# Patient Record
Sex: Male | Born: 1940 | Race: White | Hispanic: No | Smoking: Never smoker
Health system: Southern US, Community
[De-identification: ages and names within clinical notes are randomized; demographics above are authoritative.]

## PROBLEM LIST (undated history)

## (undated) DIAGNOSIS — C61 Malignant neoplasm of prostate: Secondary | ICD-10-CM

## (undated) DIAGNOSIS — M899 Disorder of bone, unspecified: Secondary | ICD-10-CM

## (undated) DIAGNOSIS — H269 Unspecified cataract: Secondary | ICD-10-CM

## (undated) DIAGNOSIS — R0781 Pleurodynia: Secondary | ICD-10-CM

## (undated) DIAGNOSIS — E119 Type 2 diabetes mellitus without complications: Secondary | ICD-10-CM

## (undated) DIAGNOSIS — C801 Malignant (primary) neoplasm, unspecified: Secondary | ICD-10-CM

## (undated) HISTORY — PX: PORTACATH PLACEMENT: SHX2246

## (undated) HISTORY — PX: OTHER SURGICAL HISTORY: SHX169

## (undated) HISTORY — PX: SPLENECTOMY: SUR1306

## (undated) HISTORY — PX: CHOLECYSTECTOMY: SHX55

---

## 2012-02-05 LAB — AMB POC URINALYSIS DIP STICK AUTO W/O MICRO
Bilirubin (UA POC): NEGATIVE
Blood (UA POC): NEGATIVE
Glucose (UA POC): NEGATIVE
Ketones (UA POC): NEGATIVE
Nitrites (UA POC): NEGATIVE
Protein (UA POC): NEGATIVE mg/dL
Specific gravity (UA POC): 1.02 (ref 1.001–1.035)
Urobilinogen (UA POC): 0.2 (ref 0.2–1)
pH (UA POC): 5.5 (ref 4.6–8.0)

## 2012-02-05 LAB — AMB POC PVR, MEAS,POST-VOID RES,US,NON-IMAGING: PVR: 26 cc

## 2012-02-05 NOTE — Addendum Note (Signed)
Addended by: [name] on: 02/05/2012 02:43 PM     Modules accepted: Orders

## 2012-02-05 NOTE — Progress Notes (Signed)
CC: Elevated PSA    HPI:     John Higgins is a 71 y.o. male who was referred by [facility] for evaluation of an elevated PSA at 5.0. Last PSA levels were normal per patient. There may be a family history of prostate cancer but not certain. The patient denies frequency, urgency, dysuria, hematuria or fever/chills. No weight loss or bone pain.       AUA Assessment Score:  AUA Score: 12 ;    AUA Bother Rating: Mixed-about equally satisfied      No past medical history on file.    No past surgical history on file.     Current Outpatient Prescriptions   Medication Sig Dispense Refill   ??? LEVOTHYROXINE SODIUM (LEVOTHYROXINE PO) Take  by mouth.       ??? losartan (COZAAR) 50 mg tablet Take  by mouth daily.       ??? bisoprolol-hydrochlorothiazide (ZIAC) 5-6.25 mg per tablet Take 1 Tab by mouth daily.       ??? sitaGLIPtin-metFORMIN (JANUMET) 50-500 mg per tablet Take 1 Tab by mouth two (2) times daily (with meals).       ??? aspirin 81 mg tablet Take 81 mg by mouth.           Allergies:  No Known Allergies      Past History and ROS:  I reviewed the past medical, surgical and social history as well as ROS in detail. This assessment form has been scanned and is available to view in media.    PE:  Ht 5\' 9"  (1.753 m)   Wt 175 lb (79.379 kg)   BMI 25.84 kg/m2  Constitutional: Well developed, well nourished in no acute distress.  CV:  No peripheral swelling noted.  Respiratory: No respiratory distress or difficulties breathing.  GI: No abdominal masses or tenderness noted. No inguinal hernia appreciated.  GU Male:     Prostate: Perineum normal. Sphincter with good tone, Rectum with no hemo  rrhoids, fissures or masses, Prostate smooth, symmetric, moderate in size and seminal vesicles normal to palpation.  Scrotum:  No scrotal rash or lesions noted.  Normal bilateral testes and epididymii.   Penis: Urethral meatus normal in location and size. No urethral discharge.  Skin: Normal color and texture and No rashes or erythema noted   Neuro/Psych:  Alert and Oriented x3. Affect appropriate.   Lymphatic:   No enlarged inguinal lymph nodes.           LAB:    Results for orders placed in visit on 02/05/12   AMB POC URINALYSIS DIP STICK AUTO W/O MICRO       Component Value Range    Color Yellow  (none)    Clarity Clear  (none)    Glucose Negative  (none)    Bilirubin Negative  (none)    Ketones Negative  (none)    Spec.Grav. 1.020  1.001 - 1.035    Blood Negative  (none)    pH 5.5  4.6 - 8.0    Protein, Urine Negative  Negative mg/dL    Urobilinogen 0.2 mg/dL  0.2 - 1    Nitrites Negative  (none)    Leukocyte esterase Trace  (none)   AMB POC PVR, MEAS,POST-VOID RES,US,NON-IMAGING       Component Value Range    PVR 26             IMP:      1. PSA elevation    2. Incomplete bladder  emptying    3. BPH (benign prostatic hyperplasia)              SUMMARY STATEMENT AND PLAN:     ?? Repeat PSA today  ?? BPH and is on Flomax but does not like side effects  ?? Not greatly bothered by symptoms now and will stop medication for BPH          Ariam Mol A. Letitia Neri, MD, FACS

## 2012-02-06 LAB — PSA, DIAGNOSTIC (PROSTATE SPECIFIC AG): Prostate Specific Ag: 5.1 ng/mL — AB (ref 0.00–4.00)

## 2012-02-07 MED ORDER — CIPROFLOXACIN 500 MG TAB
500 mg | ORAL_TABLET | ORAL | Status: DC
Start: 2012-02-07 — End: 2015-04-19

## 2012-02-07 NOTE — Progress Notes (Signed)
Quick Note:    Please inform PSA remains elevated at 5.1. Recommend biopsy as had discussed.  ______

## 2012-02-07 NOTE — Telephone Encounter (Signed)
Called patient and informed him of his PSA results, scheduled him for a TRUS Bx, meds ordered, instructions given and paperwork mailed to his address on file.  [name]

## 2012-02-28 LAB — AMB POC URINALYSIS DIP STICK AUTO W/O MICRO
Bilirubin (UA POC): NEGATIVE
Blood (UA POC): NEGATIVE
Ketones (UA POC): NEGATIVE
Leukocyte esterase (UA POC): NEGATIVE
Nitrites (UA POC): NEGATIVE
Protein (UA POC): NEGATIVE mg/dL
Specific gravity (UA POC): 1.03 (ref 1.001–1.035)
Urobilinogen (UA POC): 0.2 (ref 0.2–1)
pH (UA POC): 5 (ref 4.6–8.0)

## 2012-02-28 MED ORDER — GENTAMICIN 40 MG/ML IJ SOLN
40 mg/mL | Freq: Once | INTRAMUSCULAR | Status: AC
Start: 2012-02-28 — End: 2012-02-28

## 2012-03-01 NOTE — Progress Notes (Signed)
Transrectal Ultrasound and Biopsy of Prostate        Patient: John Higgins                                                             Date: 03/01/2012    Indications: Elevated PSA    Procedure:  The patient is set for a prostate biopsy. We have reviewed the indications and risks and benefits and options in great detail. He elects to proceed and accepts each of these. These include but are not limited to bleeding, infection, sepsis, death, prolonged hematuria and/or hematospermia, erectile dysfunction as well as others. All questions were answered and he agrees to proceed.  Stated he took po Cipro, enema, had IM Gentamicin and not on blood thinners or ASA.    He was placed in the left lateral decubitus position with knees drawn up. Anesthesia: Prostate Block - 2% Xylocaine 10cc was injected bilaterally and 2% Xylocaine jelly per rectum    An endorectal probe was placed in the rectum.  Transverse and sagittal images of the prostate and seminal vesicles were obtained.  Using the needle guide on the probe, a biopsy needle was used to obtain biopsies of the prostate.  If seen, abnormally appearing areas were targeted along with systematic biopsies from each side.  Tissue cores were placed in formalin jars and sent for pathologic review.    The probe was removed and the patient was observed.  He was advised to finish his prescribed antibiotics and avoid strenuous physical and sexual activity for several days.  He was told to call our office if he developed fever, excessive or persistent blood in the urine/stool, or difficulty voiding.  We arranged follow-up so that we could review the results of this biopsy.    Interpretation:  Prostate Exam: 1+ (30gms)  Prostatic Volume: 42.3 grams  Prostatic capsule: Normal  Transition Zone: normal  Peripheral Zone: normal  Seminal Vesicles: Normal    Biopsy-Number of cores:              Base Mid Apex Lat Base Lat Mid Lat Apex   Right Side 1 1 1 1 1 1    Left Side 1 1 1 1 1 1                                Patriciaann Rabanal A. Letitia Neri, MD, FACS

## 2012-03-05 NOTE — Progress Notes (Signed)
Quick Note:    Please inform no cancer on biopsy. Should come in in 6 months again.  ______

## 2012-03-06 NOTE — Telephone Encounter (Signed)
Called patient and infomed him of his Bx results also told him to follow up in 6 months.  [name]

## 2015-04-19 ENCOUNTER — Ambulatory Visit: Admit: 2015-04-19 | Discharge: 2015-04-19 | Payer: MEDICARE | Attending: Urology | Primary: Family Medicine

## 2015-04-19 DIAGNOSIS — R972 Elevated prostate specific antigen [PSA]: Secondary | ICD-10-CM

## 2015-04-19 LAB — AMB POC URINALYSIS DIP STICK AUTO W/O MICRO
Bilirubin (UA POC): NEGATIVE
Blood (UA POC): NEGATIVE
Glucose (UA POC): NEGATIVE
Ketones (UA POC): NEGATIVE
Leukocyte esterase (UA POC): NEGATIVE
Nitrites (UA POC): NEGATIVE
Protein (UA POC): NEGATIVE mg/dL
Specific gravity (UA POC): 1.025 (ref 1.001–1.035)
Urobilinogen (UA POC): 0.2 (ref 0.2–1)
pH (UA POC): 5.5 (ref 4.6–8.0)

## 2015-04-19 LAB — PSA (TOTAL, REFLEX TO FREE): Prostate Specific Ag: 6.09 ng/mL — ABNORMAL HIGH (ref 0.00–4.00)

## 2015-04-19 LAB — PSA (FREE)
PSA, % Free: 24 %
PSA, Free: 1.49 ng/mL

## 2015-04-19 NOTE — Progress Notes (Signed)
Impression:    ICD-10-CM ICD-9-CM    1. Elevated PSA R97.2 790.93 AMB POC URINALYSIS DIP STICK AUTO W/O MICRO      PR COLLECTION VENOUS BLOOD,VENIPUNCTURE      PSA (TOTAL, REFLEX TO FREE)      CANCELED: PROSTATE SPECIFIC ANTIGEN, TOTAL (PSA)       Elevated PSA s/p negative biopsy by Dr Maida Sale in 2013 when PSA 5.1. Prostate vol 42.3 gms. PSA now 7.1 per referral note, do not have actual records  Significant CAD history - CABG 2005, multiple coronary stents most recently 05/2014, managed by Dr Jerline Pain  TIA 2013  DM  HTN    Plan:  No recent PSAs to review, repeat PSA today.  If PSA stable and DRE benign, recommend observation and follow up in 1 year    We reviewed the implications of an elevated PSA and the uncertainty surrounding it. In general, a man's PSA increases with age and is produced by both normal and cancerous prostate tissue. An elevated PSA can be a manifestation of one or more of the following items: prostate enlargement, prostate infection, or prostate cancer. Management of an elevated PSA can include observation or prostate biopsy and we discussed this in detail.      Chief Complaint   Patient presents with   ??? Elevated PSA       HPI: Pt returns to the office for f/u of elevated PSA. Referral form noted PSA of 7.1.  He had a previous prostate biopsy in 2013 by Dr Maida Sale for PSA of 5.1.  Biopsy was negative, one area of inflammation, volume 42gms.   No known FH of prostate cancer.  He takes Flomax for weak stream with improvement.    He denies dysuria, hematuria, changes in bowel habits or abdominal/back/bone pain.     Past Medical History   Diagnosis Date   ??? HTN (hypertension)    ??? CAD (coronary artery disease) 2005, 2015     CABG 2005, multiple stents, most recently in 05/2014   ??? TIA (transient ischemic attack) 2013   ??? DM (diabetes mellitus screen)    ??? Liver disorder        History reviewed. No pertinent past surgical history.    No Known Allergies      Current outpatient prescriptions:    ???  gabapentin (NEURONTIN) 300 mg capsule, , Disp: , Rfl:   ???  glimepiride (AMARYL) 1 mg tablet, , Disp: , Rfl:   ???  simvastatin (ZOCOR) 40 mg tablet, , Disp: , Rfl:   ???  tamsulosin (FLOMAX) 0.4 mg capsule, , Disp: , Rfl:   ???  clopidogrel (PLAVIX) 75 mg tablet, , Disp: , Rfl:   ???  METFORMIN HCL (METFORMIN PO), Take  by mouth., Disp: , Rfl:   ???  LEVOTHYROXINE SODIUM (LEVOTHYROXINE PO), Take  by mouth., Disp: , Rfl:   ???  losartan (COZAAR) 50 mg tablet, Take  by mouth daily., Disp: , Rfl:   ???  bisoprolol-hydrochlorothiazide (ZIAC) 5-6.25 mg per tablet, Take 1 Tab by mouth daily., Disp: , Rfl:   ???  aspirin 81 mg tablet, Take 81 mg by mouth., Disp: , Rfl:     History reviewed. No pertinent family history.  History     Social History   ??? Marital Status: SINGLE     Spouse Name: N/A   ??? Number of Children: N/A   ??? Years of Education: N/A     Social History Main Topics   ??? Smoking  status: Never Smoker    ??? Smokeless tobacco: Never Used   ??? Alcohol Use: Yes   ??? Drug Use: Not on file   ??? Sexual Activity: Not on file     Other Topics Concern   ??? None     Social History Narrative         ROS:  Review of Systems  Constitutional: Fever: No  Skin: Rash: No  HEENT: Hearing difficulty: No  Eyes: Blurred vision: No  Cardiovascular: Chest pain: No  Respiratory: Shortness of breath: No  Gastrointestinal: Nausea/vomiting: No  Musculoskeletal: Back pain: No  Neurological: Weakness: No  Psychological: Memory loss: No  Comments/additional findings:     PE:  BP 120/70 mmHg   Ht 5\' 9"  (1.753 m)   Wt 165 lb (74.844 kg)   BMI 24.36 kg/m2  Gen: WNWD, NAD  Resp: Symmetrical effort, non-labored  CV: No peripheral edema  GU: DRE exam: Prostate 2+/4, smooth, symmetrical, anodular  Skin: no jaundice  Ext: warm and perfused  Neuro: A&Ox3    Results for orders placed or performed in visit on 04/19/15   AMB POC URINALYSIS DIP STICK AUTO W/O MICRO   Result Value Ref Range    Color (UA POC)      Clarity (UA POC)      Glucose (UA POC) Negative Negative     Bilirubin (UA POC) Negative Negative    Ketones (UA POC) Negative Negative    Specific gravity (UA POC) 1.025 1.001 - 1.035    Blood (UA POC) Negative Negative    pH (UA POC) 5.5 4.6 - 8.0    Protein (UA POC) Negative Negative mg/dL    Urobilinogen (UA POC) 0.2 mg/dL 0.2 - 1    Nitrites (UA POC) Negative Negative    Leukocyte esterase (UA POC) Negative Negative       Charlene Reyes PA-C    I was present during visit.  I performed the pertinent parts of the exam and they are as documented above.  We  discussed the assessment and plan,  and I concur with plan.    Herbie [place] Charlene Detter MD   Urologic Oncologist  [facility] Hermelinda [facility]us MD Professorship  Associate Professor of Urology  [facility]  Office [phone] Ext [phone]

## 2015-04-26 NOTE — Progress Notes (Signed)
Quick Note:        Letter sent        ______

## 2015-08-11 NOTE — Other (Signed)
[place]    How to prepare:    Nothing to eat or drink after midnight the night before surgery, unless otherwise specified.  This includes gum and mints.  You may brush your teeth in the morning, however do not swallow any water.  Please remove all jewelry and body piercings and leave all valuables at home.  Remove nail polish and contact lenses.  You MUST make arrangements for a responsible adult to take you home after your surgery, analgesia or sedation.  It is strongly suggested that a responsible adult stay with you during the first 24 hours.  Please follow your surgeon's instructions regarding the time you need to arrive at [facility].  If you are late [place] due to any unforeseen circumstance or your are ill the morning of surgery and need to cancel or be delayed, please call [facility] at [phone] or [phone]  If you need to cancel your surgery prior to the day of surgery, call your surgeon.      What to bring with you:    Paperwork from your doctor's office that you have been given.  Insurance cards, a picture ID and a method to pay your insurance co-pay.  Wear comfortable, loose fitting clothing that will be easy for you to put back on after surgery.  If you are having cataract surgery, please bring all the eye drops that you have been given by your surgeon, including our antibiotic ointment/drops.  Wear warm clothes from waist down, they will be left on.  Diabetics, please check your blood sugar on the day of surgery, just prior to coming to [facility] and report the results to per-operative nurse.  If you have insulin, do not take it but please bring it with you.  Hold Glucophage, Metformin, Glucovance for 24 hours. Prior to surgery.  DO NOT take any diabetic medications the morning of surgery.      [facility]          I have reviewed the above instructions:               Patient: John Higgins  Date:     August 11, 2015 Time:   3:21 PM       RN:  Barton Fanny, RN  Date:     August 11, 2015 Time:   3:21 PM

## 2015-08-16 ENCOUNTER — Inpatient Hospital Stay: Payer: MEDICARE

## 2015-08-16 MED ORDER — MOXIFLOXACIN 0.1% INTRACAMERAL INJECTION
0.1 % | OPHTHALMIC | Status: DC | PRN
Start: 2015-08-16 — End: 2015-08-16
  Administered 2015-08-16: 17:00:00 via OPHTHALMIC

## 2015-08-16 MED ORDER — PHENYLEPHRINE 10 % EYE DROPS
10 % | Freq: Once | OPHTHALMIC | Status: DC | PRN
Start: 2015-08-16 — End: 2015-08-16

## 2015-08-16 MED ORDER — LIDOCAINE 1% - PHENYLEPHRINE 1.5% 1 ML OPTHALMIC INJECTION
INTRAOCULAR | Status: DC | PRN
Start: 2015-08-16 — End: 2015-08-16
  Administered 2015-08-16: 17:00:00 via INTRAOCULAR

## 2015-08-16 MED ORDER — MOXIFLOXACIN 0.5 % EYE DROPS
0.5 % | OPHTHALMIC | Status: AC
Start: 2015-08-16 — End: 2015-08-16
  Administered 2015-08-16 (×2): via OPHTHALMIC

## 2015-08-16 MED ORDER — ACETAZOLAMIDE 250 MG TAB
250 mg | Freq: Once | ORAL | Status: AC
Start: 2015-08-16 — End: 2015-08-16
  Administered 2015-08-16: 17:00:00 via ORAL

## 2015-08-16 MED ORDER — SODIUM CHLORIDE 0.9 % IV
INTRAVENOUS | Status: DC
Start: 2015-08-16 — End: 2015-08-16
  Administered 2015-08-16: 16:00:00 via INTRAVENOUS

## 2015-08-16 MED ORDER — MIDAZOLAM 1 MG/ML IJ SOLN
1 mg/mL | INTRAMUSCULAR | Status: DC | PRN
Start: 2015-08-16 — End: 2015-08-16
  Administered 2015-08-16 (×3): via INTRAVENOUS

## 2015-08-16 MED ORDER — PROPARACAINE 0.5 % EYE DROPS
0.5 % | OPHTHALMIC | Status: DC | PRN
Start: 2015-08-16 — End: 2015-08-16
  Administered 2015-08-16: 16:00:00 via OPHTHALMIC

## 2015-08-16 MED ORDER — PHENYLEPHRINE 2.5 % EYE DROPS
2.5 % | OPHTHALMIC | Status: AC
Start: 2015-08-16 — End: 2015-08-16
  Administered 2015-08-16 (×2): via OPHTHALMIC

## 2015-08-16 MED ORDER — PROPARACAINE 0.5 % EYE DROPS
0.5 % | Freq: Once | OPHTHALMIC | Status: AC
Start: 2015-08-16 — End: 2015-08-16
  Administered 2015-08-16: 16:00:00 via OPHTHALMIC

## 2015-08-16 MED ORDER — BALANCED SALT IRRIG SOLN COMB2 INTRAOCULAR
INTRAOCULAR | Status: DC | PRN
Start: 2015-08-16 — End: 2015-08-16
  Administered 2015-08-16: 17:00:00 via INTRAOCULAR

## 2015-08-16 MED ORDER — CHONDROITIN-SOD HYALURON 3 %-4 % (0.5 ML) 1 %(0.55 ML) INTRAOCULAR KIT
3 %-4 %(0.5 mL) 1 % (0.55 mL) | INTRAOCULAR | Status: DC | PRN
Start: 2015-08-16 — End: 2015-08-16
  Administered 2015-08-16: 17:00:00 via INTRAOCULAR

## 2015-08-16 MED ORDER — TROPICAMIDE 1 % EYE DROPS
1 % | OPHTHALMIC | Status: AC
Start: 2015-08-16 — End: 2015-08-16
  Administered 2015-08-16 (×2): via OPHTHALMIC

## 2015-08-16 MED ORDER — ACETAMINOPHEN 500 MG TAB
500 mg | ORAL | Status: DC | PRN
Start: 2015-08-16 — End: 2015-08-16

## 2015-08-16 MED ORDER — MIDAZOLAM 1 MG/ML IJ SOLN
1 mg/mL | INTRAMUSCULAR | Status: DC | PRN
Start: 2015-08-16 — End: 2015-08-16

## 2015-08-16 MED ORDER — POVIDONE-IODINE 5 % EYE SOLN
5 % | OPHTHALMIC | Status: DC | PRN
Start: 2015-08-16 — End: 2015-08-16
  Administered 2015-08-16: 16:00:00 via OPHTHALMIC

## 2015-08-16 MED ORDER — PREDNISOLONE ACETATE 1 % EYE DROPS, SUSP
1 % | OPHTHALMIC | Status: AC
Start: 2015-08-16 — End: 2015-08-16
  Administered 2015-08-16 (×2): via OPHTHALMIC

## 2015-08-16 MED FILL — ACETAZOLAMIDE 250 MG TAB: 250 mg | ORAL | Qty: 1

## 2015-08-16 MED FILL — PHENYLEPHRINE 2.5 % EYE DROPS: 2.5 % | OPHTHALMIC | Qty: 15

## 2015-08-16 MED FILL — PREDNISOLONE ACETATE 1 % EYE DROPS, SUSP: 1 % | OPHTHALMIC | Qty: 5

## 2015-08-16 MED FILL — MIDAZOLAM 1 MG/ML IJ SOLN: 1 mg/mL | INTRAMUSCULAR | Qty: 1

## 2015-08-16 MED FILL — MAPAP EXTRA STRENGTH 500 MG TABLET: 500 mg | ORAL | Qty: 1

## 2015-08-16 MED FILL — TROPICAMIDE 1 % EYE DROPS: 1 % | OPHTHALMIC | Qty: 15

## 2015-08-16 MED FILL — PROPARACAINE 0.5 % EYE DROPS: 0.5 % | OPHTHALMIC | Qty: 15

## 2015-08-16 MED FILL — SODIUM CHLORIDE 0.9 % IV: INTRAVENOUS | Qty: 1000

## 2015-08-16 MED FILL — PHENYLEPHRINE 10 % EYE DROPS: 10 % | OPHTHALMIC | Qty: 5

## 2015-08-16 MED FILL — VIGAMOX 0.5 % EYE DROPS: 0.5 % | OPHTHALMIC | Qty: 3

## 2015-08-16 NOTE — Anesthesia Post-Procedure Evaluation (Signed)
Post-Anesthesia Evaluation and Assessment    Patient: John Higgins MRN: [id number]  SSN: [id number]    Date of Birth: 05-Oct-1941  Age: 74 y.o.  Sex: male       Cardiovascular Function/Vital Signs  Visit Vitals   ??? BP 119/64   ??? Pulse (!) 54   ??? Resp 18   ??? Ht 5\' 9"  (1.753 m)   ??? Wt 74.8 kg (165 lb)   ??? SpO2 97%   ??? BMI 24.37 kg/m2       Patient is status post MAC anesthesia for Procedure(s):  LEFT EYE CATARACT EXTRACTION WITH INTRA OCULAR LENS IMPLANT.    Nausea/Vomiting: None    Postoperative hydration reviewed and adequate.    Pain:  Pain Scale 1: Numeric (0 - 10) (08/16/15 1248)  Pain Intensity 1: 0 (08/16/15 1248)   Managed    Neurological Status:       At baseline    Mental Status and Level of Consciousness: Arousable    Pulmonary Status:   O2 Device: Room air (08/16/15 1248)   Adequate oxygenation and airway patent    Complications related to anesthesia: None    Post-anesthesia assessment completed. No concerns    Signed By: Renae Fickle, MD     August 16, 2015

## 2015-08-16 NOTE — Anesthesia Pre-Procedure Evaluation (Signed)
Anesthetic History   No history of anesthetic complications            Review of Systems / Medical History  Patient summary reviewed, nursing notes reviewed and pertinent labs reviewed    Pulmonary  Within defined limits                 Neuro/Psych   Within defined limits        Pertinent negatives: No TIA and CVA  Comments: neuropathy Cardiovascular    Hypertension: well controlled          CAD, cardiac stents and CABG    Exercise tolerance:: CABG 2005  Stent one year     GI/Hepatic/Renal     GERD: well controlled        Pertinent negatives: No liver disease   Endo/Other    Diabetes: well controlled  Hypothyroidism: well controlled       Other Findings              Physical Exam    Airway  Mallampati: II  TM Distance: 4 - 6 cm  Neck ROM: normal range of motion   Mouth opening: Normal     Cardiovascular  Regular rate and rhythm,  S1 and S2 normal,  no murmur, click, rub, or gallop             Dental    Dentition: Full lower dentures and Full upper dentures     Pulmonary  Breath sounds clear to auscultation               Abdominal  GI exam deferred       Other Findings            Anesthetic Plan    ASA: 3  Anesthesia type: MAC          Induction: Intravenous  Anesthetic plan and risks discussed with: Patient

## 2015-08-16 NOTE — Op Note (Signed)
Operative Note      Patient: John Higgins MRN: [id number]  SSN: [id number]    Date of Birth: 02-Feb-1941  Age: 74 y.o.  Sex: male      Date of Surgery: 08/16/2015    Preoperative Diagnosis: Cataract - visually significant left eye    Postoperative Diagnosis:  Cataract - visually significant left eye    Procedure: Phacoemulsification and insertion of intraocular lens left eye    Anesthesia:  Topical with monitored anesthesia care    Surgeon:  Carey Bullocks. Pollyann Glen, M.D.    Description:    After obtaining proper informed consent, the operative eye was dilated and the patient was brought to the surgery suite and placed in the supine position.  The operative eye was then prepped and draped in the standard surgical fashion.  A paracentesis was then placed at the approximate 1:00 position and the eye was filled with 1.0% preservative-free Xylocaine.  Viscoelastic was then injected through the paracentesis site and a 2.4 keratome was passed through clear cornea at the approximate 9:30 position.  A bent needle cystatome was then used to create a continuous curvilinear capsulorhexis.  Hydrodisection and hydrodemarcation was then performed followed by phacoemulsification of the nucleus via a four-quadrant divide and conquer technique.  Irrigation and aspiration was then used to remove the remaining cortex material and viscoelastic was injected into the capsular bag.  A SA60WF 24.0 lens was then injected into the capsular bag with the trailing haptic spun into place without difficulty.  Irrigation and aspiration was then used to remove the remaining viscoelastic. The clear corneal wound was hydrated at the stromal level and when checked for a leak revealed none.  0.1 mL of Moxifloxacin was injected into the anterior chamber.  The patient returned to the recovery area awake and in stable condition.  The patient will follow up tomorrow for further evaluation.      Findings:  Cataract     Limbal Relaxing Incision (LRI):  NO      Miostat / Miochol Administered:  NO    Specimens:  None    Assistants:  None    Complications:  None    Blood Loss:  Zero        Carey Bullocks. Pollyann Glen, M.D.  August 16, 2015  12:53 PM

## 2015-08-29 NOTE — Anesthesia Pre-Procedure Evaluation (Addendum)
Anesthetic History   No history of anesthetic complications            Review of Systems / Medical History  Patient summary reviewed, nursing notes reviewed and pertinent labs reviewed    Pulmonary  Within defined limits                 Neuro/Psych   Within defined limits        Pertinent negatives: No TIA and CVA  Comments: neuropathy Cardiovascular    Hypertension: well controlled          CAD, cardiac stents and CABG    Exercise tolerance: >4 METS: CABG 2005  Stent one year     GI/Hepatic/Renal     GERD: well controlled        Pertinent negatives: No liver disease   Endo/Other    Diabetes: well controlled  Hypothyroidism: well controlled       Other Findings            Physical Exam    Airway  Mallampati: II  TM Distance: 4 - 6 cm  Neck ROM: normal range of motion   Mouth opening: Normal     Cardiovascular  Regular rate and rhythm,  S1 and S2 normal,  no murmur, click, rub, or gallop             Dental    Dentition: Full lower dentures and Full upper dentures     Pulmonary  Breath sounds clear to auscultation               Abdominal  GI exam deferred       Other Findings            Anesthetic Plan    ASA: 3  Anesthesia type: MAC          Induction: Intravenous  Anesthetic plan and risks discussed with: Patient

## 2015-08-29 NOTE — Other (Signed)
[place]    How to prepare:    Nothing to eat or drink after midnight the night before surgery, unless otherwise specified.  This includes gum and mints.  You may brush your teeth in the morning, however do not swallow any water.  Do not take any medication the morning of your procedure without your physician's approval.  Please speak with the Pre-Admission testing nurse at [phone] regarding specific instructions about your medications.  Please remove all jewelry and body piercings and leave all valuables at home.  Remove nail polish and contact lenses.  You MUST make arrangements for a responsible adult to take you home after your surgery, analgesia or sedation.  It is strongly suggested that a responsible adult stay with you during the first 24 hours.  Please follow your surgeon's instructions regarding the time you need to arrive at [facility].  If you are late [place] due to any unforeseen circumstance or your are ill the morning of surgery and need to cancel or be delayed, please call [facility] at [phone] or [phone]  If you need to cancel your surgery prior to the day of surgery, call your surgeon.      What to bring with you:    Paperwork from your doctor's office that you have been given.  Insurance cards, a picture ID and a method to pay your insurance co-pay.  Wear comfortable, loose fitting clothing that will be easy for you to put back on after surgery.  If you are having cataract surgery, please bring all the eye drops that you have been given by your surgeon, including our antibiotic ointment/drops.  Wear warm clothes from waist down, they will be left on.  Diabetics, please check your blood sugar on the day of surgery, just prior to coming to [facility] and report the results to per-operative nurse.  If you have insulin, do not take it but please bring it with you.   Hold Glucophage, Metformin, Glucovance for 24 hours. Prior to surgery.  DO NOT take any diabetic medications the morning of surgery.        I have reviewed the above instructions:              Patient: John Higgins  Date:     August 29, 2015 Time:   9:26 AM       RN:  Venancio Poisson, LPN  Date:     August 29, 2015 Time:   9:26 AM

## 2015-08-30 ENCOUNTER — Inpatient Hospital Stay: Payer: MEDICARE

## 2015-08-30 MED ORDER — PHENYLEPHRINE 10 % EYE DROPS
10 % | Freq: Once | OPHTHALMIC | Status: DC | PRN
Start: 2015-08-30 — End: 2015-08-30

## 2015-08-30 MED ORDER — MOXIFLOXACIN 0.1% INTRACAMERAL INJECTION
0.1 % | OPHTHALMIC | Status: DC | PRN
Start: 2015-08-30 — End: 2015-08-30
  Administered 2015-08-30: 16:00:00

## 2015-08-30 MED ORDER — MIDAZOLAM 1 MG/ML IJ SOLN
1 mg/mL | INTRAMUSCULAR | Status: DC | PRN
Start: 2015-08-30 — End: 2015-08-30
  Administered 2015-08-30 (×3): via INTRAVENOUS

## 2015-08-30 MED ORDER — ACETAMINOPHEN 500 MG TAB
500 mg | ORAL | Status: DC | PRN
Start: 2015-08-30 — End: 2015-08-30

## 2015-08-30 MED ORDER — SODIUM CHLORIDE 0.9 % IV
INTRAVENOUS | Status: DC
Start: 2015-08-30 — End: 2015-08-30
  Administered 2015-08-30: 15:00:00 via INTRAVENOUS

## 2015-08-30 MED ORDER — TROPICAMIDE 1 % EYE DROPS
1 % | OPHTHALMIC | Status: AC
Start: 2015-08-30 — End: 2015-08-30
  Administered 2015-08-30 (×2): via OPHTHALMIC

## 2015-08-30 MED ORDER — PREDNISOLONE ACETATE 1 % EYE DROPS, SUSP
1 % | OPHTHALMIC | Status: AC
Start: 2015-08-30 — End: 2015-08-30
  Administered 2015-08-30 (×2): via OPHTHALMIC

## 2015-08-30 MED ORDER — POVIDONE-IODINE 5 % EYE SOLN
5 % | OPHTHALMIC | Status: DC | PRN
Start: 2015-08-30 — End: 2015-08-30
  Administered 2015-08-30: 16:00:00 via OPHTHALMIC

## 2015-08-30 MED ORDER — BALANCED SALT IRRIG SOLN COMB2 INTRAOCULAR
INTRAOCULAR | Status: DC | PRN
Start: 2015-08-30 — End: 2015-08-30
  Administered 2015-08-30: 16:00:00 via INTRAOCULAR

## 2015-08-30 MED ORDER — PROPARACAINE 0.5 % EYE DROPS
0.5 % | OPHTHALMIC | Status: DC | PRN
Start: 2015-08-30 — End: 2015-08-30
  Administered 2015-08-30: 16:00:00 via OPHTHALMIC

## 2015-08-30 MED ORDER — MIDAZOLAM 1 MG/ML IJ SOLN
1 mg/mL | INTRAMUSCULAR | Status: DC | PRN
Start: 2015-08-30 — End: 2015-08-30

## 2015-08-30 MED ORDER — PROPARACAINE 0.5 % EYE DROPS
0.5 % | Freq: Once | OPHTHALMIC | Status: AC
Start: 2015-08-30 — End: 2015-08-30
  Administered 2015-08-30: 15:00:00 via OPHTHALMIC

## 2015-08-30 MED ORDER — LIDOCAINE 1% - PHENYLEPHRINE 1.5% 1 ML OPTHALMIC INJECTION
INTRAOCULAR | Status: DC | PRN
Start: 2015-08-30 — End: 2015-08-30
  Administered 2015-08-30: 16:00:00 via INTRAOCULAR

## 2015-08-30 MED ORDER — ONDANSETRON (PF) 4 MG/2 ML INJECTION
4 mg/2 mL | Freq: Once | INTRAMUSCULAR | Status: DC | PRN
Start: 2015-08-30 — End: 2015-08-30

## 2015-08-30 MED ORDER — PHENYLEPHRINE 2.5 % EYE DROPS
2.5 % | OPHTHALMIC | Status: AC
Start: 2015-08-30 — End: 2015-08-30
  Administered 2015-08-30 (×2): via OPHTHALMIC

## 2015-08-30 MED ORDER — ACETAZOLAMIDE 250 MG TAB
250 mg | Freq: Once | ORAL | Status: AC
Start: 2015-08-30 — End: 2015-08-30
  Administered 2015-08-30: 17:00:00 via ORAL

## 2015-08-30 MED ORDER — CHONDROITIN-SOD HYALURON 3 %-4 % (0.5 ML) 1 %(0.55 ML) INTRAOCULAR KIT
3 %-4 %(0.5 mL) 1 % (0.55 mL) | INTRAOCULAR | Status: DC | PRN
Start: 2015-08-30 — End: 2015-08-30
  Administered 2015-08-30: 16:00:00 via INTRAOCULAR

## 2015-08-30 MED ORDER — MOXIFLOXACIN 0.5 % EYE DROPS
0.5 % | OPHTHALMIC | Status: AC
Start: 2015-08-30 — End: 2015-08-30
  Administered 2015-08-30 (×2): via OPHTHALMIC

## 2015-08-30 MED FILL — MAPAP EXTRA STRENGTH 500 MG TABLET: 500 mg | ORAL | Qty: 1

## 2015-08-30 MED FILL — PHENYLEPHRINE 10 % EYE DROPS: 10 % | OPHTHALMIC | Qty: 5

## 2015-08-30 MED FILL — ACETAZOLAMIDE 250 MG TAB: 250 mg | ORAL | Qty: 1

## 2015-08-30 MED FILL — VIGAMOX 0.5 % EYE DROPS: 0.5 % | OPHTHALMIC | Qty: 3

## 2015-08-30 MED FILL — MIDAZOLAM 1 MG/ML IJ SOLN: 1 mg/mL | INTRAMUSCULAR | Qty: 1

## 2015-08-30 MED FILL — ONDANSETRON (PF) 4 MG/2 ML INJECTION: 4 mg/2 mL | INTRAMUSCULAR | Qty: 2

## 2015-08-30 MED FILL — SODIUM CHLORIDE 0.9 % IV: INTRAVENOUS | Qty: 1000

## 2015-08-30 MED FILL — TROPICAMIDE 1 % EYE DROPS: 1 % | OPHTHALMIC | Qty: 15

## 2015-08-30 MED FILL — PROPARACAINE 0.5 % EYE DROPS: 0.5 % | OPHTHALMIC | Qty: 15

## 2015-08-30 MED FILL — PREDNISOLONE ACETATE 1 % EYE DROPS, SUSP: 1 % | OPHTHALMIC | Qty: 5

## 2015-08-30 MED FILL — PHENYLEPHRINE 2.5 % EYE DROPS: 2.5 % | OPHTHALMIC | Qty: 15

## 2015-08-30 NOTE — Op Note (Signed)
Operative Note      Patient: John Higgins MRN: [id number]  SSN: [id number]    Date of Birth: [birth year]  Age: 74 y.o.  Sex: male      Date of Surgery: 08/30/2015    Preoperative Diagnosis: Cataract - visually significant right eye    Postoperative Diagnosis:  Cataract - visually significant right eye    Procedure: Phacoemulsification and insertion of intraocular lens right eye    Anesthesia:  Topical with monitored anesthesia care    Surgeon:  Carey Bullocks. Pollyann Glen, M.D.    Description:    After obtaining proper informed consent, the operative eye was dilated and the patient was brought to the surgery suite and placed in the supine position.  The operative eye was then prepped and draped in the standard surgical fashion.  A paracentesis was then placed at the approximate 2:00 position and the eye was filled with 1.0% preservative-free Xylocaine.  Viscoelastic was then injected through the paracentesis site and a 2.4 keratome was passed through clear cornea at the approximate 11:00 position.  A bent needle cystatome was then used to create a continuous curvilinear capsulorhexis.  Hydrodisection and hydrodemarcation was then performed followed by phacoemulsification of the nucleus via a four-quadrant divide and conquer technique.  Irrigation and aspiration was then used to remove the remaining cortex material and viscoelastic was injected into the capsular bag.  A SA60WF 23.5 lens was then injected into the capsular bag with the trailing haptic spun into place without difficulty.  Irrigation and aspiration was then used to remove the remaining viscoelastic. The clear corneal wound was hydrated at the stromal level and when checked for a leak revealed none.  0.1 mL of Moxifloxacin was injected into the anterior chamber.  The patient returned to the recovery area awake and in stable condition.  The patient will follow up tomorrow for further evaluation.      Findings:  Cataract     Limbal Relaxing Incision (LRI):  NO     Miostat / Miochol Administered:  NO    Specimens:  None    Assistants:  None    Complications:  None    Blood Loss:  Zero        Carey Bullocks. Pollyann Glen, M.D.  August 30, 2015  12:37 PM

## 2015-08-30 NOTE — Anesthesia Post-Procedure Evaluation (Signed)
Post-Anesthesia Evaluation and Assessment    Patient: John Higgins MRN: [id number]  SSN: [id number]    Date of Birth: 09/02/1941  Age: 74 y.o.  Sex: male       Cardiovascular Function/Vital Signs  Visit Vitals   ??? BP 117/48   ??? Pulse (!) 57   ??? Temp 36.5 ??C (97.7 ??F)   ??? Resp 16   ??? SpO2 98%       Patient is status post MAC anesthesia for Procedure(s):  CATARACT EXTRACTION WITH INTRA OCULAR LENS IMPLANT.    Nausea/Vomiting: None    Postoperative hydration reviewed and adequate.    Pain:  Pain Scale 1: Numeric (0 - 10) (08/30/15 1107)  Pain Intensity 1: 0 (08/30/15 1107)   Managed    Neurological Status:       At baseline    Mental Status and Level of Consciousness: Arousable    Pulmonary Status:   O2 Device: Room air (08/30/15 1224)   Adequate oxygenation and airway patent    Complications related to anesthesia: None    Post-anesthesia assessment completed. No concerns    Signed By: Charline Bills, MD     August 30, 2015

## 2016-04-15 ENCOUNTER — Ambulatory Visit: Admit: 2016-04-15 | Discharge: 2016-04-15 | Payer: MEDICARE | Attending: Urology | Primary: Family Medicine

## 2016-04-15 DIAGNOSIS — R972 Elevated prostate specific antigen [PSA]: Secondary | ICD-10-CM

## 2016-04-15 LAB — AMB POC URINALYSIS DIP STICK AUTO W/O MICRO
Blood (UA POC): NEGATIVE
Leukocyte esterase (UA POC): NEGATIVE
Nitrites (UA POC): NEGATIVE
Specific gravity (UA POC): 1.02 (ref 1.001–1.035)
Urobilinogen (UA POC): 1 (ref 0.2–1)
pH (UA POC): 5.5 (ref 4.6–8.0)

## 2016-04-15 LAB — PROSTATE SPECIFIC ANTIGEN, TOTAL (PSA): Prostate Specific Ag: 43.03 ng/mL — ABNORMAL HIGH (ref 0.00–4.00)

## 2016-04-15 MED ORDER — LEVOFLOXACIN 750 MG TAB
750 mg | ORAL_TABLET | ORAL | 0 refills | Status: DC
Start: 2016-04-15 — End: 2016-05-28

## 2016-04-15 NOTE — Progress Notes (Signed)
Aundra Dubin presents today for lab draw per Dr. Given order.    Patient will be notified by the physician with lab results.    PSA obtained via venipuncture without any difficulty.      PSA ordered in [place].  Orders Placed This Encounter   ??? PR COLLECTION VENOUS BLOOD,VENIPUNCTURE       Franchot Gallo

## 2016-04-15 NOTE — Progress Notes (Signed)
John Higgins  1941-10-28  04/15/2016    Impression:    ICD-10-CM ICD-9-CM    1. Elevated PSA R97.20 790.93 PR COLLECTION VENOUS BLOOD,VENIPUNCTURE      AMB POC URINALYSIS DIP STICK AUTO W/O MICRO   2. Increased prostate specific antigen (PSA) velocity R97.20 790.93 AMB POC URINALYSIS DIP STICK AUTO W/O MICRO       Elevated PSA s/p negative biopsy by Dr Maida Sale in 2013 when PSA 5.1. Prostate vol 42.3 gms. PSA now 7.1 per referral note, do not have actual records. PSA 12/26/2015 was 10.470 ng/mL. PSA today 04/15/2016 is 43. DRE today Prostate 40 grams and nodule at the left apex. Pt would like PSA redrawn. Will redraw. Recommended TRUS Bx. Pt agrees and would like to proceed,    Significant CAD history - CABG 2005, multiple coronary stents most recently 05/2014, managed by Dr Jerline Pain    TIA 2013- currently on Plavix and ASA    DM    HTN    Plan:  PSA reviewed with pt. PSA redrawn today.  Discuss with Dr. Jerline Pain, stopping the Plavix and ASA prior to biopsy  TRUS Bx packet and instructions provided to pt  Script for Levaquin 750mg  x1 provided to pt.  FU for prostate biopsy          Discussion: We discussed the risks and benefits of transrectal ultrasound guided prostate biopsy including possible pain, infection/sepsis (estimated risk: 3-5%), prostatitis, bleeding, and inconclusive diagnostic information necessitating close follow up or repeat biopsy.  He expressed understanding and desire to proceed with prostate biopsy.  I provided a prescription for antibiotics which he will take 2 hours prior to scheduled biopsy, and additional IM antibiotics will be Shellie Goettl on arrival for his procedure.    Chief Complaint   Patient presents with   ??? Elevated PSA       HPI: Pt returns to the office for f/u of elevated PSA. Referral form noted PSA of 7.1.  He had a previous prostate biopsy in 2013 by Dr Maida Sale for PSA of 5.1.  Biopsy was negative, one area of inflammation, volume 42gms.  PSA on 12/26/2015 was 10.470 ng/mL. PSA today 04/15/2016 is 43. DRE today Prostate 40 grams and nodule at the left apex.  Will redraw PSA per pt's request. Recommended TRUS Bx. Pt agrees and would like to proceed.  No known FH of prostate cancer.  He is currently on  Flomax for weak stream with benefit.    No dysuria, hematuria, or changes in bowel habits. He reports no abdominal/back pain. He reports unintentional weight loss of about 10lbs as a result of poor appetite.    He reports he's been having pain in his right upper quadrant and stiffness in his neck and shoulders. Followed by Dr. Jerline Pain.    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    PSA /TESTOSTERONE - BSHSI PSA   Latest Ref Rng & Units 0.00 - 4.00 ng/mL   04/15/2016 43   12/26/2015 10.470   04/19/2015 6.09 (H)     Past Medical History:   Diagnosis Date   ??? CAD (coronary artery disease) 2005, 2015    CABG 2005, multiple stents, most recently in 05/2014   ??? Diabetes ([place])    ??? DM (diabetes mellitus screen)    ??? GERD (gastroesophageal reflux disease)    ??? HTN (hypertension)    ???  Liver disorder    ??? Thyroid disease    ??? TIA (transient ischemic attack) 2013       Past Surgical History:   Procedure Laterality Date   ??? CARDIAC SURG PROCEDURE UNLIST       cardiac bypass 2005 stent placement   ??? HX CATARACT REMOVAL Left 08/16/2015   ??? HX CHOLECYSTECTOMY  1974   ??? 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 HX SPLENECTOMY         Allergies   Allergen Reactions   ??? Naproxen Hives         Current Outpatient Prescriptions:   ???  metoprolol succinate (TOPROL-XL) 50 mg XL tablet, , Disp: , Rfl:   ???  nitroglycerin (NITROSTAT) 0.4 mg SL tablet, , Disp: , Rfl:   ???  levoFLOXacin (LEVAQUIN) 750 mg tablet, Take 1 hour prior to biopsy, Disp: 1 Tab, Rfl: 0  ???  isosorbide dinitrate (ISORDIL) 30 mg tablet, Take 30 mg by mouth four (4) times daily., Disp: , Rfl:    ???  gabapentin (NEURONTIN) 300 mg capsule, , Disp: , Rfl:   ???  glimepiride (AMARYL) 1 mg tablet, , Disp: , Rfl:   ???  simvastatin (ZOCOR) 40 mg tablet, , Disp: , Rfl:   ???  tamsulosin (FLOMAX) 0.4 mg capsule, , Disp: , Rfl:   ???  clopidogrel (PLAVIX) 75 mg tablet, , Disp: , Rfl:   ???  METFORMIN HCL (METFORMIN PO), Take  by mouth., Disp: , Rfl:   ???  LEVOTHYROXINE SODIUM (LEVOTHYROXINE PO), Take  by mouth., Disp: , Rfl:   ???  losartan (COZAAR) 50 mg tablet, Take  by mouth daily., Disp: , Rfl:   ???  aspirin 81 mg tablet, Take 81 mg by mouth., Disp: , Rfl:     History reviewed. No pertinent family history.  Social History     Social History   ??? Marital status: SINGLE     Spouse name: N/A   ??? Number of children: N/A   ??? Years of education: N/A     Social History Main Topics   ??? Smoking status: Former Smoker   ??? Smokeless tobacco: Never Used   ??? Alcohol use No   ??? Drug use: No   ??? Sexual activity: Not Asked     Other Topics Concern   ??? None     Social History Narrative         ROS:  Review of Systems  Constitutional: Fever: No  Skin: Rash: No  HEENT: Hearing difficulty: No  Eyes: Blurred vision: No  Cardiovascular: Chest pain: No  Respiratory: Shortness of breath: No  Gastrointestinal: Nausea/vomiting: No  Musculoskeletal: Back pain: No  Neurological: Weakness: No  Psychological: Memory loss: No  Comments/additional findings:     PE:  Visit Vitals   ??? BP 118/72   ??? Ht 5\' 9"  (1.753 m)   ??? Wt 165 lb (74.8 kg)   ??? BMI 24.37 kg/m2     Gen: WNWD, NAD  Resp: Symmetrical effort, non-labored  CV: No peripheral edema  GU: DRE exam: Prostate 40 grams and nodule at the left apex  Skin: no jaundice  Ext: warm and perfused  Neuro: A&Ox3    Results for orders placed or performed in visit on 04/15/16   PROSTATE SPECIFIC ANTIGEN, TOTAL (PSA)   Result Value Ref Range    Prostate Specific Ag 43.03 (H) 0.00 - 4.00 ng/mL   AMB POC  URINALYSIS DIP STICK AUTO W/O MICRO   Result Value Ref Range    Color (UA POC)      Clarity (UA POC)       Glucose (UA POC) 1+ Negative    Bilirubin (UA POC) 1+ Negative    Ketones (UA POC) Trace Negative    Specific gravity (UA POC) 1.020 1.001 - 1.035    Blood (UA POC) Negative Negative    pH (UA POC) 5.5 4.6 - 8.0    Protein (UA POC) 1+ Negative mg/dL    Urobilinogen (UA POC) 1 mg/dL 0.2 - 1    Nitrites (UA POC) Negative Negative    Leukocyte esterase (UA POC) Negative Negative           Herbie Hawthorne Keyler Hoge MD   Urologic Oncologist  [facility] MD Professorship  Professor of Urology  [place] [phone] Ext [phone]    [id number] Candyce Churn, MD    Medical documentation provided with the assistance of Georges Mouse, medical scribe to Okey Regal Francesa Eugenio, MD on 04/15/2016.

## 2016-04-15 NOTE — Progress Notes (Deleted)
John Higgins  [birth year]  04/15/2016    Impression:    ICD-10-CM ICD-9-CM    1. Elevated PSA R97.20 790.93 PR COLLECTION VENOUS BLOOD,VENIPUNCTURE      AMB POC URINALYSIS DIP STICK AUTO W/O MICRO   2. Increased prostate specific antigen (PSA) velocity R97.20 790.93 AMB POC URINALYSIS DIP STICK AUTO W/O MICRO       Elevated PSA s/p negative biopsy by Dr Maida Sale in 2013 when PSA 5.1. Prostate vol 42.3 gms. PSA now 7.1 per referral note, do not have actual records. PSA 12/26/2015 was 10.470 ng/mL. PSA today 04/15/2016 is 43. DRE today Prostate 40 grams and nodule at the left apex. Pt would like PSA redrawn. Will redraw. Recommended TRUS Bx. Pt agrees and would like to proceed,    Significant CAD history - CABG 2005, multiple coronary stents most recently 05/2014, managed by Dr Jerline Pain    TIA 2013- currently on Plavix and ASA    DM    HTN    Plan:  PSA reviewed with pt. PSA redrawn today.  Discuss with Dr. Jerline Pain, stopping the Plavix and ASA prior to biopsy  TRUS Bx packet and instructions provided to pt  Script for Levaquin 750mg  x1 provided to pt.  FU for prostate biopsy          Discussion: We discussed the risks and benefits of transrectal ultrasound guided prostate biopsy including possible pain, infection/sepsis (estimated risk: 3-5%), prostatitis, bleeding, and inconclusive diagnostic information necessitating close follow up or repeat biopsy.  He expressed understanding and desire to proceed with prostate biopsy.  I provided a prescription for antibiotics which he will take 2 hours prior to scheduled biopsy, and additional IM antibiotics will be given on arrival for his procedure.    Chief Complaint   Patient presents with   ??? Elevated PSA       HPI: Pt returns to the office for f/u of elevated PSA. Referral form noted PSA of 7.1.  He had a previous prostate biopsy in 2013 by Dr Maida Sale for PSA of 5.1.  Biopsy was negative, one area of inflammation, volume 42gms.  PSA on 12/26/2015 was 10.470 ng/mL. PSA today 04/15/2016 is 43. DRE today Prostate 40 grams and nodule at the left apex.  Will redraw PSA per pt's request. Recommended TRUS Bx. Pt agrees and would like to proceed.  No known FH of prostate cancer.  He is currently on  Flomax for weak stream with benefit.    No dysuria, hematuria, or changes in bowel habits. He reports no abdominal/back pain. He reports unintentional weight loss of about 10lbs as a result of poor appetite.    He reports he's been having pain in his right upper quadrant and stiffness in his neck and shoulders. Followed by Dr. Jerline Pain.    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    PSA /TESTOSTERONE - BSHSI PSA   Latest Ref Rng & Units 0.00 - 4.00 ng/mL   04/15/2016 43   12/26/2015 10.470   04/19/2015 6.09 (H)     Past Medical History:   Diagnosis Date   ??? CAD (coronary artery disease) 2005, 2015    CABG 2005, multiple stents, most recently in 05/2014   ??? Diabetes ([place])    ??? DM (diabetes mellitus screen)    ??? GERD (gastroesophageal reflux disease)    ??? HTN (hypertension)    ???  Liver disorder    ??? Thyroid disease    ??? TIA (transient ischemic attack) 2013       Past Surgical History:   Procedure Laterality Date   ??? CARDIAC SURG PROCEDURE UNLIST       cardiac bypass 2005 stent placement   ??? HX CATARACT REMOVAL Left 08/16/2015   ??? HX CHOLECYSTECTOMY  1974   ??? 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 HX SPLENECTOMY         Allergies   Allergen Reactions   ??? Naproxen Hives         Current Outpatient Prescriptions:   ???  metoprolol succinate (TOPROL-XL) 50 mg XL tablet, , Disp: , Rfl:   ???  nitroglycerin (NITROSTAT) 0.4 mg SL tablet, , Disp: , Rfl:   ???  levoFLOXacin (LEVAQUIN) 750 mg tablet, Take 1 hour prior to biopsy, Disp: 1 Tab, Rfl: 0  ???  isosorbide dinitrate (ISORDIL) 30 mg tablet, Take 30 mg by mouth four (4) times daily., Disp: , Rfl:    ???  gabapentin (NEURONTIN) 300 mg capsule, , Disp: , Rfl:   ???  glimepiride (AMARYL) 1 mg tablet, , Disp: , Rfl:   ???  simvastatin (ZOCOR) 40 mg tablet, , Disp: , Rfl:   ???  tamsulosin (FLOMAX) 0.4 mg capsule, , Disp: , Rfl:   ???  clopidogrel (PLAVIX) 75 mg tablet, , Disp: , Rfl:   ???  METFORMIN HCL (METFORMIN PO), Take  by mouth., Disp: , Rfl:   ???  LEVOTHYROXINE SODIUM (LEVOTHYROXINE PO), Take  by mouth., Disp: , Rfl:   ???  losartan (COZAAR) 50 mg tablet, Take  by mouth daily., Disp: , Rfl:   ???  aspirin 81 mg tablet, Take 81 mg by mouth., Disp: , Rfl:     History reviewed. No pertinent family history.  Social History     Social History   ??? Marital status: SINGLE     Spouse name: N/A   ??? Number of children: N/A   ??? Years of education: N/A     Social History Main Topics   ??? Smoking status: Former Smoker   ??? Smokeless tobacco: Never Used   ??? Alcohol use No   ??? Drug use: No   ??? Sexual activity: Not Asked     Other Topics Concern   ??? None     Social History Narrative         ROS:  Review of Systems  Constitutional: Fever: No  Skin: Rash: No  HEENT: Hearing difficulty: No  Eyes: Blurred vision: No  Cardiovascular: Chest pain: No  Respiratory: Shortness of breath: No  Gastrointestinal: Nausea/vomiting: No  Musculoskeletal: Back pain: No  Neurological: Weakness: No  Psychological: Memory loss: No  Comments/additional findings:     PE:  Visit Vitals   ??? BP 118/72   ??? Ht 5\' 9"  (1.753 m)   ??? Wt 165 lb (74.8 kg)   ??? BMI 24.37 kg/m2     Gen: WNWD, NAD  Resp: Symmetrical effort, non-labored  CV: No peripheral edema  GU: DRE exam: Prostate 40 grams and nodule at the left apex  Skin: no jaundice  Ext: warm and perfused  Neuro: A&Ox3    Results for orders placed or performed in visit on 04/15/16   PROSTATE SPECIFIC ANTIGEN, TOTAL (PSA)   Result Value Ref Range    Prostate Specific Ag 43.03 (H) 0.00 - 4.00 ng/mL   AMB POC  URINALYSIS DIP STICK AUTO W/O MICRO   Result Value Ref Range    Color (UA POC)      Clarity (UA POC)       Glucose (UA POC) 1+ Negative    Bilirubin (UA POC) 1+ Negative    Ketones (UA POC) Trace Negative    Specific gravity (UA POC) 1.020 1.001 - 1.035    Blood (UA POC) Negative Negative    pH (UA POC) 5.5 4.6 - 8.0    Protein (UA POC) 1+ Negative mg/dL    Urobilinogen (UA POC) 1 mg/dL 0.2 - 1    Nitrites (UA POC) Negative Negative    Leukocyte esterase (UA POC) Negative Negative           Herbie Allen Given MD   Urologic Oncologist  [facility] MD Professorship  Professor of Urology  [place] [phone] Ext [phone]    [id number] Candyce Churn, MD    Medical documentation provided with the assistance of Georges Mouse, medical scribe to Okey Regal Given, MD on 04/15/2016.

## 2016-04-18 ENCOUNTER — Encounter: Attending: Urology | Primary: Family Medicine

## 2016-04-19 NOTE — Progress Notes (Signed)
Letter sent

## 2016-04-19 NOTE — Telephone Encounter (Signed)
Received call from PCP office stating they did a CT that does not look good. She is faxing me a copy. Verified biopsy date and was asked if we can move it up sooner. I told her to please fax report and I would have Dr Given review it to see if we can move it up on another providers schedule.

## 2016-04-23 ENCOUNTER — Encounter

## 2016-04-23 NOTE — Progress Notes (Signed)
Faxed imaging to [facility]

## 2016-04-23 NOTE — Progress Notes (Signed)
Bone scan order given to [facility] to schedule at [facility]

## 2016-04-23 NOTE — Telephone Encounter (Signed)
Opened in error

## 2016-04-23 NOTE — Telephone Encounter (Signed)
Pt informed of CT and PSA results.  Bone scan ordered.  Bx scheduled for 7/11

## 2016-04-25 NOTE — Progress Notes (Signed)
Per Dr Jerline Pain:    OK to stop asa and plavix (recommend 5 days)     Jolaine Artist

## 2016-04-25 NOTE — Progress Notes (Signed)
Patient informed

## 2016-05-03 NOTE — Progress Notes (Signed)
No auth or deposit required for Degarelix.

## 2016-05-03 NOTE — Progress Notes (Signed)
Per orders of Dr. Gearldine Bienenstock.  I have referred John Higgins to the Benefits Dept to begin benefit verification for Degarelix.       05/03/2016   Dosage:  240/80  Frequency: monthly   Appt Date: 05/14/16  Dx Code:  C79.51  Secondary Dx Code:  (required for John Higgins, John Higgins)  Patient's ECOG Performance Status:  0  Cancer Staging:  Pending trus bx

## 2016-05-08 NOTE — Telephone Encounter (Signed)
Pt informed of bone scan showing mets.  Pt scheduled for prostate bx on 7/11.   Discussed need for ADT.   Could be candidate for Arches or Pronounce trial .  Also candidate for addition of Zytiga or chemo

## 2016-05-14 ENCOUNTER — Encounter

## 2016-05-14 ENCOUNTER — Ambulatory Visit: Admit: 2016-05-14 | Discharge: 2016-05-14 | Payer: MEDICARE | Attending: Urology | Primary: Family Medicine

## 2016-05-14 DIAGNOSIS — N402 Nodular prostate without lower urinary tract symptoms: Secondary | ICD-10-CM

## 2016-05-14 MED ORDER — DEGARELIX 120 MG SUB-Q SOLN
120 mg | Freq: Once | SUBCUTANEOUS | 0 refills | Status: AC
Start: 2016-05-14 — End: 2016-05-14

## 2016-05-14 MED ORDER — CEFTRIAXONE 1 GRAM SOLUTION FOR INJECTION
1 gram | Freq: Once | INTRAMUSCULAR | 0 refills | Status: AC
Start: 2016-05-14 — End: 2016-05-14

## 2016-05-14 NOTE — Progress Notes (Signed)
Orders Placed This Encounter   ??? CEFTRIAXONE SODIUM INJECTION PER 250 MG     Mix per protocol     Order Specific Question:   Dose     Answer:   250mg  x4=1000     Order Specific Question:   Site     Answer:   RIGHT GLUTEUS     Order Specific Question:   Expiration Date     Answer:   06/14/2018     Order Specific Question:   Lot#     Answer:   [id number]     Order Specific Question:   Manufacturer     Answer:   [employer]     Order Specific Question:   Charge Quantity?     Answer:   4     Order Specific Question:   Perfomed by/Witnessed by:     Answer:   Jackquline Bosch   ??? triamcinolone acetonide (KENALOG) 0.1 % ointment     Sig: apply to affected area three times a day for 5 days     Refill:  0   ??? cefTRIAXone (ROCEPHIN) 1 gram injection     Sig: 1 g by IntraMUSCular route once for 1 dose.     Dispense:  1 Vial     Refill:  0

## 2016-05-14 NOTE — Progress Notes (Signed)
Orders Placed This Encounter   ??? TESTOSTERONE   ??? VITAMIN D, 25 HYDROXY   ??? CALCIUM   ??? PR ECHO,TRANSRECTAL   ??? PR SONO GUIDE NEEDLE BIOPSY   ??? CEFTRIAXONE SODIUM INJECTION PER 250 MG     Mix per protocol     Order Specific Question:   Dose     Answer:   250mg  x4=1000     Order Specific Question:   Site     Answer:   RIGHT GLUTEUS     Order Specific Question:   Expiration Date     Answer:   06/14/2018     Order Specific Question:   Lot#     Answer:   [id number]     Order Specific Question:   Manufacturer     Answer:   [employer]     Order Specific Question:   Charge Quantity?     Answer:   4     Order Specific Question:   Perfomed by/Witnessed by:     Answer:   Jackquline Bosch   ??? BIOPSY OF PROSTATE,NEEDLE/PUNCH   ??? PR COLLECTION VENOUS BLOOD,VENIPUNCTURE   ??? CHEMOTHER HORMON ANTINEOPL SUB-Q/IM (QUANTITY - 2)     Order Specific Question:   Charge Quantity?     Answer:   2   ??? PR DEGARELIX INJECTION     Order Specific Question:   Charge Quantity?     Answer:   32     Order Specific Question:   Dose     Answer:   240MG      Order Specific Question:   Site     Answer:   OTHER     Comments:   120MG  INTO EACH LEFT AND RIGHT ABDOMEN      Order Specific Question:   Expiration Date     Answer:   04/14/2018     Order Specific Question:   Lot#     Answer:   [id number]     Order Specific Question:   Manufacturer     Answer:   [employer]     Order Specific Question:   Perfomed by/Witnessed by:     Answer:   Madaline Guthrie     Order Specific Question:   NDC#     Answer:   [id number]   ??? triamcinolone acetonide (KENALOG) 0.1 % ointment     Sig: apply to affected area three times a day for 5 days     Refill:  0   ??? cefTRIAXone (ROCEPHIN) 1 gram injection     Sig: 1 g by IntraMUSCular route once for 1 dose.     Dispense:  1 Vial     Refill:  0   ??? degarelix (FIRMAGON) 120 mg solr injection     Sig: 240 mg by SubCUTAneous route once for 1 dose.     Dispense:  240 mg     Refill:  0

## 2016-05-14 NOTE — Progress Notes (Signed)
TRANSRECTAL ULTRASOUND AND PROSTATE NEEDLE BIOPSY NOTE    Current Outpatient Prescriptions on File Prior to Visit   Medication Sig Dispense Refill   ??? metoprolol succinate (TOPROL-XL) 50 mg XL tablet      ??? nitroglycerin (NITROSTAT) 0.4 mg SL tablet      ??? levoFLOXacin (LEVAQUIN) 750 mg tablet Take 1 hour prior to biopsy 1 Tab 0   ??? isosorbide dinitrate (ISORDIL) 30 mg tablet Take 30 mg by mouth four (4) times daily.     ??? gabapentin (NEURONTIN) 300 mg capsule      ??? glimepiride (AMARYL) 1 mg tablet      ??? simvastatin (ZOCOR) 40 mg tablet      ??? tamsulosin (FLOMAX) 0.4 mg capsule      ??? METFORMIN HCL (METFORMIN PO) Take  by mouth.     ??? LEVOTHYROXINE SODIUM (LEVOTHYROXINE PO) Take  by mouth.     ??? losartan (COZAAR) 50 mg tablet Take  by mouth daily.     ??? clopidogrel (PLAVIX) 75 mg tablet      ??? aspirin 81 mg tablet Take 81 mg by mouth.       No current facility-administered medications on file prior to visit.        Allergies   Allergen Reactions   ??? Naproxen Hives       PSA:   43.03 as of 04/15/2016    Prebiopsy diagnosis:  Elevated PSA    Postbiopsy diagnosis:  Same    Procedure performed:  Transrectal [place] and Echo guided Prostate Needle Biopsy    Surgeon: Okey Regal Given, MD    Palpable Nodule: Yes nodule at the left apex    Procedure:  Patient has confirmed that the oral antibiotic Levaquin was taken prior to the procedure and had a limited bowel prep. IM antibiotic was given by staff.  The patient verifies that he is not taking blood thinners or aspirin.  Urinalysis noted to be negative for infection.      He was placed in the left lateral decubitus position with knees drawn up.  An endorectal probe was placed in the rectum. Anesthesia: Prostate Block - 2% Xylocaine was injected in an appropriate fashion.    Transverse and sagittal images of the prostate and seminal vesicles were obtained and measurements were recorded.  Using the needle guide on the probe, a biopsy  needle was used to obtain biopsies of the prostate in a standard fashion.  If seen, any abnormal lesions were targeted along with systematic biopsies from each side.  Tissue cores were placed in formalin jars and sent for pathologic review.    The probe was removed and the patient was observed.  Appropriate activity levels and timing of resuming sexual activity was reviewed.  Patient was warned of potential complications and with instructions on how to contact our office. This includes but not limited to elevated temperature, excessive bleeding per urethra or rectum as well as others.  All questions were answered to his satisfaction.       Findings:    DRE-  Approximately 40 gm size gland.  with prostate nodule at left apex  TRUS Volume:  48.10 cm3   PSA-Density: 0.89  Prostate capsule: nl  Hypoechoic Mass:  NO  Seminal vesicles:  Normal and symmetrical      Biopsy Pattern:         Base  Mid   Apex  TZ  Left  1  1  1   0  Right    1  1  1   0  Lateral left 0  0  0  0  Lateral right 0  0  0  0         Plan:   FU 2 wks to review pathology      Herbie [place] Given MD   Urologic Oncologist  [facility] MD Professorship  Associate Professor of Urology  [facility] [phone] Ext [phone]    [id number] Candyce Churn, MD    Medical documentation provided with the assistance of Georges Mouse, medical scribe to [employer] on 05/14/2016.

## 2016-05-14 NOTE — Progress Notes (Addendum)
John Higgins  1941/05/12  04/15/2016    Impression:  Encounter Diagnoses     ICD-10-CM ICD-9-CM   1. Prostate nodule N40.2 600.10   2. Prostate cancer ([place]) C61 185   3. 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   4. Elevated PSA R97.20 790.93   5. Elevated prostate specific antigen (PSA) R97.20 790.93     cT2bN0M1b Prostate Cancer w/ bone mets: TRUS bx today to further assess staging. Initiated on Degerelix 240mg  today. Will start Xgeva. SEs reviewed. PT understands and agrees.   -Most recent bone scan and CT scan on 04/2016 showed extensive osseous metastatic disease   - Will start ARAMIS, if pt is a candidate    Significant CAD history - CABG 2005, multiple coronary stents most recently 05/2014, managed by Dr Jerline Pain    TIA 2013- currently on Plavix and ASA    DM    HTN    Plan:  TRUS Bx today  Degerelix 240 mg injection today  Consider ARAMIS trial.   Literature provided to pt to review.  Start [place], with next Degerelix injection. SEs reviewed.  Pt Alesia Oshields information on Vit D and Ca  Vit D, Ca, and T drawn today.  FU in 2 weeks with path results        Chief Complaint   Patient presents with   ??? Elevated PSA       HPI: Pt returns to the office for f/u of elevated PSA. Referral form noted PSA of 7.1.  He had a previous prostate biopsy in 2013 by Dr Maida Sale for PSA of 5.1.  Biopsy was negative, one area of inflammation, volume 42gms. PSA on 12/26/2015 was 10.470 ng/mL. PSA  04/15/2016 is 43. DRE today Prostate 40 grams and nodule at the left apex.  Will redraw PSA per pt's request. Recommended TRUS Bx. Pt agrees and would like to proceed.  No known FH of prostate cancer.  He is currently on  Flomax for weak stream with benefit.    No dysuria, hematuria, or changes in bowel habits. He reports no abdominal/back pain. He reports unintentional weight loss of about 10lbs as a result of poor appetite.    He reports he's been having pain in his right upper quadrant and stiffness  in his neck and shoulders. Followed by Dr. Jerline Pain.    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    PSA /TESTOSTERONE - BSHSI PSA   Latest Ref Rng & Units 0.00 - 4.00 ng/mL   04/15/2016 43   12/26/2015 10.470   04/19/2015 6.09 (H)     Past Medical History:   Diagnosis Date   ??? CAD (coronary artery disease) 2005, 2015    CABG 2005, multiple stents, most recently in 05/2014   ??? Diabetes ([place])    ??? DM (diabetes mellitus screen)    ??? GERD (gastroesophageal reflux disease)    ??? HTN (hypertension)    ??? Liver disorder    ??? Thyroid disease    ??? TIA (transient ischemic attack) 2013       Past Surgical History:   Procedure Laterality Date   ??? CARDIAC SURG PROCEDURE UNLIST       cardiac bypass 2005 stent placement   ??? HX CATARACT REMOVAL Left 08/16/2015   ??? HX CHOLECYSTECTOMY  1974   ??? HX GI      tumor removed from pancreas in  2015   ??? HX SPLENECTOMY         Allergies   Allergen Reactions   ??? Naproxen Hives         Current Outpatient Prescriptions:   ???  metoprolol succinate (TOPROL-XL) 50 mg XL tablet, , Disp: , Rfl:   ???  nitroglycerin (NITROSTAT) 0.4 mg SL tablet, , Disp: , Rfl:   ???  levoFLOXacin (LEVAQUIN) 750 mg tablet, Take 1 hour prior to biopsy, Disp: 1 Tab, Rfl: 0  ???  isosorbide dinitrate (ISORDIL) 30 mg tablet, Take 30 mg by mouth four (4) times daily., Disp: , Rfl:   ???  gabapentin (NEURONTIN) 300 mg capsule, , Disp: , Rfl:   ???  glimepiride (AMARYL) 1 mg tablet, , Disp: , Rfl:   ???  simvastatin (ZOCOR) 40 mg tablet, , Disp: , Rfl:   ???  tamsulosin (FLOMAX) 0.4 mg capsule, , Disp: , Rfl:   ???  clopidogrel (PLAVIX) 75 mg tablet, , Disp: , Rfl:   ???  METFORMIN HCL (METFORMIN PO), Take  by mouth., Disp: , Rfl:   ???  LEVOTHYROXINE SODIUM (LEVOTHYROXINE PO), Take  by mouth., Disp: , Rfl:   ???  losartan (COZAAR) 50 mg tablet, Take  by mouth daily., Disp: , Rfl:   ???  aspirin 81 mg tablet, Take 81 mg by mouth., Disp: , Rfl:      History reviewed. No pertinent family history.  Social History     Social History   ??? Marital status: SINGLE     Spouse name: N/A   ??? Number of children: N/A   ??? Years of education: N/A     Social History Main Topics   ??? Smoking status: Former Smoker   ??? Smokeless tobacco: Never Used   ??? Alcohol use No   ??? Drug use: No   ??? Sexual activity: Not Asked     Other Topics Concern   ??? None     Social History Narrative         ROS:  Review of Systems  Constitutional: Fever: No  Skin: Rash: No  HEENT: Hearing difficulty: No  Eyes: Blurred vision: No  Cardiovascular: Chest pain: No  Respiratory: Shortness of breath: No  Gastrointestinal: Nausea/vomiting: No  Musculoskeletal: Back pain: No  Neurological: Weakness: No  Psychological: Memory loss: No  Comments/additional findings:     PE:  Visit Vitals   ??? BP 118/72   ??? Ht 5\' 9"  (1.753 m)   ??? Wt 165 lb (74.8 kg)   ??? BMI 24.37 kg/m2     Gen: WNWD, NAD  Resp: Symmetrical effort, non-labored  CV: No peripheral edema  GU: DRE exam: Prostate 40 grams and nodule at the left apex  Skin: no jaundice  Ext: warm and perfused  Neuro: A&Ox3    Results for orders placed or performed in visit on 04/15/16   PROSTATE SPECIFIC ANTIGEN, TOTAL (PSA)   Result Value Ref Range    Prostate Specific Ag 43.03 (H) 0.00 - 4.00 ng/mL   AMB POC URINALYSIS DIP STICK AUTO W/O MICRO   Result Value Ref Range    Color (UA POC)      Clarity (UA POC)      Glucose (UA POC) 1+ Negative    Bilirubin (UA POC) 1+ Negative    Ketones (UA POC) Trace Negative    Specific gravity (UA POC) 1.020 1.001 - 1.035    Blood (UA POC) Negative Negative    pH (UA POC) 5.5 4.6 -  8.0    Protein (UA POC) 1+ Negative mg/dL    Urobilinogen (UA POC) 1 mg/dL 0.2 - 1    Nitrites (UA POC) Negative Negative    Leukocyte esterase (UA POC) Negative Negative       CT A/P WO cont 04/19/2016;  Impression:      1. Findings concerning for new osseous metastasis.  Bone scan recommended for further assessment.      2.  Interval postsurgical changes of the distal pancreas and spleen.     3.  No obvious metastasis to the liver, though evaluation is limited on a noncontrast study.  No significant abdominal or pelvic adenopathy.     4.  Small fat containing a focal hernia and patulous left inguinal ring.     5.  Enlarged prostate.    -Bone Scan 05/01/2016  Impression:  Extensive osseous metastatic disease with innumerable lesions of the axial and proximal appendicular skeleton.      Herbie [place] Eason Housman MD   Urologic Oncologist  [facility] Hermelinda Medicus MD Professorship  Professor of Urology  [facility]  Office [phone] Ext [phone]    [id number] Candyce Churn, MD    Medical documentation provided with the assistance of Georges Mouse, medical scribe to Okey Regal Adib Wahba, MD on 04/15/2016.

## 2016-05-15 LAB — TESTOSTERONE: Testosterone: 405 ng/dL (ref 348–1197)

## 2016-05-15 LAB — VITAMIN D, 25 HYDROXY: VITAMIN D, 25-HYDROXY: 24.6 ng/ml — ABNORMAL LOW (ref 30.0–100.0)

## 2016-05-15 LAB — CALCIUM: Calcium: 9.4 mg/dL (ref 8.6–10.2)

## 2016-05-15 NOTE — Progress Notes (Signed)
BV in process 05/16/16

## 2016-05-15 NOTE — Progress Notes (Signed)
Per orders of Dr. Gearldine Bienenstock.  I have referred John Higgins to the Benefits Dept to begin benefit verification for John Higgins  (name of medication).       05/15/2016   Dosage:  120 mg  Frequency: Qmonth   Appt Date: 06-11-16  Dx Code:  John Higgins  Secondary Dx Code: C79.51 (required for John Higgins, John Higgins & John Higgins)  Patient's ECOG Performance Status:  2  Cancer Staging:  4

## 2016-05-15 NOTE — Progress Notes (Signed)
Left msg for patient that I set up his injection appt for 8/8. Also informed him of the biopsy results appt on 7/25

## 2016-05-15 NOTE — Progress Notes (Signed)
No auth or deposit required for Xgeva

## 2016-05-17 NOTE — Progress Notes (Signed)
Letter sent

## 2016-05-21 ENCOUNTER — Encounter: Attending: Urology | Primary: Family Medicine

## 2016-05-21 LAB — PROSTATE BIOPSY

## 2016-05-28 ENCOUNTER — Ambulatory Visit: Admit: 2016-05-28 | Discharge: 2016-05-28 | Payer: MEDICARE | Attending: Urology | Primary: Family Medicine

## 2016-05-28 DIAGNOSIS — C61 Malignant neoplasm of prostate: Secondary | ICD-10-CM

## 2016-05-28 NOTE — Progress Notes (Signed)
John Higgins  July 09, 1941  04/15/2016    Impression:  Encounter Diagnoses     ICD-10-CM ICD-9-CM   1. 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: Initiated on Degerelix '80mg'$  05/2016. Starting [name].   -Most recent bone scan and CT scan on 04/2016 showed extensive osseous metastatic disease   -Pt contemplating starting ARAMIS, Kandis Henry information    Significant CAD history - CABG 2005, multiple coronary stents most recently 05/2014, managed by Dr Jerline Pain    TIA 2013- currently on Plavix and ASA    DM    HTN    Plan:  Continue Degerelix  Starting Xgeva. SE reviewed  Pt considering ARAMIS trial, information Theodis Kinsel. Also discussed options of Zytiga or chemotx  instead.  F/u 3 months with PSA, T, Vit D, Ca    Chief Complaint   Patient presents with   ??? Elevated PSA       HPI: Pt returns to the office for prostate bx f/u. Referral form noted PSA of 7.1.  He had a previous prostate biopsy in 2013 by Dr Maida Sale for PSA of 5.1.  Biopsy was negative, one area of inflammation, volume 42gms. PSA on 12/26/2015 was 10.470 ng/mL. PSA  04/15/2016 is 43. DRE performed at [facility]osy, prostate 40 grams and nodule at the left apex. No known FH of prostate cancer. Pt reports continued right thigh pain, present for many weeks, improving. Pt reports good stream, on flomax with benefit. Denies dysuria, hematuria, or changes in bowel habits. He reports no abdominal/back pain.    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    PSA /TESTOSTERONE - BSHSI PSA   Latest Ref Rng & Units 0.00 - 4.00 ng/mL   04/15/2016 43.03 (H)   04/19/2015 6.09 (H)     Past Medical History:   Diagnosis Date   ??? CAD (coronary artery disease) 2005, 2015    CABG 2005, multiple stents, most recently in 05/2014   ??? Diabetes ([place])    ??? DM (diabetes mellitus screen)     ??? GERD (gastroesophageal reflux disease)    ??? HTN (hypertension)    ??? Liver disorder    ??? Thyroid disease    ??? TIA (transient ischemic attack) 2013       Past Surgical History:   Procedure Laterality Date   ??? CARDIAC SURG PROCEDURE UNLIST       cardiac bypass 2005 stent placement   ??? HX CATARACT REMOVAL Left 08/16/2015   ??? HX CHOLECYSTECTOMY  1974   ??? 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 HX SPLENECTOMY         Allergies   Allergen Reactions   ??? Naproxen Hives         Current Outpatient Prescriptions:   ???  metoprolol succinate (TOPROL-XL) 50 mg XL tablet, , Disp: , Rfl:   ???  nitroglycerin (NITROSTAT) 0.4 mg SL tablet, , Disp: , Rfl:   ???  levoFLOXacin (LEVAQUIN) 750 mg tablet, Take 1 hour prior to biopsy, Disp: 1 Tab, Rfl: 0  ???  isosorbide dinitrate (ISORDIL) 30 mg tablet, Take 30 mg by mouth four (4) times daily., Disp: , Rfl:   ???  gabapentin (  NEURONTIN) 300 mg capsule, , Disp: , Rfl:   ???  glimepiride (AMARYL) 1 mg tablet, , Disp: , Rfl:   ???  simvastatin (ZOCOR) 40 mg tablet, , Disp: , Rfl:   ???  tamsulosin (FLOMAX) 0.4 mg capsule, , Disp: , Rfl:   ???  clopidogrel (PLAVIX) 75 mg tablet, , Disp: , Rfl:   ???  METFORMIN HCL (METFORMIN PO), Take  by mouth., Disp: , Rfl:   ???  LEVOTHYROXINE SODIUM (LEVOTHYROXINE PO), Take  by mouth., Disp: , Rfl:   ???  losartan (COZAAR) 50 mg tablet, Take  by mouth daily., Disp: , Rfl:   ???  aspirin 81 mg tablet, Take 81 mg by mouth., Disp: , Rfl:     History reviewed. No pertinent family history.  Social History     Social History   ??? Marital status: SINGLE     Spouse name: N/A   ??? Number of children: N/A   ??? Years of education: N/A     Social History Main Topics   ??? Smoking status: Former Smoker   ??? Smokeless tobacco: Never Used   ??? Alcohol use No   ??? Drug use: No   ??? Sexual activity: Not Asked     Other Topics Concern   ??? None     Social History Narrative         ROS:  Review of Systems  Constitutional: Fever: No  Skin: Rash: No  HEENT: Hearing difficulty: No   Eyes: Blurred vision: No  Cardiovascular: Chest pain: No  Respiratory: Shortness of breath: No  Gastrointestinal: Nausea/vomiting: No  Musculoskeletal: Back pain: No  Neurological: Weakness: No  Psychological: Memory loss: No  Comments/additional findings:     PE:  Visit Vitals   ??? BP 118/72   ??? Ht 5' 9" (1.753 m)   ??? Wt 165 lb (74.8 kg)   ??? BMI 24.37 kg/m2     Gen: WNWD, NAD  Resp: Symmetrical effort, non-labored  CV: No peripheral edema  GU: Meatus patent, no lesions on penis, no palpable inguinal hernias  Skin: no jaundice  Ext: warm and perfused  Neuro: A&Ox3    Results for orders placed or performed in visit on 04/15/16   PROSTATE SPECIFIC ANTIGEN, TOTAL (PSA)   Result Value Ref Range    Prostate Specific Ag 43.03 (H) 0.00 - 4.00 ng/mL   AMB POC URINALYSIS DIP STICK AUTO W/O MICRO   Result Value Ref Range    Color (UA POC)      Clarity (UA POC)      Glucose (UA POC) 1+ Negative    Bilirubin (UA POC) 1+ Negative    Ketones (UA POC) Trace Negative    Specific gravity (UA POC) 1.020 1.001 - 1.035    Blood (UA POC) Negative Negative    pH (UA POC) 5.5 4.6 - 8.0    Protein (UA POC) 1+ Negative mg/dL    Urobilinogen (UA POC) 1 mg/dL 0.2 - 1    Nitrites (UA POC) Negative Negative    Leukocyte esterase (UA POC) Negative Negative       CT A/P WO cont 04/19/2016;  Impression:      1. Findings concerning for new osseous metastasis.  Bone scan recommended for further assessment.     2.  Interval postsurgical changes of the distal pancreas and spleen.     3.  No obvious metastasis to the liver, though evaluation is limited on a noncontrast study.  No significant abdominal or  pelvic adenopathy.     4.  Small fat containing a focal hernia and patulous left inguinal ring.     5.  Enlarged prostate.    -Bone Scan 05/01/2016  Impression:  Extensive osseous metastatic disease with innumerable lesions of the axial and proximal appendicular skeleton.    Prostate Biopsy 05/14/16:  SURGICAL PATHOLOGY REPORT   ??    CLINICAL HISTORY/INFORMATION:    Previous Biopsy Result: Negative 2013   Treatment: Observation   DRE: Prostate 40 grams and nodule at the left apex   Other Pertinent Clinical History: DNA Kit# [id number]   Last PSA Result: Rising ~ 43.03 on 04/15/2016   Number of Containers Submitted: 6        ??   DIAGNOSIS:   A. Right Apex Needle biopsy ??   ?? ?? ?? ?? ?? ?? 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 ?? ?? ?? ?? ?? ??(1 of 1 core(s) positive). Grade Group 5. Tumor involves one end.   B. Right Mid Needle biopsy ??   ?? ?? ?? ?? ?? ?? ??ADENOCARCINOMA, GLEASON SCORE 5 + 5 = 10 involving 40 % of the specimen (1 of 1 core(s) positive).   ?? ?? ?? ?? ?? ?? ??Grade Group 5. Tumor involves one end.   C. Right Base Needle biopsy ??   ?? ?? ?? ?? ?? ?? ??ADENOCARCINOMA, GLEASON SCORE 5 + 4 = 9 involving 3 % of the specimen (1 of 1 core(s) positive).   ?? ?? ?? ?? ?? ?? ??Grade Group 5. Ends not involved by the tumor.   D. Left Apex Needle biopsy ??   ?? ?? ?????? ?? ?? ??Benign prostatic tissue.   E. Left Mid Needle biopsy ??   ?? ?? ?? ?? ?? ?? ??Benign prostatic tissue.   F. Left Base Needle biopsy ??   ?? ?? ?? ?? ?? ?? ??Benign prostatic tissue.     Rexene Alberts, MD    I was present during visit.  I performed the pertinent parts of the exam and they are as documented above.  We  discussed the assessment and plan,  and I concur with plan.    Herbie [place] Tauna Macfarlane MD   Urologic Oncologist  [facility] Hermelinda Medicus MD Professorship  Professor of Urology  [employer]  Office [phone] Ext [phone]

## 2016-05-29 NOTE — Progress Notes (Addendum)
Enrolled in ARCHES trial today; closing request.

## 2016-05-29 NOTE — Progress Notes (Addendum)
06/03/2016- Auth required and in process. Pt will be responsible for 25% of the cost after he's met his $400 deductible ($266.19 met). Approx copay of $2,350.65. Co-pay asst prgs are now fully allocated. In-office dispensing IS permitted. Waiting for auth approval.

## 2016-05-29 NOTE — Progress Notes (Addendum)
06/04/16- Called patient and informed him of his benefits and co-pay amt. Pt stated he could not afford the cost of the medication and inquired about asst programs. I  Informed currently the non-profit foundations are closed. However, the manufacturer for the medication offers asst and explained the eligibility criteria. Pt interested and would like for me to mail him the application and he'll bring back with him to his appt on the 8th.

## 2016-05-29 NOTE — Progress Notes (Signed)
Rcv'd applic and faxed to [employer] for processing.

## 2016-05-29 NOTE — Progress Notes (Signed)
06/04/2016- Auth approved and on file. Mailed asst applic to patient today.

## 2016-05-29 NOTE — Progress Notes (Signed)
Per orders of Dr. Gearldine Bienenstock.  I have referred John Higgins to the Benefits Dept to begin benefit verification for Zytiga   (name of medication).       05/29/2016   Dosage:    Frequency: daily   Appt Date: ASAP  Dx Code:  [id number]  Secondary Dx Code: C79.51  (required for [name], Xofigo & Provenge)  Patient's ECOG Performance Status:  0  Cancer Staging:  [name]

## 2016-05-29 NOTE — Progress Notes (Signed)
05/29/2016- BV in process

## 2016-06-01 DIAGNOSIS — C61 Malignant neoplasm of prostate: Secondary | ICD-10-CM

## 2016-06-11 ENCOUNTER — Institutional Professional Consult (permissible substitution): Admit: 2016-06-11 | Discharge: 2016-06-11 | Payer: MEDICARE | Primary: Family Medicine

## 2016-06-11 DIAGNOSIS — C7951 Secondary malignant neoplasm of bone: Secondary | ICD-10-CM

## 2016-06-11 MED ORDER — DEGARELIX 80 MG SUB-Q SOLN
80 mg | Freq: Once | SUBCUTANEOUS | 0 refills | Status: AC
Start: 2016-06-11 — End: 2016-06-11

## 2016-06-11 MED ORDER — DENOSUMAB 120 MG/1.7 ML (70 MG/ML) SUB-Q
120 mg/1.7 mL (70 mg/mL) | Freq: Once | SUBCUTANEOUS | 0 refills | Status: AC
Start: 2016-06-11 — End: 2016-06-11

## 2016-06-11 NOTE — Progress Notes (Signed)
In regards to patient receiving Xgeva injection today patient has stated to clinical staff John Higgins) that he has dentures. No previous dental clearance in patient's chart as of today. John Higgins nurse of Dr. Gearldine Bienenstock is aware of patients claim and is okay with proceeding with the injection today.

## 2016-06-11 NOTE — Progress Notes (Signed)
John Higgins is a 75 y.o. male who is here today per the order of Dr. Given to receive degarelix and to have labs drawn.   Patient's identity has been verified.   Dr. Claiborne Billings was available in the clinic as incident to provider.     Patient has not had any dental procedures since his last injection.  Patient denies any upcoming dental procedures.  Patient  denies jaw pain.  Dental Clearance letter on file is up to date.   Date of last Dental Clearance:  PT stated that he had dentures. I left [name] know that pt verbalized he had dentures. She said  Pt is ok to get xgeva    Patient has been taking supplements of at least 500 mg of calcium and 400 international units of Vitamin D daily.  This is not his first ADT injection.  This is his first DENOSUMAB injection.  He has had the necessary labs drawn.  TST, Calcium, Vitamin D and Drawn on 05/14/16    degarelix is administered to abdomen ( left) SQ without difficulty.     Patient tolerated the injection well.    Patient has been scheduled for the next injection which will be due in approximately 1 months    xgeva is administered to abdomen ( right) SQ without difficulty.     Patient tolerated the injection well.    Patient has been scheduled for the next injection which will be due in approximately 1 months  Patient has a follow-up appointment with provider on August 30, 2016      Encounter Diagnoses   Name Primary?   ??? Bone metastases ([place]) Yes   ??? Prostate cancer metastatic to multiple sites [facility])        Orders Placed This Encounter   ??? THER/PROPH/DIAG INJECTION, SUBCUT/IM   ??? CHEMOTHER HORMON ANTINEOPL SUB-Q/IM   ??? PR DEGARELIX INJECTION     Order Specific Question:   Charge Quantity?     Answer:   56     Order Specific Question:   Dose     Answer:   80mg      Order Specific Question:   Site     Answer:   LEFT LOWER QUAD ABDOMEN     Order Specific Question:   Expiration Date     Answer:   05/04/2018     Order Specific Question:   Lot#     Answer:   [id number]      Order Specific Question:   Manufacturer     Answer:   ferring     Order Specific Question:   Perfomed by/Witnessed by:     Answer:   AA     Order Specific Question:   NDC#     AnswerRL:[id number]   ??? PR DENOSUMAB INJECTION     Order Specific Question:   Charge Quantity?     Answer:   120     Order Specific Question:   Dose     Answer:   120mg      Order Specific Question:   Site     Answer:   RIGHT LOWER QUAD ABDOMEN     Order Specific Question:   Expiration Date     Answer:   05/04/2018     Order Specific Question:   Lot#     Answer:   [id number]     Order Specific Question:   Manufacturer     Answer:   Neldon Newport  Order Specific Question:   Perfomed by/Witnessed by:     Answer:   AA     Order Specific Question:   NDC#     Answer:   [id number]   ??? degarelix (FIRMAGON) 80 mg solr injection     Sig: 80 mg by SubCUTAneous route once for 1 dose.     Dispense:  1 Each     Refill:  0   ??? denosumab (XGEVA) soln injection     Sig: 1.7 mL by SubCUTAneous route once for 1 dose.     Dispense:  1.7 mL     Refill:  0       Patient did not supply own medication.    [name]     Reviewed by Quinn Axe, MD

## 2016-06-13 NOTE — Progress Notes (Signed)
The informed consent (V 1 DATED 09/14/2015) for a clinical trial for subjects with MPC was presented. (The subject had previously been given/mailed a copy of the ICF to review.) The subject was given time to read/review the ICF.  All questions were asked and answers were reviewed by the Investigator and the Coordinator with the subject to their satisfaction.  The subject has expressed the desire to participate in the clinical trial.  The subject has signed and dated the informed consent with all other parties.  A copy of the signed and dated ICF has been given to the subject for home files.  Screening procedures were initiated after the informed consent was signed and dated by all parties.      Briscoe Burns, MA, [facility]  Research Coordinator

## 2016-06-13 NOTE — Progress Notes (Signed)
The informed consent (V 2 DATED 05/02/15) for a clinical trial for subjects with CSPC was presented. (The subject had previously been given/mailed a copy of the ICF to review.) The subject was given time to read/review the ICF.  All questions were asked and answers were reviewed by the Investigator and the Coordinator with the subject to their satisfaction.  The subject has expressed the desire to participate in the clinical trial.  The subject has signed and dated the informed consent with all other parties.  A copy of the signed and dated ICF has been given to the subject for home files.  Screening procedures were initiated after the informed consent was signed and dated by all parties.      Briscoe Burns, MA, CRC

## 2016-06-14 ENCOUNTER — Encounter

## 2016-06-14 NOTE — Progress Notes (Unsigned)
PT has been scheduled for Bone Scan and CT CAP on Monday June 24, 2016 to arrive at [facility] at 12:30pm.

## 2016-06-21 ENCOUNTER — Ambulatory Visit: Admit: 2016-06-21 | Discharge: 2016-06-21 | Payer: MEDICARE | Attending: Physician Assistant | Primary: Family Medicine

## 2016-06-21 DIAGNOSIS — C61 Malignant neoplasm of prostate: Secondary | ICD-10-CM

## 2016-06-21 LAB — PSA, DIAGNOSTIC (PROSTATE SPECIFIC AG): Prostate Specific Ag: 2.82

## 2016-06-21 NOTE — Progress Notes (Signed)
SCREENING VISIT  Sponsor: Wynonia Musty  Protocol: [id number]  Subject: [id number]  Date of Visit: 21 June 2016      Informed Consent Process    Date Informed Consent Signed:  11 June 2016    Date of ICF Version Signed:  05/02/15    Version/Amendment #: 2    The following were completed during the consenting process:     - Consent was signed prior to all the study related procedures.     - Patient had plenty of time to review the informed consent content.     - Patient's questions were answered.     - Patient received a copy of signed consent form.      Briscoe Burns, MA, [place]  Research Coordinator  ----------------------------------------------------------------------------------------------------------------------  Subject came in for Screening Visit.    Inclusion/Exclusions were reviewed.  Subject [phone] continue to meet criteria.    Procedures    Vitals were performed. Blood pressure 100/68, pulse 77, temperature 98.1 ??F (36.7 ??C), resp. rate 12, height 5\' 9"  (1.753 m), weight 162 lb (73.5 kg)..      Labs were drawn without any difficulty and processed according to protocol.      Bone scans and CT scans to be done on 24 June 2016.    ECG was done.    Physical Exam was done.    ECOG was done    Subject has not consented to the optional study consent..    Patient will be scheduled once he is ready to be randomized.    Briscoe Burns, MA, CRC  Research Coordinator    FOR FULL DOCUMENTATION OF THIS CLINICAL TRIAL VISIT, PLEASE SEE THE SOURCE DOCUMENT, AS THIS IS THE FIRST PLACE VISIT INFORMATION FROM CLINICAL TRIAL VISITS IS RECORDED.

## 2016-06-21 NOTE — Progress Notes (Signed)
John Higgins  1941/05/08  04/15/2016    Impression:  Encounter Diagnoses     ICD-10-CM ICD-9-CM   1. Prostate cancer ([place]) C61 185   2. 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 Elevated PSA       HPI: Pt returns to the office for enrollment in [facility] trial.  Pt underwent prostate biopsy for PSA of 43. Biopsy positive for gleason 10 disease.  Bone scan showed extensive osseous metastatic disease with innumerable lesions of the axial and proximal appendicular skeleton. Pt initiated on Degarelix on 05/14/16. Initiated on XGeva 06/11/16.  He presents today for enrollment in [facility]CHES trial.      He had a previous prostate biopsy in 2013 by Dr RMaida Salefor PSA of 5.1.  Biopsy was negative, one area of inflammation, volume 42gms. PSA on 12/26/2015 was 10.470 ng/mL. PSA  04/15/2016 is 43. DRE performed at biopsy, prostate 40 grams and nodule at the left apex. No known FH of prostate cancer. Pt previously reported continued right thigh pain, present for many weeks, significantly improving. He is no longer using a cane.  Pt reports good stream, on flomax with benefit. Denies dysuria, hematuria, or changes in bowel habits. He reports no abdominal/back pain.    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    PSA /TESTOSTERONE - BSHSI PSA   Latest Ref Rng & Units 0.00 - 4.00 ng/mL    04/15/2016 43.03 (H)   04/19/2015 6.09 (H)     Past Medical History:   Diagnosis Date   ??? CAD (coronary artery disease) 2005, 2015    CABG 2005, multiple stents, most recently in 05/2014   ??? Diabetes ([place]    ??? DM (diabetes mellitus screen)    ??? GERD (gastroesophageal reflux disease)    ??? HTN (hypertension)    ??? Liver disorder    ??? [place]state cancer [place]     cT2bN0M1b Prostate Cancer w/ bone mets   ??? Thyroid disease          Past Surgical History:   Procedure Laterality Date   ??? CARDIAC SURG PROCEDURE UNLIST       cardiac bypass 2005 stent placement   ??? HX CATARACT REMOVAL Left 08/16/2015   ??? HX CHOLECYSTECTOMY  1974   ??? 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 HX SPLENECTOMY           Allergies   Allergen Reactions   ??? Naproxen Hives         Current Outpatient Prescriptions:   ???  triamcinolone acetonide (KENALOG) 0.1 % ointment, apply to affected area three times a day for 5 days, Disp: , Rfl: 0  ???  metoprolol succinate (TOPROL-XL) 50 mg XL tablet, , Disp: , Rfl:   ???  nitroglycerin (NITROSTAT) 0.4 mg SL tablet, , Disp: , Rfl:   ???  isosorbide dinitrate (ISORDIL) 30 mg tablet, Take 30 mg by mouth four (4) times daily., Disp: , Rfl:   ???  gabapentin (NEURONTIN) 300 mg capsule, , Disp: , Rfl:   ???  glimepiride (AMARYL) 1 mg tablet, , Disp: , Rfl:   ???  simvastatin (ZOCOR) 40 mg tablet, , Disp: , Rfl:   ???  tamsulosin (FLOMAX) 0.4 mg capsule, , Disp: , Rfl:   ???  clopidogrel (PLAVIX) 75 mg tablet, , Disp: , Rfl:   ???  METFORMIN HCL (METFORMIN PO), Take  by mouth., Disp: , Rfl:   ???  LEVOTHYROXINE SODIUM (LEVOTHYROXINE PO), Take  by mouth., Disp: , Rfl:   ???  losartan (COZAAR) 50 mg tablet, Take  by mouth daily., Disp: , Rfl:   ???  aspirin 81 mg tablet, Take 81 mg by mouth., Disp: , Rfl:     History reviewed. No pertinent family history.  Social History     Social History   ??? Marital status: SINGLE     Spouse name: N/A   ??? Number of children: N/A   ??? Years of education: N/A     Social History Main Topics    ??? Smoking status: Former Smoker   ??? Smokeless tobacco: Never Used   ??? Alcohol use No   ??? Drug use: No   ??? Sexual activity: Not Asked     Other Topics Concern   ??? None     Social History Narrative         Review of Systems  Constitutional: Fever: No  Skin: Rash: No  HEENT: Hearing difficulty: No  Eyes: Blurred vision: No  Cardiovascular: Chest pain: No  Respiratory: Shortness of breath: No  Gastrointestinal: Nausea/vomiting: No  Musculoskeletal: Back pain: No  Neurological: Weakness: No  Psychological: Memory loss: No  Comments/additional findings:     PE:  There were no vitals taken for this visit.    Gen: WNWD, NAD  Neck Supple, no cervical LA or tenderness  Resp: Symmetrical effort, non-labored, lungs CTAB  CV: No peripheral edema, RRR, no m,r,g  Abd: NABS, soft, NTND  Skin: no jaundice  Ext: warm and perfused  Neuro: A&Ox3        CT A/P WO cont 04/19/2016;  Impression:      1. Findings concerning for new osseous metastasis.  Bone scan recommended for further assessment.     2.  Interval postsurgical changes of the distal pancreas and spleen.     3.  No obvious metastasis to the liver, though evaluation is limited on a noncontrast study.  No significant abdominal or pelvic adenopathy.     4.  Small fat containing a focal hernia and patulous left inguinal ring.     5.  Enlarged prostate.    -Bone Scan 05/01/2016  Impression:  Extensive osseous metastatic disease with innumerable lesions of the axial and proximal appendicular skeleton.    Prostate Biopsy 05/14/16:  SURGICAL PATHOLOGY REPORT   ??   CLINICAL HISTORY/INFORMATION:    Previous Biopsy Result: Negative 2013   Treatment: Observation   DRE: Prostate 40 grams and nodule at the left apex   Other Pertinent Clinical History: DNA Kit# AC [id number]   Last PSA Result: Rising ~ 43.03 on 04/15/2016   Number of Containers Submitted: 6        ??  DIAGNOSIS:   A. Right Apex Needle biopsy ??   ?? ?? ?? ?? ?? ?? ??ADENOCARCINOMA with neuroendocrine differentiation, GLEASON  SCORE 5 + 4 = 9 involving 60 % of the specimen   ?? ?? ?? ?? ?? ?? ??(1 of 1 core(s) positive). Grade Group 5. Tumor involves one end.   B. Right Mid Needle biopsy ??   ?? ?? ?? ?? ?? ?? ??ADENOCARCINOMA, GLEASON SCORE 5 + 5 = 10 involving 40 % of the specimen (1 of 1 core(s) positive).   ?? ?? ?? ?? ?? ?? ??Grade Group 5. Tumor involves one end.   C. Right Base Needle biopsy ??   ?? ?? ?? ?? ?? ?? ??ADENOCARCINOMA, GLEASON SCORE 5 + 4 = 9 involving 3 % of the specimen (1 of 1 core(s) positive).   ?? ?? ?? ?? ?? ?? ??Grade Group 5. Ends not involved by the tumor.   D. Left Apex Needle biopsy ??   ?? ?? ?????? ?? ?? ??Benign prostatic tissue.   E. Left Mid Needle biopsy ??   ?? ?? ?? ?? ?? ?? ??Benign prostatic tissue.   F. Left Base Needle biopsy ??   ?? ?? ?? ?? ?? ?? ??Benign prostatic tissue.     Thresa Ross, [place]

## 2016-06-24 ENCOUNTER — Encounter

## 2016-07-03 ENCOUNTER — Ambulatory Visit: Admit: 2016-07-03 | Discharge: 2016-07-03 | Payer: MEDICARE | Attending: Urology | Primary: Family Medicine

## 2016-07-03 DIAGNOSIS — C61 Malignant neoplasm of prostate: Secondary | ICD-10-CM

## 2016-07-03 LAB — PSA, DIAGNOSTIC (PROSTATE SPECIFIC AG): Prostate Specific Ag: 2.75

## 2016-07-03 NOTE — Progress Notes (Signed)
John Higgins  Sep 02, 1941  04/15/2016    Impression:  Encounter Diagnoses     ICD-10-CM ICD-9-CM   1. 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:   -Most recent bone scan and CT scan on 04/2016 showed extensive osseous metastatic disease   - Initiated on Degerelix 25m 05/14/16   -Started XDelton See8/8/17 (pt edentulous, so no dental clearance needed)   -Enrolled in ARCHES trial 06/21/16   -PSA 2.82 as of 06/21/2016    Significant CAD history - CABG 2005, multiple coronary stents most recently 05/2014, managed by Dr PJerline Pain   Plan:  Reviewed most recent PSA with patient-2.82ng/mL  Continue Degerelix and XGeva  Continue Vit D and Ca supplements (Prosteon)  F/u as arranged by ARCHES trial    Chief Complaint   Patient presents with   ??? Elevated PSA       HPI: Pt returns to the office for enrollment in [facility]CHES trial.  Pt underwent prostate biopsy for PSA of 43. Biopsy positive for gleason 10 disease.  Bone scan showed extensive osseous metastatic disease with innumerable lesions of the axial and proximal appendicular skeleton. Pt initiated on Degarelix on 05/14/16. Initiated on XGeva 06/11/16.  He presents today in follow up for ARCHES trial.      He had a previous prostate biopsy in 2013 by Dr RMaida Salefor PSA of 5.1.  Biopsy was negative, one area of inflammation, volume 42gms. PSA on 12/26/2015 was 10.470 ng/mL. PSA  04/15/2016 is 43. DRE performed at biopsy, prostate 40 grams and nodule at the left apex. No known FH of prostate cancer. Pt previously reported continued right thigh pain, present for many weeks, significantly improving. He is no longer using a cane.    Patient reports he is tolerating both ADT injections and Xgeva well. He notes no hot flashes.  No voiding complaints. Denies hematuria, dysuria, incontinence, or incomplete bladderr emptying. Currently asymptomatic for infections. Good  appetite and stable weight. Denies bone, back, or abdominal pain.    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    PSA /TESTOSTERONE - BSHSI PSA   Latest Ref Rng & Units 0.00 - 4.00 ng/mL   04/15/2016 43.03 (H)   04/19/2015 6.09 (H)     Past Medical History:   Diagnosis Date   ??? CAD (coronary artery disease) 2005, 2015    CABG 2005, multiple stents, most recently in 05/2014   ??? Diabetes ([place]    ??? DM (diabetes mellitus screen)    ??? GERD (gastroesophageal reflux disease)    ??? HTN (hypertension)    ??? Liver disorder    ??? [place]     cT2bN0M1b Prostate Cancer w/ bone mets   ??? Thyroid disease          Past Surgical History:   Procedure Laterality Date   ??? CARDIAC SURG PROCEDURE UNLIST       cardiac bypass 2005 stent placement   ??? HX CATARACT REMOVAL Left 08/16/2015   ??? HX CHOLECYSTECTOMY  1974   ??? 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 HX SPLENECTOMY           Allergies   Allergen  Reactions   ??? Naproxen Hives         Current Outpatient Prescriptions:   ???  triamcinolone acetonide (KENALOG) 0.1 % ointment, apply to affected area three times a day for 5 days, Disp: , Rfl: 0  ???  metoprolol succinate (TOPROL-XL) 50 mg XL tablet, , Disp: , Rfl:   ???  nitroglycerin (NITROSTAT) 0.4 mg SL tablet, , Disp: , Rfl:   ???  isosorbide dinitrate (ISORDIL) 30 mg tablet, Take 30 mg by mouth four (4) times daily., Disp: , Rfl:   ???  gabapentin (NEURONTIN) 300 mg capsule, , Disp: , Rfl:   ???  glimepiride (AMARYL) 1 mg tablet, , Disp: , Rfl:   ???  simvastatin (ZOCOR) 40 mg tablet, , Disp: , Rfl:   ???  tamsulosin (FLOMAX) 0.4 mg capsule, , Disp: , Rfl:   ???  clopidogrel (PLAVIX) 75 mg tablet, , Disp: , Rfl:   ???  METFORMIN HCL (METFORMIN PO), Take  by mouth., Disp: , Rfl:   ???  LEVOTHYROXINE SODIUM (LEVOTHYROXINE PO), Take  by mouth., Disp: , Rfl:   ???  losartan (COZAAR) 50 mg tablet, Take  by mouth daily., Disp: , Rfl:    ???  aspirin 81 mg tablet, Take 81 mg by mouth., Disp: , Rfl:     History reviewed. No pertinent family history.  Social History     Social History   ??? Marital status: SINGLE     Spouse name: N/A   ??? Number of children: N/A   ??? Years of education: N/A     Social History Main Topics   ??? Smoking status: Former Smoker   ??? Smokeless tobacco: Never Used   ??? Alcohol use No   ??? Drug use: No   ??? Sexual activity: Not Asked     Other Topics Concern   ??? None     Social History Narrative         Review of Systems  Constitutional: Fever: No  Skin: Rash: No  HEENT: Hearing difficulty: No  Eyes: Blurred vision: No  Cardiovascular: Chest pain: No  Respiratory: Shortness of breath: No  Gastrointestinal: Nausea/vomiting: No  Musculoskeletal: Back pain: No  Neurological: Weakness: No  Psychological: Memory loss: No  Comments/additional findings:     PE:  Visit Vitals   ??? BP 102/60   ??? Pulse 80   ??? Temp 98.4 ??F (36.9 ??C)   ??? Resp 12   ??? Ht 5' 9"  (1.753 m)   ??? Wt 165 lb (74.8 kg)   ??? BMI 24.37 kg/m2     Constitutional: WDWN, Pleasant and appropriate affect, No acute distress.    CV:  No peripheral swelling noted, RRR  Respiratory: No respiratory distress or difficulties, CTAB  Abdomen:  No abdominal masses or tenderness.  No CVA tenderness. No hernias noted.   GU Male:    DPO:EUMPNTIR  Skin: No jaundice.    Neuro/Psych:  Alert and oriented x 3. Affect appropriate.       CT A/P WO cont 04/19/2016;  Impression:   1. Findings concerning for new osseous metastasis.  Bone scan recommended for further assessment.     2.  Interval postsurgical changes of the distal pancreas and spleen.     3.  No obvious metastasis to the liver, though evaluation is limited on a noncontrast study.  No significant abdominal or pelvic adenopathy.     4.  Small fat containing a focal hernia and patulous left inguinal ring.  5.  Enlarged prostate.    -Bone Scan 05/01/2016  Impression:  Extensive osseous metastatic disease with innumerable lesions of the axial  and proximal appendicular skeleton.    Prostate Biopsy 05/14/16:  SURGICAL PATHOLOGY REPORT   ??   CLINICAL HISTORY/INFORMATION:    Previous Biopsy Result: Negative 2013   Treatment: Observation   DRE: Prostate 40 grams and nodule at the left apex   Other Pertinent Clinical History: DNA Kit# AC [id number]   Last PSA Result: Rising ~ 43.03 on 04/15/2016   Number of Containers Submitted: 6        ??   DIAGNOSIS:   A. Right Apex Needle biopsy ??   ?? ?? ?? ?? ?? ?? 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 ?? ?? ?? ?? ?? ??(1 of 1 core(s) positive). Grade Group 5. Tumor involves one end.   B. Right Mid Needle biopsy ??   ?? ?? ?? ?? ?? ?? ??ADENOCARCINOMA, GLEASON SCORE 5 + 5 = 10 involving 40 % of the specimen (1 of 1 core(s) positive).   ?? ?? ?? ?? ?? ?? ??Grade Group 5. Tumor involves one end.   C. Right Base Needle biopsy ??   ?? ?? ?? ?? ?? ?? ??ADENOCARCINOMA, GLEASON SCORE 5 + 4 = 9 involving 3 % of the specimen (1 of 1 core(s) positive).   ?? ?? ?? ?? ?? ?? ??Grade Group 5. Ends not involved by the tumor.   D. Left Apex Needle biopsy ??   ?? ?? ?????? ?? ?? ??Benign prostatic tissue.   E. Left Mid Needle biopsy ??   ?? ?? ?? ?? ?? ?? ??Benign prostatic tissue.   F. Left Base Needle biopsy ??   ?? ?? ?? ?? ?? ?? ??Benign prostatic tissue.     Herbie Wall Jazlynn Nemetz MD   Urologic Oncologist  [facility] Hermel[facility] MD Professorship  Professor of Urology  [facility]  Office [phone] Ext [phone]      [id number] Candyce Churn, MD    Medical documentation provided with the assistance of Georges Mouse, medical scribe to Okey Regal Olimpia Tinch, MD on 07/03/2016.

## 2016-07-03 NOTE — Progress Notes (Signed)
PROGRESS NOTE: [facility] Day 1 VISIT  Sponsor:  [employer]  Protocol: [id number]  Subject: [id number]  Date of Visit: 03 July 2016      VISIT TYPE    Subject [phone] presents today for visit Day 1 in the [facility] clinical trial.  All questionnaires have been completed by the patient prior to any procedures being performed per protocol.      Vitals were performed: Blood pressure 102/60, pulse 80, temperature 98.4 ??F (36.9 ??C), resp. rate 12, height 5\' 9"  (1.753 m), weight 165 lb (74.8 kg).    A physical exam and ECOG assessment for this visit were performed by Daylene Katayama, M.D. with no abnormalities observed on the physical exam and and ECOG of 0 assessed.  CT and Bone Scan were reviewed with the patient by Daylene Katayama, M.D.     INVESTIGATIONAL PRODUCT:  IP was dispensed to the patient and dosing instructions were reviewed with the subject.  He expressed understanding.      ADVERSE EVENTS:   Patient indicates previous AEs are ongoing/resolved.  No new AEs were reported.    MEDICAL CHANGES:  Patient continues to take all previously disclosed medications and has not added any new medications.    The subject states he remains eligible and committed to actively continue in this clinical trial.  His next appointment is 01 August 2016.  Until then, he confirms he will contact us if any problems or questions arise.    Briscoe Burns, MA, CRC  Research Coordinator    FOR FULL DOCUMENTATION OF THIS CLINICAL TRIAL VISIT, PLEASE SEE THE SOURCE DOCUMENT, AS THIS IS THE FIRST PLACE VISIT INFORMATION FROM CLINICAL TRIAL VISITS IS RECORDED.

## 2016-07-09 ENCOUNTER — Institutional Professional Consult (permissible substitution): Admit: 2016-07-09 | Discharge: 2016-07-09 | Payer: MEDICARE | Primary: Family Medicine

## 2016-07-09 DIAGNOSIS — C61 Malignant neoplasm of prostate: Secondary | ICD-10-CM

## 2016-07-09 MED ORDER — DEGARELIX 80 MG SUB-Q SOLN
80 mg | Freq: Once | SUBCUTANEOUS | 0 refills | Status: AC
Start: 2016-07-09 — End: 2016-07-09

## 2016-07-09 MED ORDER — DENOSUMAB 120 MG/1.7 ML (70 MG/ML) SUB-Q
120 mg/1.7 mL (70 mg/mL) | Freq: Once | SUBCUTANEOUS | 0 refills | Status: AC
Start: 2016-07-09 — End: 2016-07-09

## 2016-07-09 NOTE — Progress Notes (Signed)
John Higgins is a 75 y.o. male who is here today per the order of Dr. Given to receive degarleix and xgeva .   Patient's identity has been verified.   Dr. Quentin Ore was available in the clinic as incident to provider.     Patient has not had any dental procedures since his last injection.  Patient denies any upcoming dental procedures.  Patient  denies jaw pain.  Dental Clearance letter on file is up to date.   Date of last Dental Clearance:  Dentures    Patient has been taking supplements of at least 500 mg of calcium and 400 international units of Vitamin D daily.  This is not his first ADT injection.  This is not his first DENOSUMAB injection.  He has had the necessary labs drawn.  Research     Degarelix is administered to abdomen ( right) SQ without difficulty.     Patient tolerated the injection well.    Patient has been scheduled for the next injection which will be due in approximately 1 months    [name] is administered to abdomen ( left) SQ without difficulty.     Patient tolerated the injection well.    Patient has been scheduled for the next injection which will be due in approximately 1 months  Patient has a follow-up appointment with provider on August 01, 2016      Encounter Diagnoses   Name Primary?   ??? Prostate cancer metastatic to multiple sites [facility]) Yes   ??? Bone metastases ([place])        Orders Placed This Encounter   ??? THER/PROPH/DIAG INJECTION, SUBCUT/IM   ??? CHEMOTHER HORMON ANTINEOPL SUB-Q/IM   ??? PR DEGARELIX INJECTION     Order Specific Question:   Charge Quantity?     Answer:   58     Order Specific Question:   Dose     Answer:   80mg      Order Specific Question:   Site     Answer:   RIGHT LOWER QUAD ABDOMEN     Order Specific Question:   Expiration Date     Answer:   05/04/2018     Order Specific Question:   Lot#     Answer:   [id number]     Order Specific Question:   Manufacturer     Answer:   [employer]     Order Specific Question:   Perfomed by/Witnessed by:     Answer:   AA      Order Specific Question:   NDC#     AnswerRL:[id number]   ??? PR DENOSUMAB INJECTION     Order Specific Question:   Charge Quantity?     Answer:   120     Order Specific Question:   Dose     Answer:   120mg      Comments:   g     Order Specific Question:   Site     Answer:   LEFT LOWER QUAD ABDOMEN     Order Specific Question:   Expiration Date     Answer:   05/04/2018     Order Specific Question:   Lot#     Answer:   [id number]     Order Specific Question:   Manufacturer     Answer:   [employer]     Order Specific Question:   Perfomed by/Witnessed by:     Answer:   AA     Order Specific Question:  NDC#     Answer:   [id number]   ??? degarelix (FIRMAGON) 80 mg solr injection     Sig: 80 mg by SubCUTAneous route once for 1 dose.     Dispense:  1 Each     Refill:  0   ??? denosumab (XGEVA) soln injection     Sig: 1.7 mL by SubCUTAneous route once for 1 dose.     Dispense:  1.7 mL     Refill:  0       Patient did not supply own medication.    John Higgins       Reviewed by Young Berry, MD on 07/09/2016 and agree with the above.

## 2016-08-01 ENCOUNTER — Ambulatory Visit: Admit: 2016-08-01 | Discharge: 2016-08-01 | Payer: MEDICARE | Attending: Physician Assistant | Primary: Family Medicine

## 2016-08-01 ENCOUNTER — Encounter: Attending: Physician Assistant | Primary: Family Medicine

## 2016-08-01 DIAGNOSIS — C61 Malignant neoplasm of prostate: Secondary | ICD-10-CM

## 2016-08-01 LAB — PSA, DIAGNOSTIC (PROSTATE SPECIFIC AG): Prostate Specific Ag: 1.47

## 2016-08-01 NOTE — Progress Notes (Signed)
PT is scheduled for NM Bone Scan and CT AP for Monday, September 23, 2016 to arrive at [facility] [place] at 9:45am. PT left with EZCAT prep. No Diabetic pills on November 20,21,22. PT is aware.

## 2016-08-01 NOTE — Progress Notes (Signed)
PROGRESS NOTE: [facility] Week 5 VISIT  Sponsor:  [employer]  Protocol: [id number]  John: [id number]  Date of Visit: 01 August 2016      VISIT TYPE    John Higgins presents today for visit Week 5 in the [facility] clinical trial.   Vitals were performed:  Blood pressure 130/60, pulse 70, temperature 98.4 ??F (36.9 ??C), resp. rate 12, height 5\' 9"  (1.753 m), weight 171 lb (77.6 kg).    A physical exam and ECOG assessment for this visit were performed by Wynn Banker, PA-C with no abnormalities observed on the physical exam and and ECOG of 0 assessed.      IP Accountability:  The patient returned all used/unused IP and accountability was taken.    INVESTIGATIONAL PRODUCT:  IP was dispensed to the patient and dosing instructions were reviewed with the John.  John Higgins expressed understanding.      ADVERSE EVENTS:   Patient indicates previous AEs are ongoing/resolved.  No new AEs were reported.    MEDICAL CHANGES:  Patient continues to take all previously disclosed medications and has not added any new medications.    The John states John Higgins remains eligible and committed to actively continue in this clinical trial.  His next appointment is September 25, 2016.  Until then, John Higgins confirms John Higgins will contact us if any problems or questions arise.    John Burns, MA, CRC  Research Coordinator    FOR FULL DOCUMENTATION OF THIS CLINICAL TRIAL VISIT, PLEASE SEE THE SOURCE DOCUMENT, AS THIS IS THE FIRST PLACE VISIT INFORMATION FROM CLINICAL TRIAL VISITS IS RECORDED.

## 2016-08-01 NOTE — Progress Notes (Addendum)
John Higgins  [birth year]      Impression:  Encounter Diagnoses     ICD-10-CM ICD-9-CM   1. Prostate cancer metastatic to multiple sites ([place]) C61 185     199.0   2. 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:   -Most recent bone scan and CT scan on 04/2016 showed extensive osseous metastatic disease   - Initiated on Degerelix 32m 05/14/16   -Started XDelton See8/8/17 (pt edentulous, so no dental clearance needed)   -Enrolled in [facility] trial 06/21/16   -PSA 2.75 as of 07/03/2016    Significant CAD history - CABG 2005, multiple coronary stents most recently 05/2014, managed by Dr PJerline Pain   Plan:  Reviewed most recent PSA with patient-2.75ng/mL  Continue Degarelix and XGeva, injections scheduled for 08/06/16  Continue Vit D and Ca supplements (Prosteon)  Discussed increasing exercise and watching diet- recommend low fat diet  F/u as arranged by [facility] trial    Chief Complaint   Patient presents with   ??? Elevated PSA       HPI: Pt returns to the office for participation in [facility] trial.  Pt underwent prostate biopsy for PSA of 43. Biopsy positive for gleason 10 disease.  Bone scan showed extensive osseous metastatic disease with innumerable lesions of the axial and proximal appendicular skeleton. Pt initiated on Degarelix on 05/14/16. Initiated on XGeva 06/11/16. Most recent injections given 07/09/16.    He had a previous prostate biopsy in 2013 by Dr RMaida Salefor PSA of 5.1.  Biopsy was negative, one area of inflammation, volume 42gms. PSA on 12/26/2015 was 10.470 ng/mL. PSA  04/15/2016 is 43. DRE performed at biopsy, prostate 40 grams and nodule at the left apex. No known FH of prostate cancer. Pt previously reported continued right thigh pain, present for many weeks, pain resolved with treatment. He is no longer using a cane.    Patient reports he is tolerating both ADT injections and Xgeva well. He notes no hot flashes.   No voiding complaints. Denies hematuria, dysuria, incontinence, or incomplete bladderr emptying. Currently asymptomatic for infections. Good appetite and stable weight. Denies bone, back, or abdominal pain.    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      Past Medical History:   Diagnosis Date   ??? CAD (coronary artery disease) 2005, 2015    CABG 2005, multiple stents, most recently in 05/2014   ??? [place]    ??? DM (diabetes mellitus screen)    ??? GERD (gastroesophageal reflux disease)    ??? HTN (hypertension)    ??? Liver disorder    ??? [place]     cT2bN0M1b Prostate Cancer w/ bone mets   ??? Thyroid disease          Past Surgical History:   Procedure Laterality Date   ??? CARDIAC SURG PROCEDURE UNLIST       cardiac bypass 2005 stent placement   ??? HX CATARACT REMOVAL Left 08/16/2015   ??? HX CHOLECYSTECTOMY  1974   ??? 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 HX SPLENECTOMY           Allergies   Allergen Reactions   ??? Naproxen Hives         Current Outpatient Prescriptions:   ???  triamcinolone acetonide (KENALOG) 0.1 % ointment, apply to affected area three times a day for 5 days, Disp: , Rfl: 0  ???  metoprolol succinate (TOPROL-XL) 50 mg XL tablet, , Disp: , Rfl:   ???  nitroglycerin (NITROSTAT) 0.4 mg SL tablet, , Disp: , Rfl:   ???  isosorbide dinitrate (ISORDIL) 30 mg tablet, Take 30 mg by mouth four (4) times daily., Disp: , Rfl:   ???  gabapentin (NEURONTIN) 300 mg capsule, , Disp: , Rfl:   ???  glimepiride (AMARYL) 1 mg tablet, , Disp: , Rfl:   ???  simvastatin (ZOCOR) 40 mg tablet, , Disp: , Rfl:   ???  tamsulosin (FLOMAX) 0.4 mg capsule, , Disp: , Rfl:   ???  clopidogrel (PLAVIX) 75 mg tablet, , Disp: , Rfl:   ???  METFORMIN HCL (METFORMIN PO), Take  by mouth., Disp: , Rfl:   ???  LEVOTHYROXINE SODIUM (LEVOTHYROXINE PO), Take  by mouth., Disp: , Rfl:   ???  losartan (COZAAR) 50 mg tablet, Take  by mouth daily., Disp: , Rfl:    ???  aspirin 81 mg tablet, Take 81 mg by mouth., Disp: , Rfl:     History reviewed. No pertinent family history.  Social History     Social History   ??? Marital status: SINGLE     Spouse name: N/A   ??? Number of children: N/A   ??? Years of education: N/A     Social History Main Topics   ??? Smoking status: Former Smoker   ??? Smokeless tobacco: Never Used   ??? Alcohol use No   ??? Drug use: No   ??? Sexual activity: Not Asked     Other Topics Concern   ??? None     Social History Narrative         Review of Systems  Constitutional: Fever: No  Skin: Rash: No  HEENT: Hearing difficulty: No  Eyes: Blurred vision: No  Cardiovascular: Chest pain: No  Respiratory: Shortness of breath: No  Gastrointestinal: Nausea/vomiting: No  Musculoskeletal: Back pain: No  Neurological: Weakness: No  Psychological: Memory loss: No  Comments/additional findings:     PE:  There were no vitals taken for this visit.  Constitutional: WDWN, Pleasant and appropriate affect, No acute distress.    CV:  No peripheral swelling noted, RRR  Respiratory: No respiratory distress or difficulties, CTAB  Abdomen:  No abdominal masses or tenderness.  No CVA tenderness. No hernias noted.   GU Male:    IRC:[id number]  Skin: No jaundice.    Neuro/Psych:  Alert and oriented x 3. Affect appropriate.       CT A/P WO cont 04/19/2016;  Impression:   1. Findings concerning for new osseous metastasis.  Bone scan recommended for further assessment.     2.  Interval postsurgical changes of the distal pancreas and spleen.     3.  No obvious metastasis to the liver, though evaluation is limited on a noncontrast study.  No significant abdominal or pelvic adenopathy.     4.  Small fat containing a focal hernia and patulous left inguinal ring.     5.  Enlarged prostate.    -Bone Scan 05/01/2016  Impression:  Extensive osseous metastatic disease with innumerable lesions of the axial and proximal appendicular skeleton.    Prostate Biopsy 05/14/16:  SURGICAL PATHOLOGY REPORT   ??    CLINICAL HISTORY/INFORMATION:    Previous Biopsy Result: Negative 2013   Treatment: Observation   DRE: Prostate  40 grams and nodule at the left apex   Other Pertinent Clinical History: DNA Kit# AC [id number]   Last PSA Result: Rising ~ 43.03 on 04/15/2016   Number of Containers Submitted: 6        ??   DIAGNOSIS:   A. Right Apex Needle biopsy ??   ?? ?? ?? ?? ?? ?? 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 ?? ?? ?? ?? ?? ??(1 of 1 core(s) positive). Grade Group 5. Tumor involves one end.   B. Right Mid Needle biopsy ??   ?? ?? ?? ?? ?? ?? ??ADENOCARCINOMA, GLEASON SCORE 5 + 5 = 10 involving 40 % of the specimen (1 of 1 core(s) positive).   ?? ?? ?? ?? ?? ?? ??Grade Group 5. Tumor involves one end.   C. Right Base Needle biopsy ??   ?? ?? ?? ?? ?? ?? ??ADENOCARCINOMA, GLEASON SCORE 5 + 4 = 9 involving 3 % of the specimen (1 of 1 core(s) positive).   ?? ?? ?? ?? ?? ?? ??Grade Group 5. Ends not involved by the tumor.   D. Left Apex Needle biopsy ??   ?? ?? ?????? ?? ?? ??Benign prostatic tissue.   E. Left Mid Needle biopsy ??   ?? ?? ?? ?? ?? ?? ??Benign prostatic tissue.   F. Left Base Needle biopsy ??   ?? ?? ?? ?? ?? ?? ??Benign prostatic tissue.     Thresa Ross, PA      GG:YIRSWNIO Candyce Churn, MD      The history, physical and relevant labs/imaging were reviewed by Young Berry, MD on 08/01/2016 and agree with the physician assistant's assessment and plan.    Young Berry MD  Assistant Professor, Dept of Urology  [facility]  [facility] [place] [place]  Pager #: [phone]

## 2016-08-06 ENCOUNTER — Institutional Professional Consult (permissible substitution): Admit: 2016-08-06 | Discharge: 2016-08-06 | Payer: MEDICARE | Primary: Family Medicine

## 2016-08-06 DIAGNOSIS — C61 Malignant neoplasm of prostate: Secondary | ICD-10-CM

## 2016-08-06 MED ORDER — DEGARELIX 80 MG SUB-Q SOLN
80 mg | Freq: Once | SUBCUTANEOUS | 0 refills | Status: AC
Start: 2016-08-06 — End: 2016-08-06

## 2016-08-06 MED ORDER — DENOSUMAB 120 MG/1.7 ML (70 MG/ML) SUB-Q
120 mg/1.7 mL (70 mg/mL) | Freq: Once | SUBCUTANEOUS | 0 refills | Status: AC
Start: 2016-08-06 — End: 2016-08-06

## 2016-08-06 NOTE — Progress Notes (Addendum)
John Higgins is a 75 y.o. male who is here today per the order of Dr. Given to receive degarelix and xgeva, and to have labs drawn.   Patient's identity has been verified.   Dr. Gearldine Bienenstock was available in the clinic as incident to provider.     Patient has not had any dental procedures since his last injection.  Patient denies any upcoming dental procedures.  Patient  denies jaw pain.  Dental Clearance letter on file is up to date.   Date of last Dental Clearance: Dentures    Patient has been taking supplements of at least 500 mg of calcium and 400 international units of Vitamin D daily.  This is not his first ADT injection.  This is not his first DENOSUMAB injection.  He has had the necessary labs drawn.  PSA, TST, Calcium and Vitamin D obtained via venipuncture without any difficulty.  Patient will be notified by the physician with lab results.     Degarelix is administered to abdomen ( left) SQ without difficulty.     Patient tolerated the injection well.    Patient has been scheduled for the next injection which will be due in approximately 1 months    [name] is administered to abdomen ( Right ) SQ without difficulty.     Patient tolerated the injection well.    Patient has been scheduled for the next injection which will be due in approximately 1 months  Patient has a follow-up appointment with provider on September 24, 2016      Encounter Diagnoses   Name Primary?   ??? Prostate cancer metastatic to multiple sites [facility]) Yes   ??? Vitamin D deficiency    ??? Bone metastasis ([place])        Orders Placed This Encounter   ??? THER/PROPH/DIAG INJECTION, SUBCUT/IM   ??? PROSTATE SPECIFIC ANTIGEN, TOTAL (PSA)   ??? TESTOSTERONE   ??? VITAMIN D, 25 HYDROXY   ??? CALCIUM   ??? CHEMOTHER HORMON ANTINEOPL SUB-Q/IM   ??? PR DEGARELIX INJECTION     Order Specific Question:   Charge Quantity?     Answer:   35     Order Specific Question:   Dose     Answer:   80mg      Order Specific Question:   Site     Answer:   LEFT LOWER QUAD ABDOMEN      Order Specific Question:   Expiration Date     Answer:   05/04/2018     Order Specific Question:   Lot#     Answer:   [id number]     Order Specific Question:   Manufacturer     Answer:   Ferring     Order Specific Question:   Perfomed by/Witnessed by:     Answer:   AA     Order Specific Question:   NDC#     [id number]   ??? PR DENOSUMAB INJECTION     Order Specific Question:   Charge Quantity?     Answer:   120     Order Specific Question:   Dose     Answer:   120mg      Order Specific Question:   Site     Answer:   RIGHT LOWER QUAD ABDOMEN     Order Specific Question:   Expiration Date     Answer:   05/04/2018     Order Specific Question:   Lot#     Answer:  [id number]     Order Specific Question:   Manufacturer     Answer:   [employer]     Order Specific Question:   Perfomed by/Witnessed by:     Answer:   AA     Order Specific Question:   NDC#     [id number]   ??? PR COLLECTION VENOUS BLOOD,VENIPUNCTURE   ??? degarelix (FIRMAGON) 80 mg solr injection     Sig: 80 mg by SubCUTAneous route once for 1 dose.     Dispense:  1 Each     Refill:  0   ??? denosumab (XGEVA) soln injection     Sig: 1.7 mL by SubCUTAneous route once for 1 dose.     Dispense:  1.7 mL     Refill:  0       Patient did not supply own medication.         Note reviewed and I concur with plan.    Herbie Iliff Given MD   Urologic Oncologist  [facility] Hermelinda [facility] MD Professorship  Professor of Urology  [employer]  Office [phone] Ext [phone]

## 2016-08-07 ENCOUNTER — Encounter: Attending: Urology | Primary: Family Medicine

## 2016-08-07 LAB — CALCIUM: Calcium: 10.2 mg/dL (ref 8.6–10.2)

## 2016-08-07 LAB — TESTOSTERONE: Testosterone: <3 ng/dL — ABNORMAL LOW (ref 348–1197)

## 2016-08-07 LAB — PROSTATE SPECIFIC ANTIGEN, TOTAL (PSA): Prostate Specific Ag: 2.01 ng/mL (ref 0.00–4.00)

## 2016-08-08 NOTE — Progress Notes (Signed)
Letter sent

## 2016-08-12 LAB — VITAMIN D, 25 HYDROXY: VITAMIN D, 25-HYDROXY: 16.8 ng/ml — CL (ref 30.0–100.0)

## 2016-08-14 NOTE — Progress Notes (Signed)
Letter sent

## 2016-08-20 ENCOUNTER — Encounter

## 2016-08-30 ENCOUNTER — Encounter: Attending: Urology | Primary: Family Medicine

## 2016-09-03 ENCOUNTER — Institutional Professional Consult (permissible substitution): Admit: 2016-09-03 | Discharge: 2016-09-03 | Payer: MEDICARE | Primary: Family Medicine

## 2016-09-03 DIAGNOSIS — C61 Malignant neoplasm of prostate: Secondary | ICD-10-CM

## 2016-09-03 MED ORDER — DEGARELIX 80 MG SUB-Q SOLN
80 mg | Freq: Once | SUBCUTANEOUS | 0 refills | Status: AC
Start: 2016-09-03 — End: 2016-09-03

## 2016-09-03 MED ORDER — DENOSUMAB 120 MG/1.7 ML (70 MG/ML) SUB-Q
120 mg/1.7 mL (70 mg/mL) | Freq: Once | SUBCUTANEOUS | 0 refills | Status: AC
Start: 2016-09-03 — End: 2016-09-03

## 2016-09-03 NOTE — Progress Notes (Signed)
John Higgins is a 75 y.o. male who is here today per the order of Dr. Given to receive degarelix and xgeva.   Patient's identity has been verified.   Dr. Claiborne Billings was available in the clinic as incident to provider.     Patient has not had any dental procedures since his last injection.  Patient denies any upcoming dental procedures.  Patient  denies jaw pain.  Dental Clearance letter on file is up to date.   Date of last Dental Clearance:  dentures    Patient has been taking supplements of at least 500 mg of calcium and 400 international units of Vitamin D daily.  This is not his first ADT injection.  This is not his first DENOSUMAB injection.  He has had the necessary labs drawn.  08/06/16 labs were drawn    degarelix is administered to abdomen ( right) SQ without difficulty.     Patient tolerated the injection well.    Patient has been scheduled for the next injection which will be due in approximately 1 months    [name] is administered to abdomen ( left) SQ without difficulty.     Patient tolerated the injection well.    Patient has been scheduled for the next injection which will be due in approximately 1 months  Patient has a follow-up appointment with provider on September 24, 2016      Encounter Diagnoses   Name Primary?   ??? Prostate cancer metastatic to multiple sites [facility]) Yes   ??? Bone metastases ([place])        Orders Placed This Encounter   ??? THER/PROPH/DIAG INJECTION, SUBCUT/IM   ??? CHEMOTHER HORMON ANTINEOPL SUB-Q/IM   ??? PR DEGARELIX INJECTION     Order Specific Question:   Charge Quantity?     Answer:   67     Order Specific Question:   Dose     Answer:   80mg      Order Specific Question:   Site     Answer:   RIGHT LOWER QUAD ABDOMEN     Order Specific Question:   Expiration Date     Answer:   12/05/2018     Order Specific Question:   Lot#     Answer:   [id number]     Order Specific Question:   Manufacturer     Answer:   Ferring     Order Specific Question:   Perfomed by/Witnessed by:     Answer:   AA      Order Specific Question:   NDC#     [id number]   ??? PR DENOSUMAB INJECTION     Order Specific Question:   Charge Quantity?     Answer:   120     Order Specific Question:   Dose     Answer:   120mg      Order Specific Question:   Site     Answer:   LEFT LOWER QUAD ABDOMEN     Order Specific Question:   Expiration Date     Answer:   06/04/2018     Order Specific Question:   Lot#     Answer:   [id number]     Order Specific Question:   Manufacturer     Answer:   [employer]     Order Specific Question:   Perfomed by/Witnessed by:     Answer:   AA     Order Specific Question:   NDC#     Answer:  [id number]?? degarelix (FIRMAGON) 80 mg solr injection     Sig: 80 mg by SubCUTAneous route once for 1 dose.     Dispense:  1 Each     Refill:  0   ??? denosumab (XGEVA) soln injection     Sig: 1.7 mL by SubCUTAneous route once for 1 dose.     Dispense:  1.7 mL     Refill:  0       Patient did not supply own medication.      Reviewed by Quinn Axe, MD

## 2016-09-24 ENCOUNTER — Ambulatory Visit: Admit: 2016-09-24 | Discharge: 2016-09-24 | Payer: MEDICARE | Attending: Urology | Primary: Family Medicine

## 2016-09-24 DIAGNOSIS — C61 Malignant neoplasm of prostate: Secondary | ICD-10-CM

## 2016-09-24 LAB — PSA, DIAGNOSTIC (PROSTATE SPECIFIC AG): Prostate Specific Ag: 1.31

## 2016-09-24 NOTE — Progress Notes (Signed)
PROGRESS NOTE: [facility] Week 13 VISIT  Sponsor:  [employer]  Protocol: [id number]  John: John Higgins  Date of Visit: 24 September 2016      VISIT TYPE    John Higgins presents today for visit Week 13 in the [facility] clinical trial.  All questionnaires have been completed by the patient prior to any procedures being performed per protocol.      Vitals were performed:  Blood pressure 120/60, pulse 66, temperature 98.2 ??F (36.8 ??C), resp. rate 14, height 5\' 9"  (1.753 m), weight 176 lb (79.8 kg).    A physical exam and ECOG assessment for this visit were performed by Wynn Banker, PA-C with no abnormalities observed on the physical exam and and ECOG of 0 assessed.  CT and Bone Scan were not available for review at time of visit.    IP Accountability:  The patient forgot to bring his used/unused IP.    INVESTIGATIONAL PRODUCT:  IP was dispensed to the patient and dosing instructions were reviewed with the John.  He expressed understanding.    ADVERSE EVENTS: Patient indicates previous AEs are ongoing/resolved.  No new AEs were reported.    MEDICAL CHANGES:  Patient continues to take all previously disclosed medications and has added two new medications.    The John states he remains eligible and committed to actively continue in this clinical trial.  His next appointment is 18 December 2016.  Until then, he confirms he will contact us if any problems or questions arise.    Briscoe Burns, MA, CRC  Research Coordinator    FOR FULL DOCUMENTATION OF THIS CLINICAL TRIAL VISIT, PLEASE SEE THE SOURCE DOCUMENT, AS THIS IS THE FIRST PLACE VISIT INFORMATION FROM CLINICAL TRIAL VISITS IS RECORDED.

## 2016-09-24 NOTE — Progress Notes (Deleted)
John Higgins  [birth year]      Impression:  No diagnosis found.  [id number] Prostate Cancer with Gleason 5+5 w/ bone mets:   -Most recent bone scan and CT scan on 04/2016 showed extensive osseous metastatic disease   - Initiated on Degerelix 66m 05/14/16   -Started XDelton See8/8/17 (pt edentulous, so no dental clearance needed)   -Enrolled in [facility]CHES trial 06/21/16   -PSA 2.75 as of 07/03/2016    Significant CAD history - CABG 2005, multiple coronary stents most recently 05/2014, managed by Dr PJerline Pain   Plan:  Reviewed most recent PSA with patient-2.75ng/mL  Continue Degarelix and XGeva, injections scheduled for 08/06/16  Continue Vit D and Ca supplements (Prosteon)  Discussed increasing exercise and watching diet- recommend low fat diet  F/u as arranged by [facility]ES trial    Chief Complaint   Patient presents with   ??? Elevated PSA       HPI: Pt returns to the office for participation in [facility]ES trial.  Pt underwent prostate biopsy for PSA of 43. Biopsy positive for gleason 10 disease.  Bone scan showed extensive osseous metastatic disease with innumerable lesions of the axial and proximal appendicular skeleton. Pt initiated on Degarelix on 05/14/16. Initiated on XGeva 06/11/16. Most recent injections given 07/09/16.    He had a previous prostate biopsy in 2013 by Dr RMaida Salefor PSA of 5.1.  Biopsy was negative, one area of inflammation, volume 42gms. PSA on 12/26/2015 was 10.470 ng/mL. PSA  04/15/2016 is 43. DRE performed at biopsy, prostate 40 grams and nodule at the left apex. No known FH of prostate cancer. Pt previously reported continued right thigh pain, present for many weeks, pain resolved with treatment. He is no longer using a cane.    Patient reports he is tolerating both ADT injections and Xgeva well. He notes no hot flashes.  No voiding complaints. Denies hematuria, dysuria, incontinence, or incomplete bladderr emptying. Currently asymptomatic for infections. Good  appetite and stable weight. Denies bone, back, or abdominal pain.    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      Past Medical History:   Diagnosis Date   ??? CAD (coronary artery disease) 2005, 2015    CABG 2005, multiple stents, most recently in 05/2014   ??? Diabetes ([place]    ??? DM (diabetes mellitus screen)    ??? GERD (gastroesophageal reflux disease)    ??? HTN (hypertension)    ??? Liver disorder    ??? Prostate cancer ([place]     cT2bN0M1b Prostate Cancer w/ bone mets   ??? Thyroid disease          Past Surgical History:   Procedure Laterality Date   ??? CARDIAC SURG PROCEDURE UNLIST       cardiac bypass 2005 stent placement   ??? HX CATARACT REMOVAL Left 08/16/2015   ??? HX CHOLECYSTECTOMY  1974   ??? 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 HX SPLENECTOMY           Allergies   Allergen Reactions   ??? Naproxen Hives         Current Outpatient Prescriptions:   ???  triamcinolone acetonide (KENALOG) 0.1 % ointment, apply to affected area three times a day for 5 days, Disp: , Rfl: 0  ???  metoprolol succinate (TOPROL-XL) 50 mg XL  tablet, , Disp: , Rfl:   ???  nitroglycerin (NITROSTAT) 0.4 mg SL tablet, , Disp: , Rfl:   ???  isosorbide dinitrate (ISORDIL) 30 mg tablet, Take 30 mg by mouth four (4) times daily., Disp: , Rfl:   ???  gabapentin (NEURONTIN) 300 mg capsule, , Disp: , Rfl:   ???  glimepiride (AMARYL) 1 mg tablet, , Disp: , Rfl:   ???  simvastatin (ZOCOR) 40 mg tablet, , Disp: , Rfl:   ???  tamsulosin (FLOMAX) 0.4 mg capsule, , Disp: , Rfl:   ???  clopidogrel (PLAVIX) 75 mg tablet, , Disp: , Rfl:   ???  METFORMIN HCL (METFORMIN PO), Take  by mouth., Disp: , Rfl:   ???  LEVOTHYROXINE SODIUM (LEVOTHYROXINE PO), Take  by mouth., Disp: , Rfl:   ???  losartan (COZAAR) 50 mg tablet, Take  by mouth daily., Disp: , Rfl:   ???  aspirin 81 mg tablet, Take 81 mg by mouth., Disp: , Rfl:     History reviewed. No pertinent family history.  Social History      Social History   ??? Marital status: SINGLE     Spouse name: N/A   ??? Number of children: N/A   ??? Years of education: N/A     Social History Main Topics   ??? Smoking status: Former Smoker   ??? Smokeless tobacco: Never Used   ??? Alcohol use No   ??? Drug use: No   ??? Sexual activity: Not Asked     Other Topics Concern   ??? None     Social History Narrative         Review of Systems  Constitutional: Fever: No  Skin: Rash: No  HEENT: Hearing difficulty: No  Eyes: Blurred vision: No  Cardiovascular: Chest pain: No  Respiratory: Shortness of breath: No  Gastrointestinal: Nausea/vomiting: No  Musculoskeletal: Back pain: No  Neurological: Weakness: No  Psychological: Memory loss: No  Comments/additional findings:     PE:  There were no vitals taken for this visit.  Constitutional: WDWN, Pleasant and appropriate affect, No acute distress.    CV:  No peripheral swelling noted, RRR  Respiratory: No respiratory distress or difficulties, CTAB  Abdomen:  No abdominal masses or tenderness.  No CVA tenderness. No hernias noted.   GU Male:    OEV:[id number]  Skin: No jaundice.    Neuro/Psych:  Alert and oriented x 3. Affect appropriate.       CT A/P WO cont 04/19/2016;  Impression:   1. Findings concerning for new osseous metastasis.  Bone scan recommended for further assessment.     2.  Interval postsurgical changes of the distal pancreas and spleen.     3.  No obvious metastasis to the liver, though evaluation is limited on a noncontrast study.  No significant abdominal or pelvic adenopathy.     4.  Small fat containing a focal hernia and patulous left inguinal ring.     5.  Enlarged prostate.    -Bone Scan 05/01/2016  Impression:  Extensive osseous metastatic disease with innumerable lesions of the axial and proximal appendicular skeleton.    Prostate Biopsy 05/14/16:  SURGICAL PATHOLOGY REPORT   ??   CLINICAL HISTORY/INFORMATION:    Previous Biopsy Result: Negative 2013   Treatment: Observation    DRE: Prostate 40 grams and nodule at the left apex   Other Pertinent Clinical History: DNA Kit# AC [id number]   Last PSA Result: Rising ~ 43.03 on 04/15/2016  Number of Containers Submitted: 6        ??   DIAGNOSIS:   A. Right Apex Needle biopsy ??   ?? ?? ?? ?? ?? ?? 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 ?? ?? ?? ?? ?? ??(1 of 1 core(s) positive). Grade Group 5. Tumor involves one end.   B. Right Mid Needle biopsy ??   ?? ?? ?? ?? ?? ?? ??ADENOCARCINOMA, GLEASON SCORE 5 + 5 = 10 involving 40 % of the specimen (1 of 1 core(s) positive).   ?? ?? ?? ?? ?? ?? ??Grade Group 5. Tumor involves one end.   C. Right Base Needle biopsy ??   ?? ?? ?? ?? ?? ?? ??ADENOCARCINOMA, GLEASON SCORE 5 + 4 = 9 involving 3 % of the specimen (1 of 1 core(s) positive).   ?? ?? ?? ?? ?? ?? ??Grade Group 5. Ends not involved by the tumor.   D. Left Apex Needle biopsy ??   ?? ?? ?????? ?? ?? ??Benign prostatic tissue.   E. Left Mid Needle biopsy ??   ?? ?? ?? ?? ?? ?? ??Benign prostatic tissue.   F. Left Base Needle biopsy ??   ?? ?? ?? ?? ?? ?? ??Benign prostatic tissue.     John Higgins      RD:[id number] Candyce Churn, MD

## 2016-09-24 NOTE — Progress Notes (Signed)
John Higgins  [birth year]      Impression:  Encounter Diagnoses     ICD-10-CM ICD-9-CM   1. Prostate cancer metastatic to multiple sites ([place]) C61 185     199.0   2. 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:   -Most recent bone scan and CT scan on 04/2016 showed extensive osseous metastatic disease   - Initiated on Degerelix 05/14/16   -Started [name] 06/11/16 (pt edentulous, so no dental clearance needed)   -Enrolled in [facility] trial 06/21/16   -PSA 2.01 as of 08/06/2016    Significant CAD history - CABG 2005, multiple coronary stents most recently 05/2014, managed by Dr Jerline Pain    Plan:  Reviewed most recent PSA with patient-2.01ng/mL  Continue Degarelix and XGeva, injections   Continue Vit D and Ca supplements (Prosteon)  Discussed increasing exercise and watching diet- recommend low fat diet  F/u as arranged by [facility] trial    Chief Complaint   Patient presents with   ??? Prostate Cancer       HPI: Pt returns to the office for participation in [facility] trial.  Pt underwent prostate biopsy for PSA of 43. Biopsy positive for gleason 10 disease.  Bone scan showed extensive osseous metastatic disease with innumerable lesions of the axial and proximal appendicular skeleton. Pt initiated on Degarelix on 05/14/16. Initiated on XGeva 06/11/16. Most recent injections John Higgins 09/03/16.    He had a previous prostate biopsy in 2013 by Dr Maida Sale for PSA of 5.1.  Biopsy was negative, one area of inflammation, volume 42gms. PSA on 12/26/2015 was 10.470 ng/mL. PSA  04/15/2016 is 43. DRE performed at biopsy, prostate 40 grams and nodule at the left apex. No known FH of prostate cancer. Pt previously reported continued right thigh pain, present for many weeks, pain resolved with treatment. He is no longer using a cane.    Patient reports he is tolerating both ADT injections and Xgeva well. He notes no hot flashes.  No voiding complaints. Denies hematuria, dysuria, incontinence, or  incomplete bladderr emptying. Currently asymptomatic for infections. Good appetite and stable weight. Denies bone, back, or abdominal pain.    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      Past Medical History:   Diagnosis Date   ??? CAD (coronary artery disease) 2005, 2015    CABG 2005, multiple stents, most recently in 05/2014   ??? Diabetes ([place])    ??? DM (diabetes mellitus screen)    ??? GERD (gastroesophageal reflux disease)    ??? HTN (hypertension)    ??? Liver disorder    ??? Prostate cancer ([place])     cT2bN0M1b Prostate Cancer w/ bone mets   ??? Thyroid disease          Past Surgical History:   Procedure Laterality Date   ??? CARDIAC SURG PROCEDURE UNLIST       cardiac bypass 2005 stent placement   ??? HX CATARACT REMOVAL Left 08/16/2015   ??? HX CHOLECYSTECTOMY  1974   ??? 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 HX SPLENECTOMY           Allergies   Allergen Reactions   ??? Naproxen Hives         Current Outpatient Prescriptions:   ???  triamcinolone acetonide (KENALOG) 0.1 % ointment, apply to affected area three times a day for 5 days, Disp: , Rfl: 0  ???  metoprolol succinate (TOPROL-XL) 50 mg XL tablet, , Disp: , Rfl:   ???  nitroglycerin (NITROSTAT) 0.4 mg SL tablet, , Disp: , Rfl:   ???  isosorbide dinitrate (ISORDIL) 30 mg tablet, Take 30 mg by mouth four (4) times daily., Disp: , Rfl:   ???  gabapentin (NEURONTIN) 300 mg capsule, , Disp: , Rfl:   ???  glimepiride (AMARYL) 1 mg tablet, , Disp: , Rfl:   ???  simvastatin (ZOCOR) 40 mg tablet, , Disp: , Rfl:   ???  tamsulosin (FLOMAX) 0.4 mg capsule, , Disp: , Rfl:   ???  clopidogrel (PLAVIX) 75 mg tablet, , Disp: , Rfl:   ???  METFORMIN HCL (METFORMIN PO), Take  by mouth., Disp: , Rfl:   ???  LEVOTHYROXINE SODIUM (LEVOTHYROXINE PO), Take  by mouth., Disp: , Rfl:   ???  losartan (COZAAR) 50 mg tablet, Take  by mouth daily., Disp: , Rfl:   ???  aspirin 81 mg tablet, Take 81 mg by mouth., Disp: , Rfl:      History reviewed. No pertinent family history.  Social History     Social History   ??? Marital status: SINGLE     Spouse name: N/A   ??? Number of children: N/A   ??? Years of education: N/A     Social History Main Topics   ??? Smoking status: Former Smoker   ??? Smokeless tobacco: Never Used   ??? Alcohol use No   ??? Drug use: No   ??? Sexual activity: Not Asked     Other Topics Concern   ??? None     Social History Narrative         Review of Systems  Constitutional: Fever: No  Skin: Rash: No  HEENT: Hearing difficulty: No  Eyes: Blurred vision: No  Cardiovascular: Chest pain: No  Respiratory: Shortness of breath: No  Gastrointestinal: Nausea/vomiting: No  Musculoskeletal: Back pain: No  Neurological: Weakness: No  Psychological: Memory loss: No  Comments/additional findings:     PE:  There were no vitals taken for this visit.  Constitutional: WDWN, Pleasant and appropriate affect, No acute distress.    CV:  No peripheral swelling noted, RRR  Respiratory: No respiratory distress or difficulties, CTAB  Abdomen:  No abdominal masses or tenderness.  No CVA tenderness. No hernias noted.   GU Male:    ZSW:FUXNATFT  Skin: No jaundice.    Neuro/Psych:  Alert and oriented x 3. Affect appropriate.       CT A/P WO cont 04/19/2016;  Impression:   1. Findings concerning for new osseous metastasis.  Bone scan recommended for further assessment.     2.  Interval postsurgical changes of the distal pancreas and spleen.     3.  No obvious metastasis to the liver, though evaluation is limited on a noncontrast study.  No significant abdominal or pelvic adenopathy.     4.  Small fat containing a focal hernia and patulous left inguinal ring.     5.  Enlarged prostate.    -Bone Scan 05/01/2016  Impression:  Extensive osseous metastatic disease with innumerable lesions of the axial and proximal appendicular skeleton.    Prostate Biopsy 05/14/16:  SURGICAL PATHOLOGY REPORT   ??   CLINICAL HISTORY/INFORMATION:    Previous Biopsy Result: Negative 2013    Treatment: Observation   DRE: Prostate  40 grams and nodule at the left apex   Other Pertinent Clinical History: DNA Kit# AC [id number]   Last PSA Result: Rising ~ 43.03 on 04/15/2016   Number of Containers Submitted: 6        ??   DIAGNOSIS:   A. Right Apex Needle biopsy ??   ?? ?? ?? ?? ?? ?? 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 ?? ?? ?? ?? ?? ??(1 of 1 core(s) positive). Grade Group 5. Tumor involves one end.   B. Right Mid Needle biopsy ??   ?? ?? ?? ?? ?? ?? ??ADENOCARCINOMA, GLEASON SCORE 5 + 5 = 10 involving 40 % of the specimen (1 of 1 core(s) positive).   ?? ?? ?? ?? ?? ?? ??Grade Group 5. Tumor involves one end.   C. Right Base Needle biopsy ??   ?? ?? ?? ?? ?? ?? ??ADENOCARCINOMA, GLEASON SCORE 5 + 4 = 9 involving 3 % of the specimen (1 of 1 core(s) positive).   ?? ?? ?? ?? ?? ?? ??Grade Group 5. Ends not involved by the tumor.   D. Left Apex Needle biopsy ??   ?? ?? ?????? ?? ?? ??Benign prostatic tissue.   E. Left Mid Needle biopsy ??   ?? ?? ?? ?? ?? ?? ??Benign prostatic tissue.   F. Left Base Needle biopsy ??   ?? ?? ?? ?? ?? ?? ??Benign prostatic tissue.     Thresa Ross, [place]      I was present during visit.  I performed the pertinent parts of the exam and they are as documented above.  We  discussed the assessment and plan,  and I concur with plan.    Herbie  Mico Spark MD   Urologic Oncologist  [facility] Hermel[facility] MD Professorship  Professor of Urology  [facility]  Office [phone] Ext [phone]  YP:[id number] Candyce Churn, MD

## 2016-09-25 ENCOUNTER — Encounter

## 2016-09-25 ENCOUNTER — Encounter: Attending: Physician Assistant | Primary: Family Medicine

## 2016-10-01 ENCOUNTER — Institutional Professional Consult (permissible substitution): Admit: 2016-10-01 | Discharge: 2016-10-01 | Payer: MEDICARE | Primary: Family Medicine

## 2016-10-01 DIAGNOSIS — C61 Malignant neoplasm of prostate: Secondary | ICD-10-CM

## 2016-10-01 MED ORDER — DENOSUMAB 120 MG/1.7 ML (70 MG/ML) SUB-Q
120 mg/1.7 mL (70 mg/mL) | Freq: Once | SUBCUTANEOUS | 0 refills | Status: AC
Start: 2016-10-01 — End: 2016-10-01

## 2016-10-01 MED ORDER — DEGARELIX 80 MG SUB-Q SOLN
80 mg | Freq: Once | SUBCUTANEOUS | 0 refills | Status: AC
Start: 2016-10-01 — End: 2016-10-01

## 2016-10-01 NOTE — Progress Notes (Signed)
John Higgins is a 75 y.o. male who is here today per the order of Dr. Given to receive degarelix and xgeva    Patient's identity has been verified.   Dr. Claiborne Billings was available in the clinic as incident to provider.     Patient has not had any dental procedures since his last injection.  Patient denies any upcoming dental procedures.  Patient  denies jaw pain.  Dental Clearance letter on file is up to date.   Date of last Dental Clearance:  Dentures    Patient has been taking supplements of at least 500 mg of calcium and 400 international units of Vitamin D daily.  This is not his first ADT injection.  This is not his first DENOSUMAB injection.  He has had the necessary labs drawn.  Drawn 08/06/16.     Degarelix is administered to abdomen ( left) SQ without difficulty.     Patient tolerated the injection well.    Patient has been scheduled for the next injection which will be due in approximately 1 months    [name] is administered to abdomen ( right) SQ without difficulty.     Patient tolerated the injection well.    Patient has been scheduled for the next injection which will be due in approximately 1 months  Patient has a follow-up appointment with provider on December 18, 2016      Encounter Diagnoses   Name Primary?   ??? Prostate cancer metastatic to multiple sites [facility]) Yes   ??? Bone metastases ([place])        Orders Placed This Encounter   ??? THER/PROPH/DIAG INJECTION, SUBCUT/IM   ??? CHEMOTHER HORMON ANTINEOPL SUB-Q/IM   ??? PR DEGARELIX INJECTION     Order Specific Question:   Charge Quantity?     Answer:   16     Order Specific Question:   Dose     Answer:   80mg      Order Specific Question:   Site     Answer:   LEFT LOWER QUAD ABDOMEN     Order Specific Question:   Expiration Date     Answer:   12/05/2018     Order Specific Question:   Lot#     Answer:   [id number]     Order Specific Question:   Manufacturer     Answer:   Ferring     Order Specific Question:   Perfomed by/Witnessed by:     Answer:   AA      Order Specific Question:   NDC#     [id number]   ??? PR DENOSUMAB INJECTION     Order Specific Question:   Charge Quantity?     Answer:   120     Order Specific Question:   Dose     Answer:   120mg      Order Specific Question:   Site     Answer:   RIGHT LOWER QUAD ABDOMEN     Order Specific Question:   Expiration Date     Answer:   09/04/2018     Order Specific Question:   Lot#     Answer:   [id number]     Order Specific Question:   Manufacturer     Answer:   [employer]     Order Specific Question:   Perfomed by/Witnessed by:     Answer:   aA     Order Specific Question:   NDC#     Answer:  [id number]?? degarelix (FIRMAGON) 80 mg solr injection     Sig: 80 mg by SubCUTAneous route once for 1 dose.     Dispense:  1 Each     Refill:  0   ??? denosumab (XGEVA) soln injection     Sig: 1.7 mL by SubCUTAneous route once for 1 dose.     Dispense:  1.7 mL     Refill:  0       Patient did not supply own medication.      Reviewed by Quinn Axe, MD

## 2016-10-31 ENCOUNTER — Institutional Professional Consult (permissible substitution): Admit: 2016-10-31 | Discharge: 2016-10-31 | Payer: MEDICARE | Primary: Family Medicine

## 2016-10-31 DIAGNOSIS — C61 Malignant neoplasm of prostate: Secondary | ICD-10-CM

## 2016-10-31 MED ORDER — DEGARELIX 80 MG SUB-Q SOLN
80 mg | Freq: Once | SUBCUTANEOUS | 0 refills | Status: AC
Start: 2016-10-31 — End: 2016-10-31

## 2016-10-31 MED ORDER — DENOSUMAB 120 MG/1.7 ML (70 MG/ML) SUB-Q
120 mg/1.7 mL (70 mg/mL) | Freq: Once | SUBCUTANEOUS | 0 refills | Status: AC
Start: 2016-10-31 — End: 2016-10-31

## 2016-10-31 NOTE — Progress Notes (Signed)
ARTHA VONCANNON is a 75 y.o. male who is here today per the order of Dr. Given to receive Degarelix and [name].   Patient's identity has been verified. PA Goshel was available in the clinic as incident to provider.     Patient has not had any dental procedures since his last injection.  Patient denies any upcoming dental procedures.  Patient  denies jaw pain.  Dental Clearance letter on file is up to date.   Date of last Dental Clearance:  December 19, 2015    Patient has been taking supplements of at least 500 mg of calcium and 400 international units of Vitamin D daily.  This is not his first ADT injection.  This is not his first DENOSUMAB injection.  He has had the necessary labs drawn.       [name] is administered to abdomen ( left) SQ without difficulty.     Patient tolerated the injection well.    Patient has been scheduled for the next injection which will be due in approximately 1 months    Degarelix is administered to abdomen ( right) SQ without difficulty.     Patient tolerated the injection well.    Patient has been scheduled for the next injection which will be due in approximately 1 months  Patient has a follow-up appointment with provider on December 18, 2016      Encounter Diagnosis   Name Primary?   ??? Prostate cancer metastatic to multiple sites [facility]) Yes       Orders Placed This Encounter   ??? THER/PROPH/DIAG INJECTION, SUBCUT/IM   ??? PR DENOSUMAB INJECTION     Order Specific Question:   Charge Quantity?     Answer:   120     Order Specific Question:   Dose     Answer:   120 mg     Order Specific Question:   Site     Answer:   LEFT LOWER QUAD ABDOMEN     Order Specific Question:   Expiration Date     Answer:   09/04/2018     Order Specific Question:   Lot#     Answer:   [id number]     Order Specific Question:   Manufacturer     Answer:   [employer]     Order Specific Question:   Perfomed by/Witnessed by:     Answer:   AW     Order Specific Question:   NDC#     [id number]    ??? CHEMOTHER HORMON ANTINEOPL SUB-Q/IM   ??? PR DEGARELIX INJECTION     Order Specific Question:   Charge Quantity?     Answer:   80     Order Specific Question:   Dose     Answer:   80 mg     Order Specific Question:   Site     Answer:   RIGHT LOWER QUAD ABDOMEN     Order Specific Question:   Expiration Date     Answer:   02/03/2019     Order Specific Question:   Lot#     Answer:   [id number]     Order Specific Question:   Manufacturer     Answer:   Arlana Lindau     Order Specific Question:   Perfomed by/Witnessed by:     Answer:   aw     Order Specific Question:   NDC#     Answer:   [id number]   ??? denosumab (  XGEVA) soln injection     Sig: 1.7 mL by SubCUTAneous route once for 1 dose.     Dispense:  1.7 mL     Refill:  0   ??? degarelix (FIRMAGON) 80 mg solr injection     Sig: 80 mg by SubCUTAneous route once for 1 dose.     Dispense:  1 Each     Refill:  0       Patient did not supply own medication.      [name]     I was present in the office and agree with the assessment and plan.   Rowe Clack

## 2016-11-13 NOTE — Progress Notes (Signed)
Patient called on 11/05/16 complaining that he had received his Degarelix injection on 10/31/16 and was experiencing a lot of discomfort at the injection site along with redness.  Patient said he was concerned as it had been 4 days since he received the injection and he had never experienced this type of reaction in the past.   Patient stated that he had someone new give the injection.  I instructed the patient to continue to monitor the site and if it gets worse to call me back.  I did offer the patient the opportunity to come in for someone to look at it, however, he said he didn't want to come in.    Briscoe Burns, MA, [facility]  Research Coordinator

## 2016-11-14 ENCOUNTER — Encounter

## 2016-11-14 NOTE — Progress Notes (Signed)
PT is scheduled for CT AP and Bone Scan on Thursday, December 12, 2016 to arrive at [place] at 12:30pm. PT will pick up EZCAT at my desk on Jan 25. PT is aware.

## 2016-11-28 ENCOUNTER — Institutional Professional Consult (permissible substitution): Admit: 2016-11-28 | Discharge: 2016-11-28 | Payer: MEDICARE | Primary: Family Medicine

## 2016-11-28 DIAGNOSIS — C61 Malignant neoplasm of prostate: Secondary | ICD-10-CM

## 2016-11-28 MED ORDER — DEGARELIX 80 MG SUB-Q SOLN
80 mg | Freq: Once | SUBCUTANEOUS | 0 refills | Status: AC
Start: 2016-11-28 — End: 2016-11-28

## 2016-11-28 MED ORDER — DENOSUMAB 120 MG/1.7 ML (70 MG/ML) SUB-Q
120 mg/1.7 mL (70 mg/mL) | Freq: Once | SUBCUTANEOUS | 0 refills | Status: AC
Start: 2016-11-28 — End: 2016-11-28

## 2016-11-28 NOTE — Progress Notes (Signed)
John Higgins is a 76 y.o. male who is here today per the order of Dr. Given to receive [name] and Degarelix.   Patient's identity has been verified.   Dr. Jimmye Norman was available in the clinic as incident to provider.     Patient has not had any dental procedures since his last injection.  Patient denies any upcoming dental procedures.  Patient  denies jaw pain.  Patient wears dentures    Pt has been taking supplements of at least 500 mg of calcium and 400 international units of Vitamin D daily.  This is not his first ADT injection.  This is not his first DENOSUMAB injection.  He has had the necessary labs drawn.       Degarelix is administered to abdomen ( left) SQ without difficulty.     Patient tolerated the injection well.    Patient has been scheduled for the next injection which will be due in approximately 1 months    [name] is administered to abdomen ( right) SQ without difficulty.     Patient tolerated the injection well.    Patient has been scheduled for the next injection which will be due in approximately 1 months  Patient has a follow-up appointment with provider on December 18, 2016      Encounter Diagnoses   Name Primary?   ??? Prostate cancer metastatic to multiple sites [facility]) Yes   ??? Bone metastases ([place])        Orders Placed This Encounter   ??? THER/PROPH/DIAG INJECTION, SUBCUT/IM   ??? CHEMOTHER HORMON ANTINEOPL SUB-Q/IM   ??? PR DEGARELIX INJECTION     Order Specific Question:   Charge Quantity?     Answer:   33     Order Specific Question:   Dose     Answer:   80 mg     Order Specific Question:   Site     Answer:   LEFT LOWER QUAD ABDOMEN     Order Specific Question:   Expiration Date     Answer:   02/03/2019     Order Specific Question:   Lot#     Answer:   [id number]     Order Specific Question:   Manufacturer     Answer:   Arlana Lindau     Order Specific Question:   Perfomed by/Witnessed by:     Answer:   aw     Order Specific Question:   NDC#     [id number]   ??? PR DENOSUMAB INJECTION      Order Specific Question:   Charge Quantity?     Answer:   120     Order Specific Question:   Dose     Answer:   120 mg     Order Specific Question:   Site     Answer:   RIGHT LOWER QUAD ABDOMEN     Order Specific Question:   Expiration Date     Answer:   10/04/2018     Order Specific Question:   Lot#     Answer:   [id number]     Order Specific Question:   Manufacturer     Answer:   [employer]     Order Specific Question:   Perfomed by/Witnessed by:     Answer:   aw     Order Specific Question:   NDC#     Answer:   [id number]   ??? degarelix (FIRMAGON) 80 mg solr injection  Sig: 80 mg by SubCUTAneous route once for 1 dose.     Dispense:  1 Each     Refill:  0   ??? denosumab (XGEVA) soln injection     Sig: 1.7 mL by SubCUTAneous route once for 1 dose.     Dispense:  1.7 mL     Refill:  0       Patient did not supply own medication.      [name]     Reviewed by Renford Dills, MD

## 2016-12-17 ENCOUNTER — Encounter

## 2016-12-18 ENCOUNTER — Ambulatory Visit: Admit: 2016-12-18 | Discharge: 2016-12-18 | Payer: MEDICARE | Attending: Physician Assistant | Primary: Family Medicine

## 2016-12-18 DIAGNOSIS — C61 Malignant neoplasm of prostate: Secondary | ICD-10-CM

## 2016-12-18 LAB — PSA, DIAGNOSTIC (PROSTATE SPECIFIC AG): Prostate Specific Ag: 1.14

## 2016-12-18 NOTE — Progress Notes (Addendum)
John Higgins  03-09-1941      Impression:  Encounter Diagnoses     ICD-10-CM ICD-9-CM   1. Prostate cancer metastatic to multiple sites ([place]) C61 185     199.0   2. 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:   -Bone scan and CT scan on 04/2016 showed extensive osseous metastatic disease   - Initiated on Degerelix 05/14/16   -Started [name] 06/11/16 (pt edentulous, so no dental clearance needed)   -Enrolled in [facility] trial 06/21/16   -PSA 1.31 as of 09/24/2016   -Bone scan and CT performed 12/12/16 - bone scan with improvement in bone mets    Significant CAD history - CABG 2005, multiple coronary stents most recently 05/2014, managed by Dr Jerline Pain    Plan:  Reviewed most recent PSA with patient-1.31ng/mL  Reviewed most recent imaging - bone scan shows improvement in bone mets  Continue Degarelix and XGeva, injections   Continue Vit D and Ca supplements (Prosteon)  Discussed increasing exercise and watching diet- recommend low fat diet  F/u as arranged by [facility]CHES trial    Chief Complaint   Patient presents with   ??? Prostate Cancer       HPI: Pt returns to the office for participation in [facility]ES trial.  Pt underwent prostate biopsy for PSA of 43. Biopsy positive for gleason 10 disease.  Bone scan showed extensive osseous metastatic disease with innumerable lesions of the axial and proximal appendicular skeleton. Pt initiated on Degarelix on 05/14/16. Initiated on XGeva 06/11/16. Most recent injections given 11/28/16. Pt previously reported continued right thigh pain, present for many weeks, pain resolved after treatment. He is no longer using a cane.    Most recent bone scan on 12/12/16 showed improvement in bone mets.  Most recent PSA of 1.31 on 09/20/16.    Patient reports he is tolerating both ADT injections and Xgeva well. He notes no hot flashes.  No voiding complaints. Denies hematuria, dysuria, incontinence, or  incomplete bladderr emptying. Currently asymptomatic for infections. Good appetite and stable weight. Denies bone, back, or abdominal pain.    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      Past Medical History:   Diagnosis Date   ??? CAD (coronary artery disease) 2005, 2015    CABG 2005, multiple stents, most recently in 05/2014   ??? Diabetes ([place])    ??? DM (diabetes mellitus screen)    ??? GERD (gastroesophageal reflux disease)    ??? HTN (hypertension)    ??? Liver disorder    ??? Prostate cancer ([place])     cT2bN0M1b Prostate Cancer w/ bone mets   ??? Thyroid disease          Past Surgical History:   Procedure Laterality Date   ??? CARDIAC SURG PROCEDURE UNLIST       cardiac bypass 2005 stent placement   ??? HX CATARACT REMOVAL Left 08/16/2015   ??? HX CHOLECYSTECTOMY  1974   ??? 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 HX SPLENECTOMY           Allergies   Allergen Reactions   ??? Naproxen Hives         Current Outpatient Prescriptions:   ???  triamcinolone acetonide (KENALOG) 0.1 % ointment, apply to affected  area three times a day for 5 days, Disp: , Rfl: 0  ???  metoprolol succinate (TOPROL-XL) 50 mg XL tablet, , Disp: , Rfl:   ???  nitroglycerin (NITROSTAT) 0.4 mg SL tablet, , Disp: , Rfl:   ???  isosorbide dinitrate (ISORDIL) 30 mg tablet, Take 30 mg by mouth four (4) times daily., Disp: , Rfl:   ???  gabapentin (NEURONTIN) 300 mg capsule, , Disp: , Rfl:   ???  glimepiride (AMARYL) 1 mg tablet, , Disp: , Rfl:   ???  simvastatin (ZOCOR) 40 mg tablet, , Disp: , Rfl:   ???  tamsulosin (FLOMAX) 0.4 mg capsule, , Disp: , Rfl:   ???  clopidogrel (PLAVIX) 75 mg tablet, , Disp: , Rfl:   ???  METFORMIN HCL (METFORMIN PO), Take  by mouth., Disp: , Rfl:   ???  LEVOTHYROXINE SODIUM (LEVOTHYROXINE PO), Take  by mouth., Disp: , Rfl:   ???  losartan (COZAAR) 50 mg tablet, Take  by mouth daily., Disp: , Rfl:   ???  aspirin 81 mg tablet, Take 81 mg by mouth., Disp: , Rfl:      History reviewed. No pertinent family history.  Social History     Social History   ??? Marital status: SINGLE     Spouse name: N/A   ??? Number of children: N/A   ??? Years of education: N/A     Social History Main Topics   ??? Smoking status: Former Smoker   ??? Smokeless tobacco: Never Used   ??? Alcohol use No   ??? Drug use: No   ??? Sexual activity: Not Asked     Other Topics Concern   ??? None     Social History Narrative         Review of Systems  Constitutional: Fever: No  Skin: Rash: No  HEENT: Hearing difficulty: No  Eyes: Blurred vision: No  Cardiovascular: Chest pain: No  Respiratory: Shortness of breath: No  Gastrointestinal: Nausea/vomiting: No  Musculoskeletal: Back pain: No  Neurological: Weakness: No  Psychological: Memory loss: No  Comments/additional findings:     PE:  Visit Vitals   ??? BP 120/60   ??? Pulse 60   ??? Temp 98.3 ??F (36.8 ??C)   ??? Resp 12   ??? Ht 5' 9" (1.753 m)   ??? Wt 171 lb (77.6 kg)   ??? BMI 25.25 kg/m2     Constitutional: WDWN, Pleasant and appropriate affect, No acute distress.    CV:  No peripheral swelling noted, RRR  Respiratory: No respiratory distress or difficulties, CTAB  Abdomen:  No abdominal masses or tenderness.  No CVA tenderness. No hernias noted.   GU Male:    [id number]  Skin: No jaundice.    Neuro/Psych:  Alert and oriented x 3. Affect appropriate.       CT A/P WO cont 04/19/2016;  Impression:   1. Findings concerning for new osseous metastasis.  Bone scan recommended for further assessment.     2.  Interval postsurgical changes of the distal pancreas and spleen.     3.  No obvious metastasis to the liver, though evaluation is limited on a noncontrast study.  No significant abdominal or pelvic adenopathy.     4.  Small fat containing a focal hernia and patulous left inguinal ring.     5.  Enlarged prostate.    -Bone Scan 05/01/2016  Impression:  Extensive osseous metastatic disease with innumerable lesions of the axial and proximal appendicular skeleton.  Prostate Biopsy 05/14/16:   SURGICAL PATHOLOGY REPORT   ??   CLINICAL HISTORY/INFORMATION:    Previous Biopsy Result: Negative 2013   Treatment: Observation   DRE: Prostate 40 grams and nodule at the left apex   Other Pertinent Clinical History: DNA Kit# AC [id number]   Last PSA Result: Rising ~ 43.03 on 04/15/2016   Number of Containers Submitted: 6        ??   DIAGNOSIS:   A. Right Apex Needle biopsy ??   ?? ?? ?? ?? ?? ?? 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 ?? ?? ?? ?? ?? ??(1 of 1 core(s) positive). Grade Group 5. Tumor involves one end.   B. Right Mid Needle biopsy ??   ?? ?? ?? ?? ?? ?? ??ADENOCARCINOMA, GLEASON SCORE 5 + 5 = 10 involving 40 % of the specimen (1 of 1 core(s) positive).   ?? ?? ?? ?? ?? ?? ??Grade Group 5. Tumor involves one end.   C. Right Base Needle biopsy ??   ?? ?? ?? ?? ?? ?? ??ADENOCARCINOMA, GLEASON SCORE 5 + 4 = 9 involving 3 % of the specimen (1 of 1 core(s) positive).   ?? ?? ?? ?? ?? ?? ??Grade Group 5. Ends not involved by the tumor.   D. Left Apex Needle biopsy ??   ?? ?? ?????? ?? ?? ??Benign prostatic tissue.   E. Left Mid Needle biopsy ??   ?? ?? ?? ?? ?? ?? ??Benign prostatic tissue.   F. Left Base Needle biopsy ??   ?? ?? ?? ?? ?? ?? ??Benign prostatic tissue.     Thresa Ross, PA      [id number] Candyce Churn, MD      The history, physical and relevant labs/imaging were reviewed by Arletha Grippe, MD on 12/20/2016 and agree with the physician assistant's assessment and plan.    Arletha Grippe MD  [facility], [place]

## 2016-12-18 NOTE — Progress Notes (Signed)
PROGRESS NOTE: [facility] Week 25 VISIT  Sponsor:  [employer]  Protocol: [id number]  Subject: [id number]  Date of Visit: 18 December 2016      VISIT TYPE    Subject [phone] presents today for visit Week 25 in the [facility] clinical trial.  All questionnaires have been completed by the patient prior to any procedures being performed per protocol.      Vitals were performed:  Blood pressure 120/60, pulse 60, temperature 98.3 ??F (36.8 ??C), resp. rate 12, height 5\' 9"  (1.753 m), weight 171 lb (77.6 kg).    A physical exam and ECOG assessment for this visit were performed by Wynn Banker, PA-C with no abnormalities observed on the physical exam and and ECOG of 0 assessed.  CT and Bone Scan were reviewed with the patient by Wynn Banker, PA-C and showed no new evidence of metastatic disease.    IP Accountability:  The patient returned all used/unused IP and accountability was taken.    INVESTIGATIONAL PRODUCT:  IP was dispensed to the patient and dosing instructions were reviewed with the subject.  He expressed understanding.      ADVERSE EVENTS: Patient indicates previous AEs are ongoing/resolved.  No new AEs were reported.    MEDICAL CHANGES:  Patient continues to take all previously disclosed medications and has not added any new medications.    The subject states he remains eligible and committed to actively continue in this clinical trial.  His next appointment is Mar 14, 2017.  Until then, he confirms he will contact us if any problems or questions arise.    Briscoe Burns, MA, CRC  Research Coordinator    FOR FULL DOCUMENTATION OF THIS CLINICAL TRIAL VISIT, PLEASE SEE THE SOURCE DOCUMENT, AS THIS IS THE FIRST PLACE VISIT INFORMATION FROM CLINICAL TRIAL VISITS IS RECORDED.

## 2016-12-26 ENCOUNTER — Institutional Professional Consult (permissible substitution): Admit: 2016-12-26 | Discharge: 2016-12-26 | Payer: MEDICARE | Primary: Family Medicine

## 2016-12-26 DIAGNOSIS — C61 Malignant neoplasm of prostate: Secondary | ICD-10-CM

## 2016-12-26 MED ORDER — DEGARELIX 80 MG SUB-Q SOLN
80 mg | Freq: Once | SUBCUTANEOUS | 0 refills | Status: AC
Start: 2016-12-26 — End: 2016-12-26

## 2016-12-26 MED ORDER — DENOSUMAB 120 MG/1.7 ML (70 MG/ML) SUB-Q
120 mg/1.7 mL (70 mg/mL) | Freq: Once | SUBCUTANEOUS | 0 refills | Status: AC
Start: 2016-12-26 — End: 2016-12-26

## 2016-12-26 NOTE — Progress Notes (Signed)
John Higgins is a 76 y.o. male who is here today per the order of Dr. Given to receive [name] and Degarelix.   Patient's identity has been verified.   Dr. Quentin Ore was available in the clinic as incident to provider.     Patient has not had any dental procedures since his last injection.  Patient denies any upcoming dental procedures.  Patient  denies jaw pain.  Dental Clearance letter on file is up to date.   Date of last Dental Clearance: Dentures    Patient has been taking supplements of at least 500 mg of calcium and 400 international units of Vitamin D daily.  This is not his first ADT injection.  This is not his first DENOSUMAB injection.  He has not had the necessary labs drawn.       [name] is administered to abdomen ( left) SQ without difficulty.     Patient tolerated the injection well.    Patient has been scheduled for the next injection which will be due in approximately 1 months    Degarelix is administered to abdomen ( right) SQ without difficulty.     Patient tolerated the injection well.    Patient has been scheduled for the next injection which will be due in approximately 1 months  Patient has a follow-up appointment with provider on January 23, 2017      Encounter Diagnoses   Name Primary?   ??? Prostate cancer metastatic to multiple sites [facility]) Yes   ??? Bone metastases ([place])        Orders Placed This Encounter   ??? THER/PROPH/DIAG INJECTION, SUBCUT/IM   ??? CHEMOTHER HORMON ANTINEOPL SUB-Q/IM   ??? PR DEGARELIX INJECTION     Order Specific Question:   Charge Quantity?     Answer:   53     Order Specific Question:   Dose     Answer:   80 mg     Order Specific Question:   Site     Answer:   RIGHT LOWER QUAD ABDOMEN     Order Specific Question:   Expiration Date     Answer:   03/04/2019     Order Specific Question:   Lot#     Answer:   [id number]     Order Specific Question:   Manufacturer     Answer:   ferring     Order Specific Question:   Perfomed by/Witnessed by:     Answer:   Scarlette Slice      Order Specific Question:   NDC#     Answer:   [phone]   ??? PR DENOSUMAB INJECTION     Order Specific Question:   Charge Quantity?     Answer:   120     Order Specific Question:   Dose     Answer:   120 mg     Order Specific Question:   Site     Answer:   LEFT LOWER QUAD ABDOMEN     Order Specific Question:   Expiration Date     Answer:   01/01/2019     Order Specific Question:   Lot#     Answer:   [id number]     Order Specific Question:   Manufacturer     Answer:   amgen     Order Specific Question:   Perfomed by/Witnessed by:     Answer:   Scarlette Slice     Order Specific Question:   NDC#  Answer:   [phone]   ??? degarelix (FIRMAGON) 80 mg solr injection     Sig: 80 mg by SubCUTAneous route once for 1 dose.     Dispense:  1 Each     Refill:  0   ??? denosumab (XGEVA) soln injection     Sig: 1.7 mL by SubCUTAneous route once for 1 dose.     Dispense:  1.7 mL     Refill:  0       Patient did not supply own medication.      Scarlette Slice     Reviewed by Young Berry, MD on 12/26/2016 and agree with the above.

## 2017-01-23 ENCOUNTER — Institutional Professional Consult (permissible substitution): Admit: 2017-01-23 | Discharge: 2017-01-23 | Payer: MEDICARE | Primary: Family Medicine

## 2017-01-23 DIAGNOSIS — C61 Malignant neoplasm of prostate: Secondary | ICD-10-CM

## 2017-01-23 MED ORDER — DEGARELIX 80 MG SUB-Q SOLN
80 mg | Freq: Once | SUBCUTANEOUS | 0 refills | Status: AC
Start: 2017-01-23 — End: 2017-01-23

## 2017-01-23 MED ORDER — DENOSUMAB 120 MG/1.7 ML (70 MG/ML) SUB-Q
120 mg/1.7 mL (70 mg/mL) | Freq: Once | SUBCUTANEOUS | 0 refills | Status: AC
Start: 2017-01-23 — End: 2017-01-23

## 2017-01-23 NOTE — Progress Notes (Signed)
John Higgins is a 76 y.o. male who is here today per the order of Dr. Given  to receive Degarelix and xgeva..   Patient's identity has been verified.   Dr. Jimmye Norman  was available in the clinic as incident to provider.     Patient has not had any dental procedures since his last injection.  Patient denies any upcoming dental procedures.  Patient  denies jaw pain.  Dental Clearance letter on file is up to date.      Patient has been taking supplements of at least 500 mg of calcium and 400 international units of Vitamin D daily.  This is not his first ADT injection.  This is not his first DENOSUMAB injection.  He has had the necessary labs drawn.       Degarelix  is administered to abdomen ( left) SQ without difficulty.     Patient tolerated the injection well.    Patient has been scheduled for the next injection which will be due in approximately 1 months    [name]  is administered to abdomen ( right) SQ without difficulty.     Patient tolerated the injection well.    Patient has been scheduled for the next injection which will be due in approximately 1 months  Patient has a follow-up appointment with provider on Mar 14, 2017      Encounter Diagnosis   Name Primary?   ??? Prostate cancer metastatic to multiple sites [facility]) Yes       Orders Placed This Encounter   ??? THER/PROPH/DIAG INJECTION, SUBCUT/IM   ??? PR DENOSUMAB INJECTION     Order Specific Question:   Charge Quantity?     Answer:   120     Order Specific Question:   Dose     Answer:   120 mg     Order Specific Question:   Site     Answer:   RIGHT LOWER QUAD ABDOMEN     Order Specific Question:   Expiration Date     Answer:   09/25/2018     Order Specific Question:   Lot#     Answer:   [id number]     Order Specific Question:   Manufacturer     Answer:   [employer]gen     Order Specific Question:   Perfomed by/Witnessed by:     Answer:   fh     Order Specific Question:   NDC#     Answer:   41660-630-16   ??? CHEMOTHER HORMON ANTINEOPL SUB-Q/IM   ??? PR DEGARELIX INJECTION      Order Specific Question:   Charge Quantity?     Answer:   29     Order Specific Question:   Dose     Answer:   80 mg     Order Specific Question:   Site     Answer:   LEFT LOWER QUAD ABDOMEN     Order Specific Question:   Expiration Date     Answer:   04/26/2019     Order Specific Question:   Lot#     Answer:   [id number]     Order Specific Question:   Manufacturer     Answer:   Arlana Lindau     Order Specific Question:   Perfomed by/Witnessed by:     Answer:   fh     Order Specific Question:   NDC#     Answer:   [id number]   ??? denosumab (XGEVA) soln injection  Sig: 1.7 mL by SubCUTAneous route once for 1 dose.     Dispense:  1.7 mL     Refill:  0   ??? degarelix (FIRMAGON) 80 mg solr injection     Sig: 80 mg by SubCUTAneous route once for 1 dose.     Dispense:  1 Each     Refill:  0       Patient did not supply own medication.      Axel Filler     Reviewed by Renford Dills, MD

## 2017-02-13 ENCOUNTER — Encounter

## 2017-02-13 NOTE — Progress Notes (Signed)
PT is scheduled for NM Bone Scan and CT AP on Tuesday, Mar 11, 2017 to arrive at [facility] at 10:00am. PT will pick up contrast at his next injeciton visit on April 19. PT is aware no diabetic pills on May 8,9,10.

## 2017-02-20 ENCOUNTER — Institutional Professional Consult (permissible substitution): Admit: 2017-02-20 | Discharge: 2017-02-20 | Payer: MEDICARE | Primary: Family Medicine

## 2017-02-20 DIAGNOSIS — C61 Malignant neoplasm of prostate: Secondary | ICD-10-CM

## 2017-02-20 MED ORDER — DEGARELIX 80 MG SUB-Q SOLN
80 mg | Freq: Once | SUBCUTANEOUS | 0 refills | Status: AC
Start: 2017-02-20 — End: 2017-02-20

## 2017-02-20 MED ORDER — DENOSUMAB 120 MG/1.7 ML (70 MG/ML) SUB-Q
120 mg/1.7 mL (70 mg/mL) | Freq: Once | SUBCUTANEOUS | 0 refills | Status: AC
Start: 2017-02-20 — End: 2017-02-20

## 2017-02-20 NOTE — Progress Notes (Signed)
John Higgins is a 76 y.o. male who is here today per the order of Dr. Given to receive Degarelix and [name] and to have labs drawn.   Patient's identity has been verified.   Dr. Norberto Sorenson was available in the clinic as incident to provider.     Patient has not had any dental procedures since his last injection.  Patient denies any upcoming dental procedures.  Patient  denies jaw pain.  Dental : patient has dentures    Patient has been taking supplements of at least 500 mg of calcium and 400 international units of Vitamin D daily.  This is not his first ADT injection.  This is not his first DENOSUMAB injection.  He has had the necessary labs drawn.  PSA, TST and Vitamin D obtained via venipuncture without any difficulty.  Patient will be notified with lab results.     [name] is administered to abdomen ( left) SQ without difficulty.     Patient tolerated the injection well.    Patient has been scheduled for the next injection which will be due in approximately 4 weeks    Degarelix is administered to abdomen ( right) SQ without difficulty.     Patient tolerated the injection well.    Patient has been scheduled for the next injection which will be due in approximately 4 weeks  Patient has a follow-up appointment with provider on Mar 17, 2017      Encounter Diagnoses   Name Primary?   ??? Prostate cancer metastatic to multiple sites [facility]) Yes   ??? Bone metastases ([place])    ??? Vitamin D deficiency      Orders Placed This Encounter   ??? THER/PROPH/DIAG INJECTION, SUBCUT/IM   ??? PROSTATE SPECIFIC ANTIGEN, TOTAL (PSA)   ??? TESTOSTERONE   ??? VITAMIN D, 25 HYDROXY   ??? COLLECTION VENOUS BLOOD,VENIPUNCTURE   ??? CHEMOTHER HORMON ANTINEOPL SUB-Q/IM   ??? PR DEGARELIX INJECTION     Order Specific Question:   Charge Quantity?     Answer:   46     Order Specific Question:   Dose     Answer:   80 mg     Order Specific Question:   Site     Answer:   RIGHT LOWER QUAD ABDOMEN     Order Specific Question:   Expiration Date     Answer:   05/04/2019      Order Specific Question:   Lot#     Answer:   [id number]     Order Specific Question:   Manufacturer     Answer:   Ferring     Order Specific Question:   Perfomed by/Witnessed by:     Answer:   Salena Saner Stiff     Order Specific Question:   NDC#     Answer:   [id number]   ??? PR DENOSUMAB INJECTION     Order Specific Question:   Charge Quantity?     Answer:   120     Order Specific Question:   Dose     Answer:   120 mg     Order Specific Question:   Site     Answer:   LEFT LOWER QUAD ABDOMEN     Order Specific Question:   Expiration Date     Answer:   01/01/2019     Order Specific Question:   Lot#     Answer:   [id number]     Order Specific Question:   Manufacturer  Answer:   [employer]gen     Order Specific Question:   Perfomed by/Witnessed by:     Answer:   Salena Saner Stiff     Order Specific Question:   NDC#     Answer:   [id number]   ??? degarelix (FIRMAGON) 80 mg solr injection     Sig: 80 mg by SubCUTAneous route once for 1 dose.     Dispense:  1 Each     Refill:  0   ??? denosumab (XGEVA) soln injection     Sig: 1.7 mL by SubCUTAneous route once for 1 dose.     Dispense:  1.7 mL     Refill:  0         Patient did not supply own medication.      Domenica Reamer Stiff,MA    Reviewed by Drusilla Kanner, MD

## 2017-02-21 LAB — PROSTATE SPECIFIC ANTIGEN, TOTAL (PSA): Prostate Specific Ag: 1.35 ng/mL (ref 0.00–4.00)

## 2017-02-21 LAB — TESTOSTERONE: Testosterone: <3 ng/dL — ABNORMAL LOW (ref 348–1197)

## 2017-02-26 LAB — VITAMIN D, 25 HYDROXY: VITAMIN D, 25-HYDROXY: 20.4 ng/ml — ABNORMAL LOW (ref 30.0–100.0)

## 2017-03-14 ENCOUNTER — Encounter

## 2017-03-14 ENCOUNTER — Ambulatory Visit: Admit: 2017-03-14 | Discharge: 2017-03-14 | Payer: MEDICARE | Attending: Urology | Primary: Family Medicine

## 2017-03-14 DIAGNOSIS — C61 Malignant neoplasm of prostate: Secondary | ICD-10-CM

## 2017-03-14 LAB — OSMOLALITY, UR: Prostate Specific Ag: 1.74

## 2017-03-14 NOTE — Progress Notes (Signed)
PROGRESS NOTE: [facility] Week 41 VISIT  Sponsor:  [employer]  Protocol: [id number]  Subject: [id number]  Date of Visit: [id number]    Consenting  The revised informed consent V3 dated 12/25/2016 of the main consent and V2.0 dated 02FEB2018 of the optional pharmacogenomics consent for a clinical trial for subjects with metastatic prostate cancer was presented. Changes to the consent were discussed with the subject. He was given time to read/review the ICF.  All questions were asked and answers were reviewed by Clinton Sawyer, CCRP with the subject to their satisfaction.  The subject has expressed the desire to continue participation in the clinical trial.  The subject has signed and dated the informed consent with all other parties.  A copy of the signed and dated ICF has been given to the subject for home files.      VISIT TYPE    Subject [phone] presents today for visit Week 37 in the [facility] clinical trial.  All questionnaires have been completed by the patient prior to any procedures being performed per protocol.      Vitals were performed:  Blood pressure 120/72, pulse 66, temperature 98.3 ??F (36.8 ??C), temperature source Oral, resp. rate 12, height 5\' 9"  (1.753 m), weight 174 lb 9.6 oz (79.2 kg).    A physical exam and ECOG assessment for this visit were performed by Wynn Banker, PA-C with no abnormalities observed on the physical exam and and ECOG of 1 assessed.  CT and Bone Scan were reviewed with the patient by Wynn Banker, PA-C and showed no new evidence of metastatic disease. Dr. Gearldine Bienenstock was in to see the patient as well and discussed scan results with the patient.     IP Accountability:  The patient returned all used/unused IP and accountability was taken.     INVESTIGATIONAL PRODUCT:  IP was dispensed to the patient and dosing instructions were reviewed with the subject.  He expressed understanding.      ADVERSE EVENTS: Patient indicates previous AEs are ongoing/resolved.  No new AEs were reported.     MEDICAL CHANGES:  Patient started Insulin 5 units every day on 03/13/2017.    The subject states he remains eligible and committed to actively continue in this clinical trial.  His next appointment is 06/04/2017.  Until then, he confirms he will contact us if any problems or questions arise.    Briscoe Burns, MA, CRC  Research Coordinator    FOR FULL DOCUMENTATION OF THIS CLINICAL TRIAL VISIT, PLEASE SEE THE SOURCE DOCUMENT, AS THIS IS THE FIRST PLACE VISIT INFORMATION FROM CLINICAL TRIAL VISITS IS RECORDED.

## 2017-03-14 NOTE — Progress Notes (Signed)
John Higgins  30-Jun-1941      Impression:  Encounter Diagnoses     ICD-10-CM ICD-9-CM   1. Prostate cancer metastatic to multiple sites ([place]) C61 185     199.0   2. 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:   -Bone scan and CT scan on 04/2016 showed extensive osseous metastatic disease   - Initiated on Degerelix 05/14/16   -Started [name] 06/11/16 (pt edentulous, so no dental clearance needed)   -Enrolled in [facility]CHES trial 06/21/16   -PSA 1.35 as of 02/20/17   -Bone scan and CT performed 12/12/16 - bone scan with improvement in bone mets   -Bone scan 03/11/17:  bone scan with stable number of bone mets, but increased activity in  right superior pubic ramus, upper margin of the left SI joint,  left intertrochanteric region, left scapula    - CT stable    Significant CAD history - CABG 2005, multiple coronary stents most recently 05/2014, managed by Dr Jerline Pain    Plan:  Reviewed most recent PSA with patient-1.35ng/mL  Reviewed most recent imaging - bone scan with stable number of bone mets, but increased activity in  right superior pubic ramus, upper margin of the left SI joint, left intertrochanteric region, left scapula     Continue Degarelix and XGeva, injections   Continue Vit D and Ca supplements (Prosteon)  Discussed increasing exercise and watching diet- recommend low fat diet  F/u as arranged by ARCHES trial    Chief Complaint   Patient presents with   ??? Prostate Cancer       HPI: Pt returns to the office for participation in ARCHES trial.  Pt underwent prostate biopsy for PSA of 43. Biopsy positive for gleason 10 disease.  Bone scan showed extensive osseous metastatic disease with innumerable lesions of the axial and proximal appendicular skeleton. Pt initiated on Degarelix on 05/14/16. Initiated on XGeva 06/11/16. Most recent injections John Higgins 02/20/17. Pt previously reported continued right thigh  pain, present for many weeks, pain resolved after treatment. He is no longer using a cane.    Most recent bone scan on 03/11/17 showed overall stable number of bone mets.  Radiologist noted:  "interval increase in the degree of radiotracer activity within the right superior pubic ramus and also upper margin of the left SI joint compared to the previous study. ??There is also slight increased radiotracer activity within the left intertrochanteric region compared to the previous study. ??There is also increasing radiotracer localization within the left scapula compared to the previous study."      Most recent PSA of 1.35 in 02/2017.    Patient reports he is tolerating both ADT injections and Xgeva well. He notes no hot flashes.  No voiding complaints. Denies hematuria, dysuria, incontinence, or incomplete bladderr emptying. Currently asymptomatic for infections. Good appetite and stable weight. Denies bone, back, or abdominal pain.    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      Past Medical History:   Diagnosis Date   ??? CAD (coronary artery disease) 2005, 2015    CABG 2005, multiple stents, most recently in 05/2014   ??? Diabetes ([place])    ??? DM (diabetes mellitus screen)    ??? GERD (gastroesophageal reflux disease)    ??? HTN (hypertension)    ??? Liver disorder    ???  Prostate cancer ([place])     cT2bN0M1b Prostate Cancer w/ bone mets   ??? Thyroid disease          Past Surgical History:   Procedure Laterality Date   ??? CARDIAC SURG PROCEDURE UNLIST       cardiac bypass 2005 stent placement   ??? HX CATARACT REMOVAL Left 08/16/2015   ??? HX CHOLECYSTECTOMY  1974   ??? 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 HX SPLENECTOMY           Allergies   Allergen Reactions   ??? Naproxen Hives         Current Outpatient Prescriptions:   ???  triamcinolone acetonide (KENALOG) 0.1 % ointment, apply to affected area three times a day for 5 days, Disp: , Rfl: 0   ???  metoprolol succinate (TOPROL-XL) 50 mg XL tablet, , Disp: , Rfl:   ???  nitroglycerin (NITROSTAT) 0.4 mg SL tablet, , Disp: , Rfl:   ???  isosorbide dinitrate (ISORDIL) 30 mg tablet, Take 30 mg by mouth four (4) times daily., Disp: , Rfl:   ???  gabapentin (NEURONTIN) 300 mg capsule, , Disp: , Rfl:   ???  glimepiride (AMARYL) 1 mg tablet, , Disp: , Rfl:   ???  simvastatin (ZOCOR) 40 mg tablet, , Disp: , Rfl:   ???  tamsulosin (FLOMAX) 0.4 mg capsule, , Disp: , Rfl:   ???  clopidogrel (PLAVIX) 75 mg tablet, , Disp: , Rfl:   ???  METFORMIN HCL (METFORMIN PO), Take  by mouth., Disp: , Rfl:   ???  LEVOTHYROXINE SODIUM (LEVOTHYROXINE PO), Take  by mouth., Disp: , Rfl:   ???  losartan (COZAAR) 50 mg tablet, Take  by mouth daily., Disp: , Rfl:   ???  aspirin 81 mg tablet, Take 81 mg by mouth., Disp: , Rfl:     History reviewed. No pertinent family history.  Social History     Social History   ??? Marital status: SINGLE     Spouse name: N/A   ??? Number of children: N/A   ??? Years of education: N/A     Social History Main Topics   ??? Smoking status: Former Smoker   ??? Smokeless tobacco: Never Used   ??? Alcohol use No   ??? Drug use: No   ??? Sexual activity: Not Asked     Other Topics Concern   ??? None     Social History Narrative         Review of Systems  Constitutional: Fever: No  Skin: Rash: No  HEENT: Hearing difficulty: No  Eyes: Blurred vision: No  Cardiovascular: Chest pain: No  Respiratory: Shortness of breath: No  Gastrointestinal: Nausea/vomiting: No  Musculoskeletal: Back pain: No  Neurological: Weakness: No  Psychological: Memory loss: No  Comments/additional findings:     PE:  There were no vitals taken for this visit.  Constitutional: WDWN, Pleasant and appropriate affect, No acute distress.    CV:  No peripheral swelling noted, RRR  Respiratory: No respiratory distress or difficulties, CTAB  Abdomen:  No abdominal masses or tenderness.  No CVA tenderness. No hernias noted.   GU Male:    OYD:XAJOINOM  Skin: No jaundice.     Neuro/Psych:  Alert and oriented x 3. Affect appropriate.       CT A/P WO cont 04/19/2016;  Impression:   1. Findings concerning for new osseous metastasis.  Bone scan recommended for further  assessment.     2.  Interval postsurgical changes of the distal pancreas and spleen.     3.  No obvious metastasis to the liver, though evaluation is limited on a noncontrast study.  No significant abdominal or pelvic adenopathy.     4.  Small fat containing a focal hernia and patulous left inguinal ring.     5.  Enlarged prostate.    -Bone Scan 05/01/2016  Impression:  Extensive osseous metastatic disease with innumerable lesions of the axial and proximal appendicular skeleton.    Prostate Biopsy 05/14/16:  SURGICAL PATHOLOGY REPORT   ??   CLINICAL HISTORY/INFORMATION:    Previous Biopsy Result: Negative 2013   Treatment: Observation   DRE: Prostate 40 grams and nodule at the left apex   Other Pertinent Clinical History: DNA Kit# AC [id number]   Last PSA Result: Rising ~ 43.03 on 04/15/2016   Number of Containers Submitted: 6        ??   DIAGNOSIS:   A. Right Apex Needle biopsy ??   ?? ?? ?? ?? ?? ?? 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 ?? ?? ?? ?? ?? ??(1 of 1 core(s) positive). Grade Group 5. Tumor involves one end.   B. Right Mid Needle biopsy ??   ?? ?? ?? ?? ?? ?? ??ADENOCARCINOMA, GLEASON SCORE 5 + 5 = 10 involving 40 % of the specimen (1 of 1 core(s) positive).   ?? ?? ?? ?? ?? ?? ??Grade Group 5. Tumor involves one end.   C. Right Base Needle biopsy ??   ?? ?? ?? ?? ?? ?? ??ADENOCARCINOMA, GLEASON SCORE 5 + 4 = 9 involving 3 % of the specimen (1 of 1 core(s) positive).   ?? ?? ?? ?? ?? ?? ??Grade Group 5. Ends not involved by the tumor.   D. Left Apex Needle biopsy ??   ?? ?? ?????? ?? ?? ??Benign prostatic tissue.   E. Left Mid Needle biopsy ??   ?? ?? ?? ?? ?? ?? ??Benign prostatic tissue.   F. Left Base Needle biopsy ??   ?? ?? ?? ?? ?? ?? ??Benign prostatic tissue.     Janit Pagan PA-C       I was present during visit.  I performed the pertinent parts of the exam and they are as documented above.  We  discussed the assessment and plan,  and I concur with plan.    Herbie [place] Hyun Reali MD   Urologic Oncologist  [facility] MD Professorship  Professor of Urology  [facility]  Office [phone] Ext [phone]    YI:[id number] Candyce Churn, MD

## 2017-03-21 ENCOUNTER — Institutional Professional Consult (permissible substitution): Admit: 2017-03-21 | Discharge: 2017-03-21 | Payer: MEDICARE | Primary: Family Medicine

## 2017-03-21 DIAGNOSIS — C61 Malignant neoplasm of prostate: Secondary | ICD-10-CM

## 2017-03-21 MED ORDER — DENOSUMAB 120 MG/1.7 ML (70 MG/ML) SUB-Q
120 mg/1.7 mL (70 mg/mL) | Freq: Once | SUBCUTANEOUS | 0 refills | Status: AC
Start: 2017-03-21 — End: 2017-03-21

## 2017-03-21 MED ORDER — DEGARELIX 80 MG SUB-Q SOLN
80 mg | Freq: Once | SUBCUTANEOUS | 0 refills | Status: AC
Start: 2017-03-21 — End: 2017-03-21

## 2017-03-21 NOTE — Progress Notes (Signed)
John Higgins is a 76 y.o. male who is here today per the order of Dr. Given  to receive Dgearelix and [name].  Patient's identity has been verified.   Rowe Clack PA  was available in the clinic as incident to provider.     Patient has not had any dental procedures since his last injection.  Patient denies any upcoming dental procedures.  Patient  denies jaw pain.  Patient wears Dentures.     Patient has been taking supplements of at least 500 mg of calcium and 400 international units of Vitamin D daily.  This is not his first ADT injection.  This is not his first DENOSUMAB injection.  He has had the necessary labs drawn.      Degarelix  is administered to abdomen ( left) SQ without difficulty.     Patient tolerated the injection well.    Patient has been scheduled for the next injection which will be due in approximately 1 months    [name]  is administered to abdomen ( right) SQ without difficulty.     Patient tolerated the injection well.    Patient has been scheduled for the next injection which will be due in approximately 1 months  Patient has a follow-up appointment with provider on June 04, 2017      Encounter Diagnosis   Name Primary?   ??? Prostate cancer metastatic to multiple sites [facility]) Yes       Orders Placed This Encounter   ??? THER/PROPH/DIAG INJECTION, SUBCUT/IM   ??? CHEMOTHER HORMON ANTINEOPL SUB-Q/IM   ??? PR DEGARELIX INJECTION     Order Specific Question:   Charge Quantity?     Answer:   81     Order Specific Question:   Dose     Answer:   80 mg     Order Specific Question:   Site     Answer:   LEFT LOWER QUAD ABDOMEN     Order Specific Question:   Expiration Date     Answer:   05/22/2019     Order Specific Question:   Lot#     Answer:   [id number]     Order Specific Question:   Manufacturer     Answer:   Arlana Lindau     Order Specific Question:   Perfomed by/Witnessed by:     Answer:   fh     Order Specific Question:   NDC#     Answer:   [address]-3   ??? PR DENOSUMAB INJECTION      Order Specific Question:   Charge Quantity?     Answer:   120     Order Specific Question:   Dose     Answer:   120 mg     Order Specific Question:   Site     Answer:   RIGHT LOWER QUAD ABDOMEN     Order Specific Question:   Expiration Date     Answer:   01/20/2019     Order Specific Question:   Lot#     Answer:   [id number]     Order Specific Question:   Manufacturer     Answer:   [employer]     Order Specific Question:   Perfomed by/Witnessed by:     Answer:   fh     Order Specific Question:   NDC#     Answer:   76195-093-26   ??? degarelix (FIRMAGON) 80 mg solr injection     Sig: 80  mg by SubCUTAneous route once for 1 dose.     Dispense:  1 Each     Refill:  0   ??? denosumab (XGEVA) soln injection     Sig: 1.7 mL by SubCUTAneous route once for 1 dose.     Dispense:  1.7 mL     Refill:  0       Patient did not supply own medication.      Axel Filler

## 2017-03-21 NOTE — Progress Notes (Signed)
I was present in the office and agree with the assessment and plan.   John Higgins

## 2017-03-25 ENCOUNTER — Ambulatory Visit: Admit: 2017-03-25 | Payer: MEDICARE | Attending: Physician Assistant | Primary: Family Medicine

## 2017-03-25 DIAGNOSIS — C61 Malignant neoplasm of prostate: Secondary | ICD-10-CM

## 2017-03-25 LAB — AMB POC URINALYSIS DIP STICK AUTO W/O MICRO
Bilirubin (UA POC): NEGATIVE
Blood (UA POC): NEGATIVE
Ketones (UA POC): NEGATIVE
Leukocyte esterase (UA POC): NEGATIVE
Nitrites (UA POC): NEGATIVE
Protein (UA POC): NEGATIVE
Specific gravity (UA POC): 1.02 (ref 1.001–1.035)
Urobilinogen (UA POC): 0.2 (ref 0.2–1)
pH (UA POC): 5 (ref 4.6–8.0)

## 2017-03-25 NOTE — Progress Notes (Signed)
John Higgins  Dec 08, 1940      Impression:  Encounter Diagnoses     ICD-10-CM ICD-9-CM   1. Prostate cancer metastatic to multiple sites ([place]) C61 185     199.0   2. Right flank Higgins R10.9 789.09     cT2bN0M1b Prostate Cancer with Gleason 5+5 w/ bone mets:   -Bone scan and CT scan on 04/2016 showed extensive osseous metastatic disease   - Initiated on Degerelix 05/14/16   -Started [name] 06/11/16 (pt edentulous, so no dental clearance needed)   -Enrolled in ARCHES trial 06/21/16   -PSA 1.35 as of 02/20/17   -Bone scan and CT performed 12/12/16 - bone scan with improvement in bone mets   -Bone scan 03/11/17:  bone scan with stable number of bone mets, but increased activity in right superior pubic ramus, upper margin of the l eft SI joint, left intertrochanteric region, left scapula    - CT stable    Significant CAD history - CABG 2005, multiple coronary stents most recently 05/2014, managed by John Higgins    Plan:  Explained what metastatic prostate cancer is. Reassured patient we have no evidence he is in imminent danger of death from prostate cancer at this time.   Reviewed imaging and Higgins and lack of correlation.   Advised that sedentary lifestyle can cause Higgins.    Recommended he increase his vitD3 to 3,000iu daily   Tylenol OTC for Higgins.   Follow up with John Higgins as scheduled in august.   Continue Degarelix and [name], injections   Continue Vit D and Ca supplements (Prosteon)  F/u as arranged by ARCHES trial    Chief Complaint   Patient presents with   ??? Prostate Cancer       HPI: John Higgins, 75 y.o. John Higgins today for right flank Higgins.   Patient states he has had right hip Higgins for the past year and feels it has gradually spread up his right side.  He denies activity that could have exacerbated Higgins such as lifting heavy items or excessive exercise.    Patient states he does nothing but sit all day.  Patient is frustrated he feels he doesn't know what is going on and thinks no-one is telling him he  is going to dies soon.        Pt underwent prostate biopsy for PSA of 43. Biopsy positive for gleason 10 disease.  Bone scan showed extensive osseous metastatic disease with innumerable lesions of the axial and proximal appendicular skeleton. Pt initiated on Degarelix on 05/14/16. Initiated on XGeva 06/11/16. Most recent injections given 02/20/17. Pt previously reported continued right thigh Higgins, present for many weeks, Higgins resolved after treatment. He is no longer using a cane.    Most recent bone scan on 03/11/17 showed overall stable number of bone mets.  Radiologist noted:  "interval increase in the degree of radiotracer activity within the right superior pubic ramus and also upper margin of the left SI joint compared to the previous study. ??There is also slight increased radiotracer activity within the left intertrochanteric region compared to the previous study. ??There is also increasing radiotracer localization within the left scapula compared to the previous study."      Most recent PSA of 1.35 in 02/2017.      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  Past Medical History:   Diagnosis Date   ??? CAD (coronary artery disease) 2005, 2015    CABG 2005, multiple stents, most recently in 05/2014   ??? Diabetes ([place])    ??? DM (diabetes mellitus screen)    ??? GERD (gastroesophageal reflux disease)    ??? HTN (hypertension)    ??? Liver disorder    ??? Prostate cancer ([place])     cT2bN0M1b Prostate Cancer w/ bone mets   ??? Thyroid disease          Past Surgical History:   Procedure Laterality Date   ??? CARDIAC SURG PROCEDURE UNLIST       cardiac bypass 2005 stent placement   ??? HX CATARACT REMOVAL Left 08/16/2015   ??? HX CHOLECYSTECTOMY  1974   ??? 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 HX SPLENECTOMY           Allergies   Allergen Reactions   ??? Naproxen Hives         Current Outpatient Prescriptions:    ???  metoprolol succinate (TOPROL-XL) 50 mg XL tablet, , Disp: , Rfl:   ???  nitroglycerin (NITROSTAT) 0.4 mg SL tablet, , Disp: , Rfl:   ???  isosorbide dinitrate (ISORDIL) 30 mg tablet, Take 30 mg by mouth four (4) times daily., Disp: , Rfl:   ???  gabapentin (NEURONTIN) 300 mg capsule, , Disp: , Rfl:   ???  glimepiride (AMARYL) 1 mg tablet, , Disp: , Rfl:   ???  simvastatin (ZOCOR) 40 mg tablet, , Disp: , Rfl:   ???  tamsulosin (FLOMAX) 0.4 mg capsule, , Disp: , Rfl:   ???  clopidogrel (PLAVIX) 75 mg tablet, , Disp: , Rfl:   ???  METFORMIN HCL (METFORMIN PO), Take  by mouth., Disp: , Rfl:   ???  LEVOTHYROXINE SODIUM (LEVOTHYROXINE PO), Take  by mouth., Disp: , Rfl:   ???  losartan (COZAAR) 50 mg tablet, Take  by mouth daily., Disp: , Rfl:   ???  aspirin 81 mg tablet, Take 81 mg by mouth., Disp: , Rfl:     History reviewed. No pertinent family history.  Social History     Social History   ??? Marital status: SINGLE     Spouse name: N/A   ??? Number of children: N/A   ??? Years of education: N/A     Social History Main Topics   ??? Smoking status: Former Smoker   ??? Smokeless tobacco: Never Used   ??? Alcohol use No   ??? Drug use: No   ??? Sexual activity: Not Asked     Other Topics Concern   ??? None     Social History Narrative         Review of Systems  Constitutional: Fever: No  Skin: Rash: No  HEENT: Hearing difficulty: No  Eyes: Blurred vision: No  Cardiovascular: Chest Higgins: No  Respiratory: Shortness of breath: No  Gastrointestinal: Nausea/vomiting: No  Musculoskeletal: Back Higgins: No  Neurological: Weakness: No  Psychological: Memory loss: No  Comments/additional findings:     PE:  Visit Vitals   ??? BP 114/70   ??? Ht 5' 9"  (1.753 m)   ??? Wt 171 lb (77.6 kg)   ??? BMI 25.25 kg/m2     Constitutional: WDWN, Pleasant and appropriate affect, No acute distress.    GU: no CVA tenderness   CV: no lower leg swelling  Resp: normal respiratory effort  ABD: non-distended  Musc:normal gait and station  Neuro: CN II-XII grossly intact.    Psych: normal affect, well groomed, appropriate answers, A&Ox3        CT A/P WO cont 04/19/2016;  Impression:   1. Findings concerning for new osseous metastasis.  Bone scan recommended for further assessment.     2.  Interval postsurgical changes of the distal pancreas and spleen.     3.  No obvious metastasis to the liver, though evaluation is limited on a noncontrast study.  No significant abdominal or pelvic adenopathy.     4.  Small fat containing a focal hernia and patulous left inguinal ring.     5.  Enlarged prostate.    -Bone Scan 05/01/2016  Impression:  Extensive osseous metastatic disease with innumerable lesions of the axial and proximal appendicular skeleton.    Prostate Biopsy 05/14/16:  SURGICAL PATHOLOGY REPORT   ??   CLINICAL HISTORY/INFORMATION:    Previous Biopsy Result: Negative 2013   Treatment: Observation   DRE: Prostate 40 grams and nodule at the left apex   Other Pertinent Clinical History: DNA Kit# AC [id number]   Last PSA Result: Rising ~ 43.03 on 04/15/2016   Number of Containers Submitted: 6        ??   DIAGNOSIS:   A. Right Apex Needle biopsy ??   ?? ?? ?? ?? ?? ?? 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 ?? ?? ?? ?? ?? ??(1 of 1 core(s) positive). Grade Group 5. Tumor involves one end.   B. Right Mid Needle biopsy ??   ?? ?? ?? ?? ?? ?? ??ADENOCARCINOMA, GLEASON SCORE 5 + 5 = 10 involving 40 % of the specimen (1 of 1 core(s) positive).   ?? ?? ?? ?? ?? ?? ??Grade Group 5. Tumor involves one end.   C. Right Base Needle biopsy ??   ?? ?? ?? ?? ?? ?? ??ADENOCARCINOMA, GLEASON SCORE 5 + 4 = 9 involving 3 % of the specimen (1 of 1 core(s) positive).   ?? ?? ?? ?? ?? ?? ??Grade Group 5. Ends not involved by the tumor.   D. Left Apex Needle biopsy ??   ?? ?? ?????? ?? ?? ??Benign prostatic tissue.   E. Left Mid Needle biopsy ??   ?? ?? ?? ?? ?? ?? ??Benign prostatic tissue.   F. Left Base Needle biopsy ??   ?? ?? ?? ?? ?? ?? ??Benign prostatic tissue.       Rowe Clack PA-C   [place] [address]  [phone] Main Number  [phone] Fax       MV:[id number] Candyce Churn, MD

## 2017-04-23 ENCOUNTER — Encounter: Primary: Family Medicine

## 2017-04-24 ENCOUNTER — Institutional Professional Consult (permissible substitution): Admit: 2017-04-24 | Discharge: 2017-04-24 | Payer: MEDICARE | Primary: Family Medicine

## 2017-04-24 DIAGNOSIS — C61 Malignant neoplasm of prostate: Secondary | ICD-10-CM

## 2017-04-24 MED ORDER — DEGARELIX 80 MG SUB-Q SOLN
80 mg | Freq: Once | SUBCUTANEOUS | 0 refills | Status: AC
Start: 2017-04-24 — End: 2017-04-24

## 2017-04-24 MED ORDER — DENOSUMAB 120 MG/1.7 ML (70 MG/ML) SUB-Q
120 mg/1.7 mL (70 mg/mL) | Freq: Once | SUBCUTANEOUS | 0 refills | Status: AC
Start: 2017-04-24 — End: 2017-04-24

## 2017-04-24 NOTE — Progress Notes (Signed)
John Higgins is a 76 y.o. male who is here today per the order of Dr. Given  to receive Degarelix and [name].  Patient's identity has been verified.   Dr. Jimmye Norman  was available in the clinic as incident to provider.     Patient has not had any dental procedures since his last injection.  Patient denies any upcoming dental procedures.  Patient  denies jaw pain.  Patient has Dentures.    Patient has been taking supplements of at least 500 mg of calcium and 400 international units of Vitamin D daily.  This is not his first ADT injection.  This is not his first DENOSUMAB injection.  He has had the necessary labs drawn.      [name]  is administered to abdomen ( left) SQ without difficulty.     Patient tolerated the injection well.    Patient has been scheduled for the next injection which will be due in approximately 1 months    Degarelix  is administered to abdomen ( right) SQ without difficulty.     Patient tolerated the injection well.    Patient has been scheduled for the next injection which will be due in approximately 1 months  Patient has a follow-up appointment with provider on June 04, 2017      Encounter Diagnoses   Name Primary?   ??? Prostate cancer metastatic to multiple sites [facility]) Yes   ??? Bone metastases ([place])        Orders Placed This Encounter   ??? THER/PROPH/DIAG INJECTION, SUBCUT/IM   ??? CHEMOTHER HORMON ANTINEOPL SUB-Q/IM   ??? PR DEGARELIX INJECTION     Order Specific Question:   Charge Quantity?     Answer:   54     Order Specific Question:   Dose     Answer:   80 mg     Comments:         Order Specific Question:   Site     Answer:   RIGHT LOWER QUAD ABDOMEN     Order Specific Question:   Expiration Date     Answer:   05/25/2019     Order Specific Question:   Lot#     Answer:   [id number]     Order Specific Question:   Manufacturer     Answer:   Arlana Lindau     Order Specific Question:   Perfomed by/Witnessed by:     Answer:   fh     Order Specific Question:   NDC#     Answer:   [address]-2    ??? PR DENOSUMAB INJECTION     Order Specific Question:   Charge Quantity?     Answer:   120     Order Specific Question:   Dose     Answer:   120 mg     Order Specific Question:   Site     Answer:   LEFT LOWER QUAD ABDOMEN     Order Specific Question:   Expiration Date     Answer:   03/25/2019     Order Specific Question:   Lot#     Answer:   [id number]     Order Specific Question:   Manufacturer     Answer:   [employer]     Order Specific Question:   Perfomed by/Witnessed by:     Answer:   fh     Order Specific Question:   NDC#     Answer:   02542-706-23   ???  degarelix (FIRMAGON) 80 mg solr injection     Sig: 80 mg by SubCUTAneous route once for 1 dose.     Dispense:  1 Each     Refill:  0   ??? denosumab (XGEVA) soln injection     Sig: 1.7 mL by SubCUTAneous route once for 1 dose.     Dispense:  1.7 mL     Refill:  0       Patient did not supply own medication.      Axel Filler   Reviewed by Renford Dills, MD

## 2017-04-25 NOTE — Telephone Encounter (Signed)
Spoke with pt and rescheduled appt

## 2017-04-25 NOTE — Telephone Encounter (Signed)
Pt called and wants to move his July 20th injection as he will be out of town July 18-24th. I wasn't sure what the time frame on this one was and didn't want to reschedule incorrectly. Please return pt's call to # [phone]. thanks

## 2017-05-23 ENCOUNTER — Encounter: Primary: Family Medicine

## 2017-05-28 ENCOUNTER — Encounter

## 2017-05-28 ENCOUNTER — Institutional Professional Consult (permissible substitution): Admit: 2017-05-28 | Discharge: 2017-05-28 | Payer: MEDICARE | Primary: Family Medicine

## 2017-05-28 DIAGNOSIS — C61 Malignant neoplasm of prostate: Secondary | ICD-10-CM

## 2017-05-28 MED ORDER — DENOSUMAB 120 MG/1.7 ML (70 MG/ML) SUB-Q
120 mg/1.7 mL (70 mg/mL) | Freq: Once | SUBCUTANEOUS | 0 refills | Status: AC
Start: 2017-05-28 — End: 2017-05-28

## 2017-05-28 MED ORDER — DEGARELIX 80 MG SUB-Q SOLN
80 mg | Freq: Once | SUBCUTANEOUS | 0 refills | Status: AC
Start: 2017-05-28 — End: 2017-05-28

## 2017-05-28 NOTE — Progress Notes (Signed)
Patient is scheduled to arrive at [facility] 05/29/17 at 11am. Patient is aware.

## 2017-05-28 NOTE — Progress Notes (Signed)
John SPADAFORE is a 76 y.o. male who is here today per the order of Dr. Given  to receive Degarelix and [name]  and to have labs drawn.   Patient's identity has been verified.   Dr. Norberto Sorenson  was available in the clinic as incident to provider.     Patient has not had any dental procedures since his last injection.  Patient has not  any upcoming dental procedures.  Patient  denies jaw pain.  Patient wears DENTURES.      Patient has been taking supplements of at least 500 mg of calcium and 400 international units of Vitamin D daily.  This is not his first ADT injection.  This is not his first DENOSUMAB injection.  He has had the necessary labs drawn.  PSA, TST, Calcium and Vitamin D obtained via venipuncture without any difficulty.  Patient will be notified with lab results.     [name]  is administered to abdomen ( right) SQ without difficulty.     Patient tolerated the injection well.    Patient has been scheduled for the next injection which will be due in approximately 1 months    Degarelix  is administered to abdomen ( left) SQ without difficulty.     Patient tolerated the injection well.    Patient has been scheduled for the next injection which will be due in approximately 1 months  Patient has a follow-up appointment with provider on 06/04/2017      Encounter Diagnoses   Name Primary?   ??? Prostate cancer metastatic to multiple sites [facility]) Yes   ??? Bone metastases ([place])    ??? Vitamin D deficiency        Orders Placed This Encounter   ??? THER/PROPH/DIAG INJECTION, SUBCUT/IM   ??? PROSTATE SPECIFIC ANTIGEN, TOTAL (PSA)   ??? TESTOSTERONE   ??? VITAMIN D, 25 HYDROXY   ??? CALCIUM   ??? CHEMOTHER HORMON ANTINEOPL SUB-Q/IM   ??? PR DEGARELIX INJECTION     Order Specific Question:   Charge Quantity?     Answer:   56     Order Specific Question:   Dose     Answer:   80 mg     Order Specific Question:   Site     Answer:   LEFT LOWER QUAD ABDOMEN     Order Specific Question:   Expiration Date     Answer:   05/29/2019      Order Specific Question:   Lot#     Answer:   [id number]     Order Specific Question:   Manufacturer     Answer:   Arlana Lindau     Order Specific Question:   Perfomed by/Witnessed by:     Answer:   fh     Order Specific Question:   NDC#     Answer:   [address]-9   ??? PR DENOSUMAB INJECTION     Order Specific Question:   Charge Quantity?     Answer:   120     Order Specific Question:   Dose     Answer:   120 mg     Order Specific Question:   Site     Answer:   RIGHT LOWER QUAD ABDOMEN     Order Specific Question:   Expiration Date     Answer:   03/29/2019     Order Specific Question:   Lot#     Answer:   [id number]     Order Specific Question:  Manufacturer     Answer:   [employer]     Order Specific Question:   Perfomed by/Witnessed by:     Answer:   fh     Order Specific Question:   NDC#     Answer:   65784-696-29   ??? COLLECTION VENOUS BLOOD,VENIPUNCTURE   ??? degarelix (FIRMAGON) 80 mg solr injection     Sig: 80 mg by SubCUTAneous route once for 1 dose.     Dispense:  1 Each     Refill:  0   ??? denosumab (XGEVA) soln injection     Sig: 1.7 mL by SubCUTAneous route once for 1 dose.     Dispense:  1.7 mL     Refill:  0       Patient did not supply own medication.      Axel Filler   Reviewed by Drusilla Kanner, MD

## 2017-05-29 LAB — PROSTATE SPECIFIC ANTIGEN, TOTAL (PSA): Prostate Specific Ag: 4.3 ng/mL — ABNORMAL HIGH (ref 0.00–4.00)

## 2017-05-29 LAB — TESTOSTERONE: Testosterone: <3 ng/dL — ABNORMAL LOW (ref 348–1197)

## 2017-05-29 LAB — CALCIUM: Calcium: 9.9 mg/dL (ref 8.6–10.2)

## 2017-05-30 ENCOUNTER — Encounter

## 2017-06-03 ENCOUNTER — Encounter

## 2017-06-04 ENCOUNTER — Encounter: Attending: Urology | Primary: Family Medicine

## 2017-06-04 LAB — VITAMIN D, 25 HYDROXY: VITAMIN D, 25-HYDROXY: >60 ng/ml (ref 30.0–100.0)

## 2017-06-06 ENCOUNTER — Encounter: Attending: Physician Assistant | Primary: Family Medicine

## 2017-06-06 ENCOUNTER — Ambulatory Visit: Admit: 2017-06-06 | Discharge: 2017-06-06 | Payer: MEDICARE | Attending: Physician Assistant | Primary: Family Medicine

## 2017-06-06 DIAGNOSIS — C61 Malignant neoplasm of prostate: Secondary | ICD-10-CM

## 2017-06-06 LAB — PSA, DIAGNOSTIC (PROSTATE SPECIFIC AG): Prostate Specific Ag: 6.23

## 2017-06-06 NOTE — Progress Notes (Signed)
John Higgins  09-Aug-1941      Impression:  Encounter Diagnoses     ICD-10-CM ICD-9-CM   1. Prostate cancer metastatic to multiple sites ([place]) C61 185     199.0   2. 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:   -Bone scan and CT scan on 04/2016 showed extensive osseous metastatic disease   - Initiated on Degerelix 05/14/16, most recent injection 05/28/17   -Started XGeva 06/11/16 (pt edentulous, so no dental clearance needed), most recent injection 05/28/17   -Enrolled in [facility] trial 06/21/16   -Bone scan and CT performed 12/12/16 - bone scan with improvement in bone mets   -Bone scan 03/11/17:  bone scan with stable number of bone mets, but increased activity in right superior pubic ramus, upper margin of the left SI  joint, left intertrochanteric region, left scapula    - CT stable 03/11/17   -CT 05/28/17 stable   -Bone scan 05/28/17 - There is interval development of a new metastatic focus within the right humeral head. ??There is also a new focus of increased  or tracer localization seen within the right hemisacrum near the SI joint suggesting additional metastatic disease. ??Since the previous study, there is  increasing radiotracer localization seen within the upper thoracic spine, right acetabulum, along the left SI joint, left superior pubic ramus and  intertrochanteric region of the proximal left femur suggesting progression of metastatic disease.    Thoracic involvement documented as 15 foci, previously 40 foci in May. Will need clarification from the radiologist   -Most recent PSA of 4.3 drawn 05/28/17. Previous PSA 1.74 drawn in May 2018    Significant CAD history - CABG 2005, multiple coronary stents most recently 05/2014, managed by Dr Jerline Pain    Plan:  Reviewed PSA - 4.3, previously 1.74 in May 2018  Reviewed CT and bone scan with pt.  John Higgins will contact radiologist to clarify thoracic findings.  If cancer is progressing, she will discuss  with Dr Given having pt come off the study   Recommend ADT PT evaluation to put together an exercise program for pt, however, he declined   Follow up with Dr. Gearldine Bienenstock as scheduled  Continue Degarelix and XGeva, injections.  Continue Vit D and Ca supplements (Prosteon)  F/u as arranged by ARCHES trial    Chief Complaint   Patient presents with   ??? Prostate Cancer       HPI: John Higgins, 76 y.o. Skip Mayer today for pursuant in [facility] clinical trial.       Pt initially underwent prostate biopsy for PSA of 43. Biopsy positive for gleason 10 disease.  Bone scan showed extensive osseous metastatic disease with innumerable lesions of the axial and proximal appendicular skeleton. Pt initiated on Degarelix on 05/14/16. Initiated on XGeva 06/11/16. Most recent injections given 05/28/17. Pt previously reported continued right thigh pain, present for many weeks, pain resolved with treatment. He is no longer using a cane.       Most recent PSA of 4.3 drawn 05/28/17.  Most recent CT performed 05/28/17 stable. Bone scan is confusing.  Radiologist reports worsening thoracic mets, however, he went from 40 foci in May to 15 foci on 05/28/17. Also noted to have new metastatic focus within the right humeral head, a new focus of increased or tracer localization seen within the right hemisacrum near the SI joint suggesting additional metastatic disease.  The same radiologist read  both reports.    He does report bilateral lower back pain. He is not reporting significant hip pain.  He leads a sedentary lifestyle. He is not working out at all or walking. He has declined referral to PT for ADT PT exercise program.    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      Past Medical History:   Diagnosis Date   ??? CAD (coronary artery disease) 2005, 2015    CABG 2005, multiple stents, most recently in 05/2014   ??? Diabetes ([place])    ??? DM (diabetes mellitus screen)     ??? GERD (gastroesophageal reflux disease)    ??? HTN (hypertension)    ??? Liver disorder    ??? Prostate cancer ([place])     cT2bN0M1b Prostate Cancer w/ bone mets   ??? Thyroid disease          Past Surgical History:   Procedure Laterality Date   ??? CARDIAC SURG PROCEDURE UNLIST       cardiac bypass 2005 stent placement   ??? HX CATARACT REMOVAL Left 08/16/2015   ??? HX CHOLECYSTECTOMY  1974   ??? 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 HX SPLENECTOMY           Allergies   Allergen Reactions   ??? Naproxen Hives         Current Outpatient Prescriptions:   ???  metoprolol succinate (TOPROL-XL) 50 mg XL tablet, , Disp: , Rfl:   ???  nitroglycerin (NITROSTAT) 0.4 mg SL tablet, , Disp: , Rfl:   ???  isosorbide dinitrate (ISORDIL) 30 mg tablet, Take 30 mg by mouth four (4) times daily., Disp: , Rfl:   ???  gabapentin (NEURONTIN) 300 mg capsule, , Disp: , Rfl:   ???  glimepiride (AMARYL) 1 mg tablet, , Disp: , Rfl:   ???  simvastatin (ZOCOR) 40 mg tablet, , Disp: , Rfl:   ???  tamsulosin (FLOMAX) 0.4 mg capsule, , Disp: , Rfl:   ???  clopidogrel (PLAVIX) 75 mg tablet, , Disp: , Rfl:   ???  METFORMIN HCL (METFORMIN PO), Take  by mouth., Disp: , Rfl:   ???  LEVOTHYROXINE SODIUM (LEVOTHYROXINE PO), Take  by mouth., Disp: , Rfl:   ???  losartan (COZAAR) 50 mg tablet, Take  by mouth daily., Disp: , Rfl:   ???  aspirin 81 mg tablet, Take 81 mg by mouth., Disp: , Rfl:     History reviewed. No pertinent family history.  Social History     Social History   ??? Marital status: SINGLE     Spouse name: N/A   ??? Number of children: N/A   ??? Years of education: N/A     Social History Main Topics   ??? Smoking status: Former Smoker   ??? Smokeless tobacco: Never Used   ??? Alcohol use No   ??? Drug use: No   ??? Sexual activity: Not Asked     Other Topics Concern   ??? None     Social History Narrative         Review of Systems  Constitutional: Fever: No  Skin: Rash: No  HEENT: Hearing difficulty: No  Eyes: Blurred vision: No  Cardiovascular: Chest pain: No   Respiratory: Shortness of breath: No  Gastrointestinal: Nausea/vomiting: No  Musculoskeletal: Back pain: No  Neurological: Weakness:  No  Psychological: Memory loss: No  Comments/additional findings:     PE:  Visit Vitals   ??? BP 120/60   ??? Pulse 81   ??? Temp 98.3 ??F (36.8 ??C)   ??? Resp 12   ??? Ht 5' 9"  (1.753 m)   ??? Wt 172 lb (78 kg)   ??? BMI 25.4 kg/m2     Constitutional: WDWN, Pleasant and appropriate affect, No acute distress.    GU: no CVA tenderness   CV: no peripheral edema  Resp: normal respiratory effort, lungs CTAB  ABD: non-distended  Musc:normal gait and station  Neuro: CN II-XII grossly intact.   Psych: normal affect, well groomed, appropriate answers, A&Ox3        CT A/P WO cont 04/19/2016;  Impression:   1. Findings concerning for new osseous metastasis.  Bone scan recommended for further assessment.     2.  Interval postsurgical changes of the distal pancreas and spleen.     3.  No obvious metastasis to the liver, though evaluation is limited on a noncontrast study.  No significant abdominal or pelvic adenopathy.     4.  Small fat containing a focal hernia and patulous left inguinal ring.     5.  Enlarged prostate.    -Bone Scan 05/01/2016  Impression:  Extensive osseous metastatic disease with innumerable lesions of the axial and proximal appendicular skeleton.    Prostate Biopsy 05/14/16:  SURGICAL PATHOLOGY REPORT   ??   CLINICAL HISTORY/INFORMATION:    Previous Biopsy Result: Negative 2013   Treatment: Observation   DRE: Prostate 40 grams and nodule at the left apex   Other Pertinent Clinical History: DNA Kit# [id number]   Last PSA Result: Rising ~ 43.03 on 04/15/2016   Number of Containers Submitted: 6        ??   DIAGNOSIS:   A. Right Apex Needle biopsy ??   ?? ?? ?? ?? ?? ?? 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 ?? ?? ?? ?? ?? ??(1 of 1 core(s) positive). Grade Group 5. Tumor involves one end.   B. Right Mid Needle biopsy ??    ?? ?? ?? ?? ?? ?? ??ADENOCARCINOMA, GLEASON SCORE 5 + 5 = 10 involving 40 % of the specimen (1 of 1 core(s) positive).   ?? ?? ?? ?? ?? ?? ??Grade Group 5. Tumor involves one end.   C. Right Base Needle biopsy ??   ?? ?? ?? ?? ?? ?? ??ADENOCARCINOMA, GLEASON SCORE 5 + 4 = 9 involving 3 % of the specimen (1 of 1 core(s) positive).   ?? ?? ?? ?? ?? ?? ??Grade Group 5. Ends not involved by the tumor.   D. Left Apex Needle biopsy ??   ?? ?? ?????? ?? ?? ??Benign prostatic tissue.   E. Left Mid Needle biopsy ??   ?? ?? ?? ?? ?? ?? ??Benign prostatic tissue.   F. Left Base Needle biopsy ??   ?? ?? ?? ?? ?? ?? ??Benign prostatic tissue.       Thresa Ross, PA        BM:[id number] Candyce Churn, MD

## 2017-06-06 NOTE — Progress Notes (Signed)
PROGRESS NOTE: [facility] Week 46 VISIT  Sponsor:  [employer]  Protocol: [id number]  Subject: [id number]  Date of Visit: 06 June 2017      VISIT TYPE    Subject [id number] presents today for visit Week 49 in the [facility] clinical trial.  Questionnaires were not completed due to the charging device being ineffective.  Vitals were performed on the patient per protocol and documented on the source.     Visit Vitals   ??? BP 120/60   ??? Pulse 81   ??? Temp 98.3 ??F (36.8 ??C)   ??? Resp 12   ??? Ht 5\' 9"  (1.753 m)   ??? Wt 172 lb (78 kg)   ??? BMI 25.4 kg/m2       A physical exam and ECOG assessment for this visit was performed by Wynn Banker, PA-C with no abnormalities observed on the physical exam and an ECOG of 1 assessed.  CT and Bone Scan were reviewed with the patient by Wynn Banker, PA-C and showed new evidence of metastatic disease.  All information  documented on the source.    IP Accountability:  The patient returned all used/unused IP and accountability was done.    INVESTIGATIONAL PRODUCT:  IP was dispensed to the patient and dosing instructions were reviewed with the subject.  He expressed understanding.      ADVERSE EVENTS:   Patient indicates previous AEs are ongoing/resolved   - 77mm non-obstructing kidney stone midportion right kidney 09/23/16 - ongoing   - Superficial subcutaneous fat stranding in anterior abdominal wall bilaterally 09/23/16 - ongoing    .  No new AEs to report.    CON MEDS:  Patient continues to take all previously disclosed medications and has not added any new medications.    Follow up for Week 61 scheduled on 29 August 2017.    Briscoe Burns, MA, CRC  Research Coordinator    FOR FULL DOCUMENTATION OF THIS CLINICAL TRIAL VISIT, PLEASE SEE THE SOURCE DOCUMENT, AS THIS IS THE FIRST PLACE VISIT INFORMATION FROM CLINICAL TRIAL VISITS IS RECORDED.

## 2017-06-26 ENCOUNTER — Institutional Professional Consult (permissible substitution): Admit: 2017-06-26 | Discharge: 2017-06-26 | Payer: MEDICARE | Primary: Family Medicine

## 2017-06-26 DIAGNOSIS — C61 Malignant neoplasm of prostate: Secondary | ICD-10-CM

## 2017-06-26 MED ORDER — DENOSUMAB 120 MG/1.7 ML (70 MG/ML) SUB-Q
120 mg/1.7 mL (70 mg/mL) | Freq: Once | SUBCUTANEOUS | 0 refills | Status: AC
Start: 2017-06-26 — End: 2017-06-26

## 2017-06-26 MED ORDER — DEGARELIX 80 MG SUB-Q SOLN
80 mg | Freq: Once | SUBCUTANEOUS | 0 refills | Status: AC
Start: 2017-06-26 — End: 2017-06-26

## 2017-06-26 NOTE — Progress Notes (Signed)
John Higgins is a 76 y.o. male who is here today per the order of Dr. Given  to receive Degarelix and [name]   Patient's identity has been verified.   Dr. Delena Bali  was available in the clinic as incident to provider.     Patient has not had any dental procedures since his last injection.  Patient denies any upcoming dental procedures.  Patient  denies jaw pain.  Patient wears DENTURES.     Patient has been taking supplements of at least 500 mg of calcium and 400 international units of Vitamin D daily.  This is not his first ADT injection.  This is not his first DENOSUMAB injection.  He has had the necessary labs drawn.  ts.     [name]  is administered to abdomen ( left) SQ without difficulty.     Patient tolerated the injection well.    Patient has been scheduled for the next injection which will be due in approximately 1 months    Degarelix  is administered to abdomen ( right) SQ without difficulty.     Patient tolerated the injection well.    Patient has been scheduled for the next injection which will be due in approximately 1 months  Patient has a follow-up appointment with provider on July 23, 2017      Encounter Diagnoses   Name Primary?   ??? Prostate cancer metastatic to multiple sites [facility]) Yes   ??? Bone metastases ([place])        Orders Placed This Encounter   ??? THER/PROPH/DIAG INJECTION, SUBCUT/IM   ??? CHEMOTHER HORMON ANTINEOPL SUB-Q/IM   ??? PR DEGARELIX INJECTION     Order Specific Question:   Charge Quantity?     Answer:   75     Order Specific Question:   Dose     Answer:   80 mg     Order Specific Question:   Site     Answer:   RIGHT LOWER QUAD ABDOMEN     Order Specific Question:   Expiration Date     Answer:   08/27/2019     Order Specific Question:   Lot#     Answer:   [id number]     Order Specific Question:   Manufacturer     Answer:   Arlana Lindau     Order Specific Question:   Perfomed by/Witnessed by:     Answer:   fh     Order Specific Question:   NDC#     Answer:   [address]-4    ??? PR DENOSUMAB INJECTION     Order Specific Question:   Charge Quantity?     Answer:   120     Order Specific Question:   Dose     Answer:   120 mg     Order Specific Question:   Site     Answer:   LEFT LOWER QUAD ABDOMEN     Order Specific Question:   Expiration Date     Answer:   08/27/2019     Order Specific Question:   Lot#     Answer:   [id number]     Order Specific Question:   Manufacturer     Answer:   [employer]     Order Specific Question:   Perfomed by/Witnessed by:     Answer:   fh     Order Specific Question:   NDC#     Answer:   86761-950-93   ??? degarelix (FIRMAGON) 80 mg  solr injection     Sig: 80 mg by SubCUTAneous route once for 1 dose.     Dispense:  1 Each     Refill:  0   ??? denosumab (XGEVA) soln injection     Sig: 1.7 mL by SubCUTAneous route once for 1 dose.     Dispense:  1.7 mL     Refill:  0       Patient did not supply own medication.      Axel Filler     Reviewed by Dionne Milo, MD

## 2017-07-09 ENCOUNTER — Encounter: Attending: Urology | Primary: Family Medicine

## 2017-07-16 ENCOUNTER — Encounter: Attending: Urology | Primary: Family Medicine

## 2017-07-16 ENCOUNTER — Ambulatory Visit: Admit: 2017-07-16 | Discharge: 2017-07-16 | Payer: MEDICARE | Attending: Urology | Primary: Family Medicine

## 2017-07-16 DIAGNOSIS — C61 Malignant neoplasm of prostate: Secondary | ICD-10-CM

## 2017-07-16 LAB — PSA, DIAGNOSTIC (PROSTATE SPECIFIC AG): Prostate Specific Ag: 10.02

## 2017-07-16 MED ORDER — ABIRATERONE 250 MG TABLET
250 mg | ORAL_TABLET | ORAL | 11 refills | Status: DC
Start: 2017-07-16 — End: 2018-01-27

## 2017-07-16 MED ORDER — PREDNISONE 5 MG TAB
5 mg | ORAL_TABLET | Freq: Two times a day (BID) | ORAL | 11 refills | Status: DC
Start: 2017-07-16 — End: 2018-02-24

## 2017-07-16 MED ORDER — PREDNISONE 5 MG TAB
5 mg | ORAL_TABLET | Freq: Every day | ORAL | 11 refills | Status: DC
Start: 2017-07-16 — End: 2017-07-16

## 2017-07-16 NOTE — Progress Notes (Signed)
Patient was  identified as a candidate to receive Zytiga daily per Dr. Gearldine Bienenstock.    While patient and friend were in office they were both counseled regarding usage of this medication, and lab schedule for treatment of his prostate cancer:  Reviewed Important Information with both patient and his friend, patient signed and received copy of form.  All questions were answered to their satisfaction and patient wishes to proceed with medication.  Pt was informed that [facility] is his specialty pharmacy. However, we'll be running it to see if we can dispense in-house.

## 2017-07-16 NOTE — Progress Notes (Addendum)
John Higgins came by office today and signed [facility] enrollment paperwork.  [facility]ge literature and tentative schedule reviewed with patient.  Informed patient and his friend there would be 4 visits to the [facility] approximately 4 hours long and 3 infusion appointments here at [facility], approximately 2 hours long.   Discussed the possibility of a port vs an IV for venous access for the infusion which would be determined at the first visit at the [facility]   Patient does require cardiac clearance.    Link where to find Provenge video was provided.     Premedication for infusion and side effects were reviewed with patient & his friend.    Informed John Higgins that office would contact him after completing the benefit verification process to review benefits and/or cost-share and initiate scheduling process.  Patient Acknowledgement form signed & scanned in chart.  John Higgins verbalized understanding and all questions were answered to his satisfaction.

## 2017-07-16 NOTE — Progress Notes (Signed)
John Higgins  Dec 09, 1940      Impression:  Encounter Diagnoses     ICD-10-CM ICD-9-CM   1. Prostate cancer metastatic to multiple sites ([place]) C61 185     199.0   2. 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:   -Bone scan and CT scan on 04/2016 showed extensive osseous metastatic disease   - Initiated on Degerelix 05/14/16, most recent injection 05/28/17   -Started XGeva 06/11/16 (pt edentulous, so no dental clearance needed), most recent injection 05/28/17   -Enrolled in ARCHES trial 06/21/16   -Bone scan and CT performed 12/12/16 - bone scan with improvement in bone mets   -Bone scan 03/11/17:  bone scan with stable number of bone mets, but increased activity in right superior pubic ramus, upper margin of the left SI joint, left intertrochanteric region, left scapula    - CT stable 03/11/17   -CT 05/28/17 stable   -Bone scan 05/28/17 - There is interval development of a new metastatic focus within the right humeral head. There is also a new focus of increased  or tracer localization seen within the right hemisacrum near the SI joint suggesting additional metastatic disease. ??Since the previous study, there is  increasing radiotracer localization seen within the upper thoracic spine, right acetabulum, along the left SI joint, left superior pubic ramus and intertrochanteric region of the proximal left femur suggesting progression of metastatic disease.    Thoracic involvement documented as 15 foci, previously 40 foci in May.    -Now with [facility]   -Most recent PSA of 4.3 drawn 05/28/17. Previous PSA 1.74 drawn in May 2018    Significant CAD history - CABG 2005, multiple coronary stents most recently 05/2014, managed by Dr Jerline Pain    Plan:  Discussed starting Zytiga w/Prednisone. Reviewed risks and benefits.  Discussed proceeding with Provenge. Reviewed risks and benefits  Continue Degarelix and XGeva, injections.  Continue Vit D and Ca supplements (Prosteon)   Will proceed off of ARCHES trial  FU in 3 months with SD PSA, T, Vit D, and Ca      DISCUSSION:Patient was identified as a candidate to receive Zytiga in conjunction with Prednisone daily.  Patient was counseled on the following items regarding usage of these medications for treatment of his prostate cancer:  - Zytiga and Prednisone are used together  - Patient should not stop or interrupt either of the medications without consulting their     Physician  - Patient should continue on their regular injection of Eligard/Degarelix  - Do not take Zytiga with food or consume food two hours prior or one hour after the     dose of Zytiga  - Pills should be swallowed whole with water without crushing or chewing.  - Zytiga is taken once daily and prednisone twice daily  - If a dose of Zytiga or prednisone is missed, resume the next normal dose.  If more than    one dose is skipped, notify their physician.   - Side effects associated with Zytiga such as peripheral edema, hypokalemia,    Hypertension, elevated liver function tests and urinary tract infection were reviewed  - Patient was Katelen Luepke the Patient Information sheet for Zytiga  - Patient was informed of the schedule for blood draws that are required for monitoring.  - Patient was instructed that Zytiga may harm a developing fetus and that pregnant    Women should avoid handling Zytiga.    -  Patient also informed that it is not known whether Zytiga or its metabolites are present     In semen therefore a condom should be worn during sex with a pregnant woman or a     Woman of child-bearing potential.       Patient's questions were answered and patient wishes to proceed with getting medication.        Chief Complaint   Patient presents with   ??? Clinical Trials     Arches EOS visit        HPI: John Higgins, 76 y.o. male who presents today for end of study visit of  Arches clinical trial.      He notes lower right back pain. Currently taking Tylenol and Tramadol with  some benefit.  No voiding complaints. Denies hematuria or dysuria.     Pt initially underwent prostate biopsy for PSA of 43. Biopsy positive for gleason 10 disease.  Bone scan showed extensive osseous metastatic disease with innumerable lesions of the axial and proximal appendicular skeleton. Pt initiated on Degarelix on 05/14/16. Initiated on XGeva 06/11/16. Most recent injections Yahaira Bruski 05/28/17.       Most recent PSA of 4.3 drawn 05/28/17.  Most recent CT performed 05/28/17 stable. Bone scan is confusing.  Radiologist reports worsening thoracic mets, however, he went from 40 foci in May to 15 foci on 05/28/17. Also noted to have new metastatic focus within the right humeral head, a new focus of increased or tracer localization seen within the right hemisacrum near the SI joint suggesting additional metastatic disease.  The same radiologist read both reports.    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      Past Medical History:   Diagnosis Date   ??? CAD (coronary artery disease) 2005, 2015    CABG 2005, multiple stents, most recently in 05/2014   ??? Diabetes ([place])    ??? DM (diabetes mellitus screen)    ??? GERD (gastroesophageal reflux disease)    ??? HTN (hypertension)    ??? Liver disorder    ??? Prostate cancer ([place])     cT2bN0M1b Prostate Cancer w/ bone mets   ??? Thyroid disease          Past Surgical History:   Procedure Laterality Date   ??? CARDIAC SURG PROCEDURE UNLIST       cardiac bypass 2005 stent placement   ??? HX CATARACT REMOVAL Left 08/16/2015   ??? HX CHOLECYSTECTOMY  1974   ??? 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 HX SPLENECTOMY           Allergies   Allergen Reactions   ??? Naproxen Hives         Current Outpatient Prescriptions:   ???  abiraterone (ZYTIGA) 250 mg tab, Take four tablets by mouth daily on an empty stomach.  Take one hour prior to food or two hours after food.  Tablets should be swallowed whole with water.  Do not crush or chew tablets.  Indications: metastatic castration-resistant prostate cancer, Disp: 120 Tab, Rfl: 11  ???  predniSONE (DELTASONE) 5 mg tablet, Take 1 Tab by mouth two (2) times a day., Disp: 60 Tab, Rfl: 11  ???  metoprolol succinate (TOPROL-XL) 50 mg XL tablet, , Disp: , Rfl:   ???  nitroglycerin (NITROSTAT) 0.4 mg SL tablet, , Disp: ,  Rfl:   ???  isosorbide dinitrate (ISORDIL) 30 mg tablet, Take 30 mg by mouth four (4) times daily., Disp: , Rfl:   ???  gabapentin (NEURONTIN) 300 mg capsule, , Disp: , Rfl:   ???  glimepiride (AMARYL) 1 mg tablet, , Disp: , Rfl:   ???  simvastatin (ZOCOR) 40 mg tablet, , Disp: , Rfl:   ???  tamsulosin (FLOMAX) 0.4 mg capsule, , Disp: , Rfl:   ???  clopidogrel (PLAVIX) 75 mg tablet, , Disp: , Rfl:   ???  METFORMIN HCL (METFORMIN PO), Take  by mouth., Disp: , Rfl:   ???  LEVOTHYROXINE SODIUM (LEVOTHYROXINE PO), Take  by mouth., Disp: , Rfl:   ???  losartan (COZAAR) 50 mg tablet, Take  by mouth daily., Disp: , Rfl:   ???  aspirin 81 mg tablet, Take 81 mg by mouth., Disp: , Rfl:     History reviewed. No pertinent family history.  Social History     Social History   ??? Marital status: SINGLE     Spouse name: N/A   ??? Number of children: N/A   ??? Years of education: N/A     Social History Main Topics   ??? Smoking status: Former Smoker   ??? Smokeless tobacco: Never Used   ??? Alcohol use No   ??? Drug use: No   ??? Sexual activity: Not Asked     Other Topics Concern   ??? None     Social History Narrative         Review of Systems  Constitutional: Fever: No  Skin: Rash: No  HEENT: Hearing difficulty: No  Eyes: Blurred vision: No  Cardiovascular: Chest pain: No  Respiratory: Shortness of breath: No  Gastrointestinal: Nausea/vomiting: No  Musculoskeletal: Back pain: No  Neurological: Weakness: No  Psychological: Memory loss: No  Comments/additional findings:     PE:  Visit Vitals   ??? BP 110/58   ??? Pulse 84   ??? Temp 98.3 ??F (36.8 ??C)   ??? Resp 12   ??? Ht 5' 9"  (1.753 m)    ??? Wt 169 lb (76.7 kg)   ??? BMI 24.96 kg/m2     Constitutional: WDWN, Pleasant and appropriate affect, No acute distress.    GU: no CVA tenderness   CV: no peripheral edema  Resp: normal respiratory effort, lungs CTAB  ABD: non-distended  Musc:normal gait and station  Neuro: CN II-XII grossly intact.   Psych: normal affect, well groomed, appropriate answers, A&Ox3        CT A/P WO cont 04/19/2016;  Impression:   1. Findings concerning for new osseous metastasis.  Bone scan recommended for further assessment.     2.  Interval postsurgical changes of the distal pancreas and spleen.     3.  No obvious metastasis to the liver, though evaluation is limited on a noncontrast study.  No significant abdominal or pelvic adenopathy.     4.  Small fat containing a focal hernia and patulous left inguinal ring.     5.  Enlarged prostate.    -Bone Scan 05/01/2016  Impression:  Extensive osseous metastatic disease with innumerable lesions of the axial and proximal appendicular skeleton.    Prostate Biopsy 05/14/16:  SURGICAL PATHOLOGY REPORT   ??   CLINICAL HISTORY/INFORMATION:    Previous Biopsy Result: Negative 2013   Treatment: Observation   DRE: Prostate 40 grams and nodule at the left apex   Other Pertinent Clinical History: DNA Kit# AC [id number]  Last PSA Result: Rising ~ 43.03 on 04/15/2016   Number of Containers Submitted: 6        ??   DIAGNOSIS:   A. Right Apex Needle biopsy ??   ?? ?? ?? ?? ?? ?? 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 ?? ?? ?? ?? ?? ??(1 of 1 core(s) positive). Grade Group 5. Tumor involves one end.   B. Right Mid Needle biopsy ??   ?? ?? ?? ?? ?? ?? ??ADENOCARCINOMA, GLEASON SCORE 5 + 5 = 10 involving 40 % of the specimen (1 of 1 core(s) positive).   ?? ?? ?? ?? ?? ?? ??Grade Group 5. Tumor involves one end.   C. Right Base Needle biopsy ??   ?? ?? ?? ?? ?? ?? ??ADENOCARCINOMA, GLEASON SCORE 5 + 4 = 9 involving 3 % of the specimen (1 of 1 core(s) positive).    ?? ?? ?? ?? ?? ?? ??Grade Group 5. Ends not involved by the tumor.   D. Left Apex Needle biopsy ??   ?? ?? ?????? ?? ?? ??Benign prostatic tissue.   E. Left Mid Needle biopsy ??   ?? ?? ?? ?? ?? ?? ??Benign prostatic tissue.   F. Left Base Needle biopsy ??   ?? ?? ?? ?? ?? ?? ??Benign prostatic tissue.       Lab Results   Component Value Date/Time    Calcium 9.9 05/28/2017 01:48 PM           Herbie [place] Reha Martinovich MD   Urologic Oncologist  [facility] Hermelinda Medicus MD Professorship  Professor of Urology  [facility] [place] [phone]        OV:FIEPPIRJ Candyce Churn, MD      Medical documentation provided with the assistance of Georges Mouse, medical scribe to Okey Regal Consepcion Utt, MD on 07/16/2017.

## 2017-07-16 NOTE — Progress Notes (Signed)
PROGRESS NOTE: Arches End of Study VISIT  Sponsor:  [employer]  Protocol: [id number]  Subject: [id number]  Date of Visit: 16 July 2017      VISIT TYPE    Subject [id number] presents today for visit End of Study in the Arches clinical trial.  Questionnaires was not completed.  Vitals were performed on the patient per protocol and documented on the source.     Visit Vitals   ??? BP 110/58   ??? Pulse 84   ??? Temp 98.3 ??F (36.8 ??C)   ??? Resp 12   ??? Ht 5\' 9"  (1.753 m)   ??? Wt 169 lb (76.7 kg)   ??? BMI 24.96 kg/m2       A physical exam and ECOG assessment for this visit was performed by Daylene Katayama, M.D. with no abnormalities observed on the physical exam and an ECOG of 0 assessed.   All information documented on the source.    IP Accountability:  The patient forgot to bring back his IP, however, he is due to come back into the office next week and will bring it back at that time.     INVESTIGATIONAL PRODUCT: No IP was dispensed as patient is coming off of the study effective today.      ADVERSE EVENTS:   Patient indicates previous AEs are ongoing/resolved  - 30mm non-obstructing kidney stone midportion right kidney 09/23/16 - ongoing  - superficial subcutaneous fat-stranding anterior abdominal wall bilaterally 09/23/16 - ongoing    No new AEs reported    CON MEDS:  Patient continues to take all previously disclosed medications and has not added any new medications.    The New ICF (Version 4, dated 05/23/17) was presented and reviewed with the patient.  All changes have been discussed and all questions have been answered.  The patient has expressed that he would like to continue in the clinical trial.  The new ICF has been signed/dated by all parties and a copy has been given to the patient.      Briscoe Burns, MA, CRC  Research Coordinator    FOR FULL DOCUMENTATION OF THIS CLINICAL TRIAL VISIT, PLEASE SEE THE SOURCE DOCUMENT, AS THIS IS THE FIRST PLACE VISIT INFORMATION FROM CLINICAL TRIAL VISITS IS RECORDED.

## 2017-07-23 ENCOUNTER — Encounter: Attending: Urology | Primary: Family Medicine

## 2017-07-24 ENCOUNTER — Telehealth

## 2017-07-24 ENCOUNTER — Institutional Professional Consult (permissible substitution): Admit: 2017-07-24 | Discharge: 2017-07-24 | Payer: MEDICARE | Primary: Family Medicine

## 2017-07-24 DIAGNOSIS — C61 Malignant neoplasm of prostate: Secondary | ICD-10-CM

## 2017-07-24 MED ORDER — DEGARELIX 80 MG SUB-Q SOLN
80 mg | Freq: Once | SUBCUTANEOUS | 0 refills | Status: AC
Start: 2017-07-24 — End: 2017-07-24

## 2017-07-24 MED ORDER — DENOSUMAB 120 MG/1.7 ML (70 MG/ML) SUB-Q
120 mg/1.7 mL (70 mg/mL) | Freq: Once | SUBCUTANEOUS | 0 refills | Status: AC
Start: 2017-07-24 — End: 2017-07-24

## 2017-07-24 NOTE — Progress Notes (Signed)
John Higgins is a 76 y.o. male who is here today per the order of Dr. Given  to receive Degarelix and [name].   Patient's identity has been verified.   Dr. Jimmye Norman  was available in the clinic as incident to provider.     Patient has not had any dental procedures since his last injection.  Patient denies any upcoming dental procedures.  Patient  denies jaw pain.  Patient wears Dentures.      Patient has  been taking supplements of at least 500 mg of calcium and 400 international units of Vitamin D daily.  This is not his first ADT injection.  This is not his first DENOSUMAB injection.  He has had the necessary labs drawn.      Degarelix  is administered to abdomen ( left) SQ without difficulty.     Patient tolerated the injection well.    Patient has been scheduled for the next injection which will be due in approximately 1 months    [name]  is administered to abdomen ( right) SQ without difficulty.     Patient tolerated the injection well.    Patient has been scheduled for the next injection which will be due in approximately 1 months  Patient has a follow-up appointment with provider on August 29, 2017      Encounter Diagnoses   Name Primary?   ??? Prostate cancer metastatic to multiple sites [facility]) Yes   ??? Bone metastases ([place])        Orders Placed This Encounter   ??? THER/PROPH/DIAG INJECTION, SUBCUT/IM   ??? CHEMOTHER [place] ANTINEOPL SUB-Q/IM   ??? PR DEGARELIX INJECTION     Order Specific Question:   Charge Quantity?     Answer:   7     Order Specific Question:   Dose     Answer:   80 mg     Order Specific Question:   Site     Answer:   LEFT LOWER QUAD ABDOMEN     Order Specific Question:   Expiration Date     Answer:   08/24/2019     Order Specific Question:   Lot#     Answer:   [id number]     Order Specific Question:   Manufacturer     Answer:   Arlana Lindau     Order Specific Question:   Perfomed by/Witnessed by:     Answer:   fh     Order Specific Question:   NDC#     Answer:   [address]-7    ??? PR DENOSUMAB INJECTION     Order Specific Question:   Charge Quantity?     Answer:   120     Order Specific Question:   Dose     Answer:   120 mg     Order Specific Question:   Site     Answer:   RIGHT LOWER QUAD ABDOMEN     Order Specific Question:   Expiration Date     Answer:   08/24/2019     Order Specific Question:   Lot#     Answer:   [id number]     Order Specific Question:   Manufacturer     Answer:   [employer]     Order Specific Question:   Perfomed by/Witnessed by:     Answer:   fh     Order Specific Question:   NDC#     Answer:   87564-332-95   ??? degarelix (FIRMAGON) 80  mg solr injection     Sig: 80 mg by SubCUTAneous route once for 1 dose.     Dispense:  1 Each     Refill:  0   ??? denosumab (XGEVA) soln injection     Sig: 1.7 mL by SubCUTAneous route once for 1 dose.     Dispense:  1.7 mL     Refill:  0       Patient did not supply own medication.      Axel Filler     Reviewed by Renford Dills, MD

## 2017-07-24 NOTE — Progress Notes (Signed)
02/02/18- Pt approved through Patient [place] (eff. 05/07/2017 thru 08/04/2018). Fabio Asa will be dispensed through specialty pharmacy, [facility] ph.: [phone]. All refills and/or prescription inquiries should be routed to them. Pt aware of approval and understands pharmacy will contact pt within 1-2 days from receipt of Rx to confirm shipping address and to schedule shipment; prior to each subsequent fill, the pharmacy will call pt to set-up shipment of med.

## 2017-07-24 NOTE — Progress Notes (Signed)
07/25/17-No auth or deposit required for Provenge and admin.    07/25/17-Per Lyndee Leo auth approved #[id number] good through 11/04/16-11/03/17), a test claim has been done and the estimated co-pay for John Higgins will be $2395.43 and for Prednisone-$3.35. IOD is permitted.

## 2017-07-24 NOTE — Telephone Encounter (Signed)
Called Mr. Lieske to ask if he was able to go to [facility] to get his vein assessment on 9/25 @ 9:45 am. Pt stated he has a doctor's name written on his calendar but not sure what for. Asked if I could see if Monday or Wed was available instead. Informed I would check and call him back.

## 2017-07-24 NOTE — Telephone Encounter (Signed)
-----   Message from [facility] sent at 07/24/2017  1:54 PM EDT -----  Regarding: Contact  Patient was here for his injection and wanted to know when he would be scheduled for Provenge.  Can you please give him a call to let him know what is going on.  Thank you.  Briscoe Burns, MA, CRC

## 2017-07-24 NOTE — Progress Notes (Signed)
Per orders of Dr. Gearldine Bienenstock I have referred Aundra Dubin to the Benefits Dept to begin benefit verification for Provenge.   07/24/2017   Dosage:  50 million units  Frequency: every 2 weeks x 3   Appt Date: TBD  Dx Code:  C61  Secondary Dx Code: C79.51 (required for Prolia, Xgeva, Xtandi, Zytiga, [place])  Patient's ECOG Performance Status:  1  Cancer Staging: [id number] Prostate Cancer with Gleason 5+5 w/ bone mets:      Per orders of Dr. Gearldine Bienenstock  I have referred Aundra Dubin to the Benefits Dept to begin benefit verification for Zytiga.    07/24/2017   Dosage:  1000mg   Frequency: QD   Appt Date: TBD  Dx Code:  Nereus.Few  Secondary Dx Code: C79.51 (required for Prolia, Xgeva, Xtandi, Zytiga, Xofigo & Provenge)  Patient's ECOG Performance Status:  1  Cancer Staging:  [id number] Prostate Cancer with Gleason 5+5 w/ bone mets:

## 2017-07-24 NOTE — Progress Notes (Signed)
08/05/17-pt has been approved for PANF, IOD is permitted. Script will be sent to dispensary [place]). Info sent to nurse navigator.

## 2017-07-24 NOTE — Progress Notes (Signed)
08/05/17- Rx handed to [place] to fill as per RWG pt to take Zytiga and do Provenge at same time.

## 2017-07-25 NOTE — Telephone Encounter (Signed)
Pt informed that [facility] [employer] are can see him for his vein assessment on 08/01/17 @ 9:30am, but not the other dates he requested. Pt okay with the 28th appt. Appt ltr mailed to pt.

## 2017-07-30 NOTE — Addendum Note (Signed)
Addended by: Thea Silversmith D on: 07/30/2017 01:31 PM      Modules accepted: Orders

## 2017-08-04 ENCOUNTER — Encounter

## 2017-08-04 NOTE — Progress Notes (Signed)
Pt is scheduled to start Provenge on the following days, pt is aware and treatment schedule has been mailed:    Treatment # 1) August 15, 2017 @ 9:00 am   ?? August 18, 2017 @ 1:00 pm    Treatment # 2) August 29, 2017 @ 9:00 am    ?? September 01, 2017 @ 2:45 pm    Treatment # 3) September 12, 2017 @ 9:00 am       ?? September 15, 2017 @ 1:00 pm

## 2017-08-06 NOTE — Progress Notes (Signed)
Pt picked up his Zytiga and Prednisone today. Lorne Skeens

## 2017-08-06 NOTE — Telephone Encounter (Signed)
Pt has Zytiga and Prednisone ready for pick up in the [facility]pensary. There is no co-pay. I spoke with pt today and he has agreed to pick it up today. Lorne Skeens

## 2017-08-18 ENCOUNTER — Ambulatory Visit: Admit: 2017-08-18 | Payer: MEDICARE | Primary: Family Medicine

## 2017-08-18 DIAGNOSIS — C61 Malignant neoplasm of prostate: Secondary | ICD-10-CM

## 2017-08-18 MED ORDER — SIPULEUCEL-T IN LACTATED RINGERS 50 MILLION CELL/250 ML IV
50 million cell/2 mL | Freq: Once | INTRAVENOUS | 0 refills | Status: AC
Start: 2017-08-18 — End: 2017-08-18

## 2017-08-18 NOTE — Progress Notes (Signed)
Provenge #1     Premedicated John Higgins  with Tylenol 500mg  with Lot# [id number] & Exp: 10/19??and Benadryl 25mg ??Lot# [id number] & Exp: 09/19, 30 minutes prior to infusion.  Inserted PIV #22g to left antecubital arm. ??  Started administration of Provenge 50 million @2 :30 pm with lot #[id number]  &??exp date/time: 08/18/17  @16 :44 EDT.   Infused for about an 1 hour 15 min and completed without any complications @ 5:63 pm .   D/C'd PIV, dressing applied to site.   Vital signs checked, 20 minutes after completion of the infusion @ 4:05 : BP: 120/70, P:64, R: 20,T: 98.47F.Pt wanted to leave early. ??  Discharged Mr. Thone with post infusion instructions.  Answered all of [name]'s questions.   Dr.Jack Quentin Ore??was available in the clinic as incident provider.Attempted to draw blood for CMP without success. Added CMP on his next visit this week.Harrie Jeans      Orders Placed This Encounter   ??? PR CHEMOTHER, IV INFUSION, 1 HR   ??? SIPULEUCEL-T AUTO CD54+     Order Specific Question:   NDC#     Answer:   [address]-5   ??? Sipuleucel-T in LR (PROVENGE) susp     Sig: 250 mL by IntraVENous route once for 1 dose.     Dispense:  1 Each     Refill:  0       Reviewed by Young Berry, MD on 08/18/2017 and agree with the above.

## 2017-08-21 ENCOUNTER — Encounter

## 2017-08-22 ENCOUNTER — Institutional Professional Consult (permissible substitution): Admit: 2017-08-22 | Discharge: 2017-08-22 | Payer: MEDICARE | Primary: Family Medicine

## 2017-08-22 DIAGNOSIS — C7951 Secondary malignant neoplasm of bone: Secondary | ICD-10-CM

## 2017-08-22 MED ORDER — DENOSUMAB 120 MG/1.7 ML (70 MG/ML) SUB-Q
120 mg/1.7 mL (70 mg/mL) | Freq: Once | SUBCUTANEOUS | 0 refills | Status: AC
Start: 2017-08-22 — End: 2017-08-22

## 2017-08-22 MED ORDER — DEGARELIX 80 MG SUB-Q SOLN
80 mg | Freq: Once | SUBCUTANEOUS | 0 refills | Status: AC
Start: 2017-08-22 — End: 2017-08-22

## 2017-08-22 NOTE — Progress Notes (Signed)
Patient is scheduled to arrive at [facility] 08/26/17 at 12:30pm. Patient is aware.

## 2017-08-22 NOTE — Progress Notes (Signed)
John Higgins is a 76 y.o. male who is here today per the order of Dr. Given  to receive Degarelix and [name]  and to have labs drawn.   Patient's identity has been verified.   Dr. Jimmye Norman  was available in the clinic as incident to provider.     Patient has not had any dental procedures since his last injection.  Patient denies any upcoming dental procedures.  Patient  denies jaw pain.  Patient wears Dentures.     Patient has been taking supplements of at least 500 mg of calcium and 400 international units of Vitamin D daily.  This is not his first ADT injection.  This is not  his first DENOSUMAB injection.  He has had the necessary labs drawn.  CMP obtained via venipuncture without any difficulty.  Patient will be notified with lab results.     [name]  is administered to abdomen ( left) SQ without difficulty.     Patient tolerated the injection well.    Patient has been scheduled for the next injection which will be due in approximately 47months    Degarelix  is administered to abdomen ( right) SQ without difficulty.     Patient tolerated the injection well.    Patient has been scheduled for the next injection which will be due in approximately 1 months  Patient has a follow-up appointment with provider on October 07, 2017      Encounter Diagnoses   Name Primary?   ??? Bone metastases ([place]) Yes   ??? Prostate cancer ([place])        Orders Placed This Encounter   ??? THER/PROPH/DIAG INJECTION, SUBCUT/IM   ??? COMPREHENSIVE METABOLIC PANEL   ??? CHEMOTHER HORMON ANTINEOPL SUB-Q/IM   ??? PR DEGARELIX INJECTION     Order Specific Question:   Charge Quantity?     Answer:   93     Order Specific Question:   Dose     Answer:   120 mg     Order Specific Question:   Site     Answer:   RIGHT LOWER QUAD ABDOMEN     Order Specific Question:   Expiration Date     Answer:   09/23/2019     Order Specific Question:   Lot#     Answer:   [id number]     Order Specific Question:   Manufacturer     Answer:   Arlana Lindau      Order Specific Question:   Perfomed by/Witnessed by:     Answer:   fh     Order Specific Question:   NDC#     Answer:   [address]-5 [[id number]]   ??? PR DENOSUMAB INJECTION     Order Specific Question:   Charge Quantity?     Answer:   120     Order Specific Question:   Dose     Answer:   120 mg     Order Specific Question:   Site     Answer:   LEFT LOWER QUAD ABDOMEN     Order Specific Question:   Expiration Date     Answer:   09/23/2019     Order Specific Question:   Lot#     Answer:   [id number]     Order Specific Question:   Manufacturer     Answer:   [employer]     Order Specific Question:   Perfomed by/Witnessed by:     Answer:   fh  Order Specific Question:   NDC#     Answer:   54098-119-14 [[id number]]   ??? COLLECTION VENOUS BLOOD,VENIPUNCTURE   ??? degarelix (FIRMAGON) 80 mg solr injection     Sig: 80 mg by SubCUTAneous route once for 1 dose.     Dispense:  1 Each     Refill:  0   ??? denosumab (XGEVA) soln injection     Sig: 1.7 mL by SubCUTAneous route once for 1 dose.     Dispense:  1.7 mL     Refill:  0       Patient did not supply own medication.      Axel Filler         Reviewed by Renford Dills, MD

## 2017-08-23 LAB — METABOLIC PANEL, COMPREHENSIVE
A-G Ratio: 1.7 (ref 1.2–2.2)
ALT (SGPT): 15 IU/L (ref 0–44)
AST (SGOT): 22 IU/L (ref 0–40)
Albumin: 4 g/dL (ref 3.5–4.8)
Alk. phosphatase: 89 IU/L (ref 39–117)
BUN/Creatinine ratio: 16 (ref 10–24)
BUN: 14 mg/dL (ref 8–27)
Bilirubin, total: 0.5 mg/dL (ref 0.0–1.2)
CO2: 23 mmol/L (ref 20–29)
Calcium: 9.8 mg/dL (ref 8.6–10.2)
Chloride: 105 mmol/L (ref 96–106)
Creatinine: 0.89 mg/dL (ref 0.76–1.27)
GFR est AA: 95 mL/min/{1.73_m2} (ref >59)
GFR est non-AA: 82 mL/min/{1.73_m2} (ref >59)
GLOBULIN, TOTAL: 2.4 g/dL (ref 1.5–4.5)
Glucose: 194 mg/dL — ABNORMAL HIGH (ref 65–99)
Potassium: 4.8 mmol/L (ref 3.5–5.2)
Protein, total: 6.4 g/dL (ref 6.0–8.5)
Sodium: 143 mmol/L (ref 134–144)

## 2017-08-28 ENCOUNTER — Encounter

## 2017-08-29 ENCOUNTER — Encounter: Attending: Urology | Primary: Family Medicine

## 2017-08-29 ENCOUNTER — Encounter

## 2017-09-01 ENCOUNTER — Encounter: Primary: Family Medicine

## 2017-09-01 ENCOUNTER — Ambulatory Visit: Admit: 2017-09-01 | Discharge: 2017-09-02 | Payer: MEDICARE | Primary: Family Medicine

## 2017-09-01 DIAGNOSIS — C61 Malignant neoplasm of prostate: Secondary | ICD-10-CM

## 2017-09-01 NOTE — Telephone Encounter (Signed)
error 

## 2017-09-01 NOTE — Progress Notes (Signed)
John Higgins presents today for Provenge infusion therapy 2 of 3 per the orders of Dr. Gearldine Bienenstock.    Dr. Hassell Done is  available in the clinic as incident provider.     Provenge #2    Obtained verbal consent from patient to initiate provenge therapy today.    Visit Vitals  BP 122/62   Pulse 68   Temp 98.3 ??F (36.8 ??C)   Resp 20       Inserted PIV #[id number] to antecubital right arm. ??  Started administration of Provenge 50 million at 4:15 pm:  Lot #[id number]  Exp Date / Time: 09/01/17 / 6:52 pm.     Infused for about an hour and completed without any complications at 4:01 pm.   D/C'd PIV, dressing applied to site.     Vital signs checked, 30 minutes after completion of the infusion at 5:44 pm:  BP: 150/80, P: 66, R: 18,T:98.1 F. ??  John Higgins was discharged with post infusion instructions and all of his questions were answered.     Orders Placed This Encounter   ??? COMPREHENSIVE METABOLIC PANEL   ??? PR CHEMOTHER, IV INFUSION, 1 HR   ??? SIPULEUCEL-T AUTO CD54+     Order Specific Question:   NDC#     Answer:   [address]-4   ??? Sipuleucel-T in LR (PROVENGE) susp     Sig: 250 mL by IntraVENous route once for 1 dose.     Dispense:  1 Each     Refill:  0       Octavia Bruckner, RN     Reviewed by Maida Sale. Hassell Done, MD.

## 2017-09-01 NOTE — Progress Notes (Signed)
Provenge #2    Premedicated Mr. John Higgins @ [phone]  with Tylenol 500mg  with Lot# [id number] & Exp: 08/2018??and Benadryl 25mg ??Lot# [id number] & Exp: 07/2018, 30 minutes prior to infusion.    Viann Shove, LPN

## 2017-09-02 LAB — METABOLIC PANEL, COMPREHENSIVE
A-G Ratio: 1.5 (ref 1.2–2.2)
ALT (SGPT): 18 IU/L (ref 0–44)
AST (SGOT): 46 IU/L — ABNORMAL HIGH (ref 0–40)
Albumin: 3.7 g/dL (ref 3.5–4.8)
Alk. phosphatase: 90 IU/L (ref 39–117)
BUN/Creatinine ratio: 10 (ref 10–24)
BUN: 11 mg/dL (ref 8–27)
Bilirubin, total: 0.4 mg/dL (ref 0.0–1.2)
CO2: 19 mmol/L — ABNORMAL LOW (ref 20–29)
Calcium: 9.3 mg/dL (ref 8.6–10.2)
Chloride: 102 mmol/L (ref 96–106)
Creatinine: 1.08 mg/dL (ref 0.76–1.27)
GFR est AA: 76 mL/min/{1.73_m2} (ref >59)
GFR est non-AA: 65 mL/min/{1.73_m2} (ref >59)
GLOBULIN, TOTAL: 2.5 g/dL (ref 1.5–4.5)
Glucose: 314 mg/dL — ABNORMAL HIGH (ref 65–99)
Potassium: 5.5 mmol/L — ABNORMAL HIGH (ref 3.5–5.2)
Protein, total: 6.2 g/dL (ref 6.0–8.5)
Sodium: 138 mmol/L (ref 134–144)

## 2017-09-02 MED ORDER — SIPULEUCEL-T IN LACTATED RINGERS 50 MILLION CELL/250 ML IV
50 million cell/2 mL | Freq: Once | INTRAVENOUS | 0 refills | Status: AC
Start: 2017-09-02 — End: 2017-09-02

## 2017-09-02 NOTE — Telephone Encounter (Signed)
Monthly Questionnaire for [facility] [employer]  Are you taking your medication, ZYTIGA as prescribed?  yes     If on Zytiga, when was the last CMP done? 09/02/17      Have you experienced any new or worsening side effects from this medication in the last 30 days?  no     Have you experienced any new or worsening pain in the last 30 days?  no   If so, where?      Pain severity:        Have you experienced any changes in your ability to perform your usual daily activities in the last 30 days?  no             Have you experienced any significant changes in your weight or appetite in the last 30 days?  no    Have you experienced any significant changes in your ability to sleep in the last 30 days?  no    Have you experienced any increase in fatigue over the last 30 days?  no    Patient's next appointment is scheduled for 10/07/17.    Patient has agreed to pick-up their medication on 09/02/17 and I have confirmed he has enough to last until then.

## 2017-09-15 ENCOUNTER — Emergency Department: Admit: 2017-09-16 | Payer: MEDICARE | Primary: Family Medicine

## 2017-09-15 ENCOUNTER — Ambulatory Visit: Admit: 2017-09-15 | Discharge: 2017-09-16 | Payer: MEDICARE | Primary: Family Medicine

## 2017-09-15 DIAGNOSIS — C61 Malignant neoplasm of prostate: Secondary | ICD-10-CM

## 2017-09-15 DIAGNOSIS — A419 Sepsis, unspecified organism: Principal | ICD-10-CM

## 2017-09-15 MED ORDER — SIPULEUCEL-T IN LACTATED RINGERS 50 MILLION CELL/250 ML IV
50 million cell/2 mL | Freq: Once | INTRAVENOUS | 0 refills | Status: DC
Start: 2017-09-15 — End: 2017-09-18

## 2017-09-15 NOTE — Progress Notes (Signed)
Provenge #3     Premedicated Mr. John Higgins  with Tylenol 500mg  with Lot# [id number] & Exp: 10/19??and Benadryl 25mg ??Lot# [id number] & Exp: 09/19, 30 minutes prior to infusion.  Inserted PIV #22g to right arm with 3 attemps. ??  Started administration of Provenge 50 million @2 :00 pm with lot #[id number]  &??exp date/time: 09/15/17  @17 :05 EDT.   Infused for about an 1 hour 10 min and completed without any complications @ 0:86 pm .   D/C'd PIV, dressing applied to site.   Vital signs checked, 30 minutes after completion of the infusion @ 4:40 : BP: 130/70, P:64, R: 20,T: 98.80F. ??  Discharged Mr. John Higgins with post infusion instructions.  Answered all of [name]'s questions.   Dr.Jack Quentin Ore??was available in the clinic as incident provider.Attempted to draw blood for CMP without success. Added CMP on his next visit this week.Harrie Jeans    Orders Placed This Encounter   ??? PR CHEMOTHER, IV INFUSION, 1 HR   ??? SIPULEUCEL-T AUTO CD54+     Order Specific Question:   NDC#     Answer:   [address]-5   ??? Sipuleucel-T in LR (PROVENGE) susp     Sig: 250 mL by IntraVENous route once for 1 dose.     Dispense:  1 Each     Refill:  0         Reviewed by Young Berry, MD on 09/15/2017 and agree with the above.

## 2017-09-15 NOTE — ED Provider Notes (Signed)
John Higgins  Emergency Department Treatment Report    Patient: John Higgins Age: 76 y.o. Sex: male    Date of Birth: 1941-03-11 Admit Date: 09/15/2017 PCP: Anne Fu, MD   MRN: [id number]  CSN: [id number]  Attn: Maylon Cos, MD   Room: HG99/ME26 Time Dictated: 8:45 PM APP: Fuller Song, PA-C       Chief Complaint   Shaking chills  History of Present Illness   76 y.o. male presents stating he started with shaking chills around 1830 tonight he was "shaking like a leaf" is felt nauseated and has some pain kind of in the middle of his abdomen that is gone has had no vomiting or diarrhea does not know if he had a fever he did not check it.  He denies headache to me denies any cough no shortness of breath no chest pain no URI symptoms.  He has had 5 episodes of this since he was diagnosed with prostate cancer in 2017.  He mentions that he had a procedure done to remove blood cells 4 days ago and then they were infused today at his urologist office.  He states some of the episodes of followed the procedure but others  Occurred before he started the procedures.  He is not had a flu shot.  He traveled to [place] a week ago for a funeral.  He denies any leg pain or swelling.    Review of Systems   Review of Systems   Constitutional: Positive for chills.   HENT:        No URI symptoms   Eyes:        No visual change   Respiratory: Negative for cough and shortness of breath.    Cardiovascular: Negative for chest pain and palpitations.   Gastrointestinal: Positive for nausea. Negative for constipation, diarrhea and vomiting.        Had some abdominal pain initially unable to describe it but does not have it now still feels nauseated   Genitourinary:        No urinary symptoms   Musculoskeletal: Negative for neck pain.   Skin: Negative for rash.   Neurological: Negative for dizziness, sensory change, focal weakness, weakness and headaches.   All other systems reviewed and are negative.       Past Medical/Surgical History   Echocardiogram September 2018 showed a 63% EF  Past Medical History:   Diagnosis Date   ??? CAD (coronary artery disease) 2005, 2015    CABG 2005, multiple stents, most recently in 05/2014   ??? Diabetes ([place])    ??? DM (diabetes mellitus screen)    ??? GERD (gastroesophageal reflux disease)    ??? HTN (hypertension)    ??? Liver disorder    ??? Prostate cancer ([place])     [id number] Prostate Cancer w/ bone mets   ??? Thyroid disease      Past Surgical History:   Procedure Laterality Date   ??? CARDIAC SURG PROCEDURE UNLIST       cardiac bypass 2005 stent placement   ??? HX CATARACT REMOVAL Left 08/16/2015   ??? HX CHOLECYSTECTOMY  1974   ??? 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 HX SPLENECTOMY         Social History     Social History     Socioeconomic History   ??? Marital status: DIVORCED     Spouse name: Not on file   ???  Number of children: Not on file   ??? Years of education: Not on file   ??? Highest education level: Not on file   Social Needs   ??? Financial resource strain: Not on file   ??? Food insecurity - worry: Not on file   ??? Food insecurity - inability: Not on file   ??? Transportation needs - medical: Not on file   ??? Transportation needs - non-medical: Not on file   Occupational History   ??? Not on file   Tobacco Use   ??? Smoking status: Former Smoker   ??? Smokeless tobacco: Never Used   Substance and Sexual Activity   ??? Alcohol use: No   ??? Drug use: No   ??? Sexual activity: Not on file   Other Topics Concern   ??? Not on file   Social History Narrative   ??? Not on file     No tobacco no alcohol  Family History   History reviewed. No pertinent family history.  History of colon cancer  Current Medications     Current Facility-Administered Medications   Medication Dose Route Frequency Provider Last Rate Last Dose   ??? [START ON 09/16/2017] piperacillin-tazobactam (ZOSYN) 3.375 g in 0.9% sodium chloride (MBP/ADV) 100 mL MBP  3.375 g IntraVENous Q8H Maylon Cos, MD        ??? vancomycin (VANCOCIN) 1,500 mg in 0.9% sodium chloride 500 mL IVPB  1,500 mg IntraVENous ONCE Maylon Cos, MD 250 mL/hr at 09/15/17 2208 1,500 mg at 09/15/17 2208     Current Outpatient Medications   Medication Sig Dispense Refill   ??? Sipuleucel-T in LR (PROVENGE) susp 250 mL by IntraVENous route once for 1 dose. 1 Each 0   ??? abiraterone (ZYTIGA) 250 mg tab Take four tablets by mouth daily on an empty stomach.  Take one hour prior to food or two hours after food. Tablets should be swallowed whole with water.  Do not crush or chew tablets.  Indications: metastatic castration-resistant prostate cancer 120 Tab 11   ??? predniSONE (DELTASONE) 5 mg tablet Take 1 Tab by mouth two (2) times a day. 60 Tab 11   ??? metoprolol succinate (TOPROL-XL) 50 mg XL tablet      ??? nitroglycerin (NITROSTAT) 0.4 mg SL tablet      ??? isosorbide dinitrate (ISORDIL) 30 mg tablet Take 30 mg by mouth four (4) times daily.     ??? gabapentin (NEURONTIN) 300 mg capsule      ??? glimepiride (AMARYL) 1 mg tablet      ??? simvastatin (ZOCOR) 40 mg tablet      ??? tamsulosin (FLOMAX) 0.4 mg capsule      ??? clopidogrel (PLAVIX) 75 mg tablet      ??? METFORMIN HCL (METFORMIN PO) Take  by mouth.     ??? LEVOTHYROXINE SODIUM (LEVOTHYROXINE PO) Take  by mouth.     ??? losartan (COZAAR) 50 mg tablet Take  by mouth daily.     ??? aspirin 81 mg tablet Take 81 mg by mouth.         Allergies     Allergies   Allergen Reactions   ??? Naproxen Hives       Physical Exam     Visit Vitals  BP 112/55   Pulse (!) 101   Temp (!) 100.7 ??F (38.2 ??C)   Resp 20   Ht _0  (1.753 m)   Wt 74.8 kg (165 lb)   SpO2 94%  BMI 24.37 kg/m??     Physical Exam   Constitutional: He is oriented to person, place, and time.   Very pleasant male   HENT:   TMs are clear mucous membranes moist little bit pale pink throat is clear   Eyes: Pupils are equal, round, and reactive to light.   Conjunctiva little pale sclera anicteric   Neck: Neck supple.   Nontender    Cardiovascular: Regular rhythm. Exam reveals no friction rub.   No murmur heard.  Tachycardic   Pulmonary/Chest: Effort normal and breath sounds normal. No respiratory distress. He has no wheezes. He has no rales.   He is not tachypneic or dyspneic with conversation   Abdominal: Soft. There is no tenderness.   I could not elicit any tenderness no organomegaly no mass no pulsatile masses   Musculoskeletal:   Extremities are warm dry well-perfused nontender symmetrical pulses calves are soft and nontender no edema   Neurological: He is alert and oriented to person, place, and time. He has normal reflexes. No cranial nerve deficit.   Strength and sensation are intact   Skin: Skin is warm and dry. No rash noted.   Psychiatric: Memory, affect and judgment normal.   Vitals reviewed.      Impression and Management Plan   77 year old male presents for evaluation of shaking chills.  He is currently being treated for prostate cancer he received stem cells today.  He is tachycardic and has a temp of 99.8.  He had abdominal pain initially that is resolved he still feels nauseated.  With his history of current treatment as well as his history of splenectomy and being on prednisone external he needs to be evaluated for an infectious cause for his symptoms.  With the tachycardia is been placed on blood pressure, pulse oximetry cardiac monitors.  EKG baseline lab work including sepsis labs have been started.  We will start him on a liter of fluids to see how he was response to that with his history of coronary disease bypass and stenting.  He does not show any signs of fluid overload at this time.  To be medicated with some Zofran for nausea.    Diagnostic Studies   Lab:   Recent Results (from the past 12 hour(s))   CBC WITH AUTOMATED DIFF    Collection Time: 09/15/17  9:00 PM   Result Value Ref Range    WBC 35.3 (H) 4.0 - 11.0 1000/mm3    RBC 4.32 3.80 - 5.70 M/uL    HGB 12.4 12.4 - 17.2 gm/dl    HCT 38.5 37.0 - 50.0 %     MCV 89.1 80.0 - 98.0 fL    MCH 28.7 23.0 - 34.6 pg    MCHC 32.2 30.0 - 36.0 gm/dl    PLATELET 361 140 - 450 1000/mm3    MPV 10.1 (H) 6.0 - 10.0 fL    RDW-SD 53.1 (H) 35.1 - 43.9      NRBC 0 0 - 0      IMMATURE GRANULOCYTES 1.1 0.0 - 3.0 %    NEUTROPHILS 84.9 (H) 34 - 64 %    LYMPHOCYTES 7.6 (L) 28 - 48 %    MONOCYTES 5.3 1 - 13 %    EOSINOPHILS 0.5 0 - 5 %    BASOPHILS 0.6 0 - 3 %   METABOLIC PANEL, COMPREHENSIVE    Collection Time: 09/15/17  9:00 PM   Result Value Ref Range    Sodium 133 (L) 136 -  145 mEq/L    Potassium 5.0 3.5 - 5.1 mEq/L    Chloride 101 98 - 107 mEq/L    CO2 23 21 - 32 mEq/L    Glucose 382 (H) 74 - 106 mg/dl    BUN 18 7 - 25 mg/dl    Creatinine 1.4 (H) 0.6 - 1.3 mg/dl    GFR est AA >60      GFR est non-AA 52      Calcium 8.8 8.5 - 10.1 mg/dl    AST (SGOT) 47 (H) 15 - 37 U/L    ALT (SGPT) 31 12 - 78 U/L    Alk. phosphatase 98 45 - 117 U/L    Bilirubin, total 0.8 0.2 - 1.0 mg/dl    Protein, total 6.9 6.4 - 8.2 gm/dl    Albumin 3.1 (L) 3.4 - 5.0 gm/dl    Anion gap 9 5 - 15 mmol/L   LIPASE    Collection Time: 09/15/17  9:00 PM   Result Value Ref Range    Lipase 236 73 - 393 U/L   LACTIC ACID    Collection Time: 09/15/17  9:00 PM   Result Value Ref Range    Lactic Acid 4.8 (HH) 0.4 - 2.0 mmol/L   PROCALCITONIN    Collection Time: 09/15/17  9:00 PM   Result Value Ref Range    PROCALCITONIN 0.57 (H) 0.00 - 0.50 ng/ml   URINALYSIS W/ RFLX MICROSCOPIC    Collection Time: 09/15/17  9:20 PM   Result Value Ref Range    Color YELLOW YELLOW,STRAW      Appearance CLEAR CLEAR      Glucose 500 (A) NEGATIVE,Negative mg/dl    Bilirubin NEGATIVE NEGATIVE,Negative      Ketone NEGATIVE NEGATIVE,Negative mg/dl    Specific gravity 1.015 1.005 - 1.030      Blood NEGATIVE NEGATIVE,Negative      pH (UA) 5.5 5.0 - 9.0      Protein NEGATIVE NEGATIVE,Negative mg/dl    Urobilinogen 0.2 0.0 - 1.0 mg/dl    Nitrites NEGATIVE NEGATIVE,Negative      Leukocyte Esterase NEGATIVE NEGATIVE,Negative     INFLUENZA A/B, BY PCR     Collection Time: 09/15/17  9:45 PM   Result Value Ref Range    Influenza A PCR NEGATIVE NEGATIVE      Influenza B PCR NEGATIVE NEGATIVE       EKG shows a sinus tachycardia with a rate of 117 a PR of 180 a QRS of 78 a QT of 308 a QTc 429 with no acute changes suggest ischemia as read by ED attending  Imaging:    Xr Chest Pa Lat    Result Date: 09/15/2017  INDICATION: fever; prostate cancer.  Dyspnea TECHNIQUE: PA/lateral Chest Radiograph. COMPARISON: 05/24/2009     IMPRESSION: Patient is status post sternotomy. Heart size is upper normal. There is bibasilar atelectatic change. No acute pulmonary infiltrate. Diffuse sclerotic osseous metastatic disease is seen throughout all the visualized bony structures.     Procedures    Medical Decision Making/ED Course   Findings were discussed.  Patient was started on sepsis fluid hydration and has tolerated it well.  He was started on vancomycin and Zosyn per the sepsis protocol.  Cultures are pending at this time.  Leukocytosis may be in part related to the steroids he is on however with the elevated lactic acid and the elevated pro-calcitonin will need to cover for sepsis.  Case has been discussed with Dr. [name] Bills who kindly accepts  this patient for admission PCU.  We started on Glucomander subcu orders.  He requested that we speak to the patient's hematologist and a call has been placed to Dr. Ozella Rocks.  Patient is remained hemodynamically stable here in the department.  Was given Tylenol for his fever.  At this time he feels much better.  He has no complaints of pain chills or nausea. Dr. Genevieve Norlander on-call for hemodynamic currently return call and they will consult on patient while he is here in the hospital.  No further orders from their standpoint.  She was given the opportunity ask questions and is comfortable with this plan.  Medications   piperacillin-tazobactam (ZOSYN) 3.375 g in 0.9% sodium chloride (MBP/ADV) 100 mL MBP (not administered)    vancomycin (VANCOCIN) 1,500 mg in 0.9% sodium chloride 500 mL IVPB (1,500 mg IntraVENous New Bag 09/15/17 2208)   sodium chloride (NS) flush 5-10 mL (10 mL IntraVENous Given 09/15/17 2109)   sodium chloride 0.9 % bolus infusion 1,000 mL (0 mL IntraVENous IV Completed 09/15/17 2214)   piperacillin-tazobactam (ZOSYN) 4.5 g in 0.9% sodium chloride (MBP/ADV) 100 mL MBP (0 g IntraVENous IV Completed 09/15/17 2208)   acetaminophen (TYLENOL) tablet 975 mg (975 mg Oral Given 09/15/17 2138)   sodium chloride 0.9 % bolus infusion 1,000 mL (1,000 mL IntraVENous New Bag 09/15/17 2211)     Followed by   sodium chloride 0.9 % bolus infusion 250 mL (250 mL IntraVENous New Bag 09/15/17 2211)       Final Diagnosis       ICD-10-CM ICD-9-CM   1. Sepsis, due to unspecified organism ([place]) A41.9 038.9     995.91   2. Uncontrolled type 2 diabetes mellitus with hyperglycemia ([facility]) E11.65 250.02   3. Acute kidney injury ([place]) N17.9 584.9       Disposition   Patient admitted to the PCU in stable condition.  Patient examined and evaluated by myself and Dr. Leonides Grills agrees with above assessment and plan.    Leatrice Jewels, PA-C  September 15, 2017    My signature above authenticates this document and my orders, the final ??  diagnosis (es), discharge prescription (s), and instructions in the Epic ??  record.  If you have any questions please contact [phone].  Dragon medical dictation software was used for portions of this report. Unintended voice recognition errors may occur.

## 2017-09-15 NOTE — H&P (Signed)
Medicine History and Physical  Patient: John Higgins Age: 76 y.o. Sex: male    Date of Birth: Jun 20, 1941 Admit Date: 09/15/2017 PCP: Anne Fu, MD   MRN: [id number]  CSN: [id number]         Assessment   Severe Sepsis with lactic acidosis   Uncontrolled type 2 diabetes mellitus with hyperglycemia ([place]) [E11.65]  Acute Renal insufficiency, baseline 1.0, today Cr 1.4  Lactic acidosis   CAD  DM  GERD  HTN  Thyroid disease   Hx of Prostate cancer     Plan               Chief Complaint:  Chief Complaint   Patient presents with   ??? Fever   ??? Headache         HPI:   John Higgins is a 76 y.o. year old male with hx of Prostate cancer with mets to bone who presents with shaking, fevers and chills, some nausea and vomiting, no cough, no diarrhea, pt has had similar episodes in past. Patient currently on treatment for prostate cancer. Patient febrile and tachycardic, has elevated WBC 35, lactic acidosis and new renal insufficiency, he does however take prednisone, CXR without infiltrate, UA -ve for infection, Flu swab pending, started on broad spectrum ABX Vancomycin and Zosyn, panCx pending. Hematology/Onc consulted.    Review of Systems - 12 Point ROS -ve except what is noted in the HPI.     Past Medical History:  Past Medical History:   Diagnosis Date   ??? CAD (coronary artery disease) 2005, 2015    CABG 2005, multiple stents, most recently in 05/2014   ??? Diabetes ([place])    ??? DM (diabetes mellitus screen)    ??? GERD (gastroesophageal reflux disease)    ??? HTN (hypertension)    ??? Liver disorder    ??? Prostate cancer ([place])     [id number] Prostate Cancer w/ bone mets   ??? Thyroid disease        Past Surgical History:  Past Surgical History:   Procedure Laterality Date   ??? CARDIAC SURG PROCEDURE UNLIST       cardiac bypass 2005 stent placement   ??? HX CATARACT REMOVAL Left 08/16/2015   ??? HX CHOLECYSTECTOMY  1974   ??? 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 HX SPLENECTOMY         Family History:   History reviewed. No pertinent family history.    Social History:  Social History     Socioeconomic History   ??? Marital status: DIVORCED     Spouse name: Not on file   ??? Number of children: Not on file   ??? Years of education: Not on file   ??? Highest education level: Not on file   Social Needs   ??? Financial resource strain: Not on file   ??? Food insecurity - worry: Not on file   ??? Food insecurity - inability: Not on file   ??? Transportation needs - medical: Not on file   ??? Transportation needs - non-medical: Not on file   Occupational History   ??? Not on file   Tobacco Use   ??? Smoking status: Former Smoker   ??? Smokeless tobacco: Never Used   Substance and Sexual Activity   ??? Alcohol use: No   ??? Drug use: No   ??? Sexual activity: Not on file   Other Topics Concern   ???  Not on file   Social History Narrative   ??? Not on file       Home Medications:  Prior to Admission medications    Medication Sig Start Date End Date Taking? Authorizing Provider   Sipuleucel-T in LR (PROVENGE) susp 250 mL by IntraVENous route once for 1 dose. 09/15/17 09/15/17  Given, Okey Regal, MD   abiraterone (ZYTIGA) 250 mg tab Take four tablets by mouth daily on an empty stomach.  Take one hour prior to food or two hours after food. Tablets should be swallowed whole with water.  Do not crush or chew tablets.  Indications: metastatic castration-resistant prostate cancer 07/16/17   Given, Okey Regal, MD   predniSONE (DELTASONE) 5 mg tablet Take 1 Tab by mouth two (2) times a day. 07/16/17   Given, Okey Regal, MD   metoprolol succinate (TOPROL-XL) 50 mg XL tablet  01/25/16   Provider, Historical   nitroglycerin (NITROSTAT) 0.4 mg SL tablet  04/12/16   Provider, Historical   isosorbide dinitrate (ISORDIL) 30 mg tablet Take 30 mg by mouth four (4) times daily.    Provider, Historical   gabapentin (NEURONTIN) 300 mg capsule  02/22/15   Provider, Historical   glimepiride (AMARYL) 1 mg tablet  04/04/15   Provider, Historical    simvastatin (ZOCOR) 40 mg tablet  02/22/15   Provider, Historical   tamsulosin (FLOMAX) 0.4 mg capsule  04/04/15   Provider, Historical   clopidogrel (PLAVIX) 75 mg tablet  04/04/15   Provider, Historical   METFORMIN HCL (METFORMIN PO) Take  by mouth.    Provider, Historical   LEVOTHYROXINE SODIUM (LEVOTHYROXINE PO) Take  by mouth.    Provider, Historical   losartan (COZAAR) 50 mg tablet Take  by mouth daily.    Provider, Historical   aspirin 81 mg tablet Take 81 mg by mouth.    Provider, Historical       Allergies:  Allergies   Allergen Reactions   ??? Naproxen Hives         Physical Exam:     Visit Vitals  BP 112/55   Pulse (!) 106   Temp (!) 100.7 ??F (38.2 ??C)   Resp 16   Ht 5' 9"  (1.753 m)   Wt 74.8 kg (165 lb)   SpO2 95%   BMI 24.37 kg/m??       Physical Exam:  General appearance: alert, cooperative, no distress, appears stated age  Head: Normocephalic, without obvious abnormality, atraumatic  Neck: supple, trachea midline  Lungs: clear to auscultation bilaterally  Heart: regular rate and rhythm, S1, S2 normal, no murmur, click, rub or gallop  Abdomen: soft, non-tender. Bowel sounds normal. No masses,  no organomegaly  Extremities: extremities normal, atraumatic, no cyanosis or edema  Skin: Skin color, texture, turgor normal. No rashes or lesions  Neurologic: Grossly normal    Intake and Output:  Current Shift:  11/12 1901 - 11/13 0700  In: 1100 [I.V.:1100]  Out: -   Last three shifts:  No intake/output data recorded.    Lab/Data Reviewed:  Lab:   Recent Results (from the past 12 hour(s))   CBC WITH AUTOMATED DIFF    Collection Time: 09/15/17  9:00 PM   Result Value Ref Range    WBC 35.3 (H) 4.0 - 11.0 1000/mm3    RBC 4.32 3.80 - 5.70 M/uL    HGB 12.4 12.4 - 17.2 gm/dl    HCT 38.5 37.0 - 50.0 %    MCV 89.1 80.0 - 98.0  fL    MCH 28.7 23.0 - 34.6 pg    MCHC 32.2 30.0 - 36.0 gm/dl    PLATELET 361 140 - 450 1000/mm3    MPV 10.1 (H) 6.0 - 10.0 fL    RDW-SD 53.1 (H) 35.1 - 43.9      NRBC 0 0 - 0       IMMATURE GRANULOCYTES 1.1 0.0 - 3.0 %    NEUTROPHILS 84.9 (H) 34 - 64 %    LYMPHOCYTES 7.6 (L) 28 - 48 %    MONOCYTES 5.3 1 - 13 %    EOSINOPHILS 0.5 0 - 5 %    BASOPHILS 0.6 0 - 3 %   METABOLIC PANEL, COMPREHENSIVE    Collection Time: 09/15/17  9:00 PM   Result Value Ref Range    Sodium 133 (L) 136 - 145 mEq/L    Potassium 5.0 3.5 - 5.1 mEq/L    Chloride 101 98 - 107 mEq/L    CO2 23 21 - 32 mEq/L    Glucose 382 (H) 74 - 106 mg/dl    BUN 18 7 - 25 mg/dl    Creatinine 1.4 (H) 0.6 - 1.3 mg/dl    GFR est AA >60      GFR est non-AA 52      Calcium 8.8 8.5 - 10.1 mg/dl    AST (SGOT) 47 (H) 15 - 37 U/L    ALT (SGPT) 31 12 - 78 U/L    Alk. phosphatase 98 45 - 117 U/L    Bilirubin, total 0.8 0.2 - 1.0 mg/dl    Protein, total 6.9 6.4 - 8.2 gm/dl    Albumin 3.1 (L) 3.4 - 5.0 gm/dl    Anion gap 9 5 - 15 mmol/L   LIPASE    Collection Time: 09/15/17  9:00 PM   Result Value Ref Range    Lipase 236 73 - 393 U/L   LACTIC ACID    Collection Time: 09/15/17  9:00 PM   Result Value Ref Range    Lactic Acid 4.8 (HH) 0.4 - 2.0 mmol/L   PROCALCITONIN    Collection Time: 09/15/17  9:00 PM   Result Value Ref Range    PROCALCITONIN 0.57 (H) 0.00 - 0.50 ng/ml   URINALYSIS W/ RFLX MICROSCOPIC    Collection Time: 09/15/17  9:20 PM   Result Value Ref Range    Color YELLOW YELLOW,STRAW      Appearance CLEAR CLEAR      Glucose 500 (A) NEGATIVE,Negative mg/dl    Bilirubin NEGATIVE NEGATIVE,Negative      Ketone NEGATIVE NEGATIVE,Negative mg/dl    Specific gravity 1.015 1.005 - 1.030      Blood NEGATIVE NEGATIVE,Negative      pH (UA) 5.5 5.0 - 9.0      Protein NEGATIVE NEGATIVE,Negative mg/dl    Urobilinogen 0.2 0.0 - 1.0 mg/dl    Nitrites NEGATIVE NEGATIVE,Negative      Leukocyte Esterase NEGATIVE NEGATIVE,Negative     INFLUENZA A/B, BY PCR    Collection Time: 09/15/17  9:45 PM   Result Value Ref Range    Influenza A PCR NEGATIVE NEGATIVE      Influenza B PCR NEGATIVE NEGATIVE         Imaging:    Xr Chest Pa Lat    Result Date: 09/15/2017   INDICATION: fever; prostate cancer.  Dyspnea TECHNIQUE: PA/lateral Chest Radiograph. COMPARISON: 05/24/2009     IMPRESSION: Patient is status post sternotomy. Heart size is upper normal. There  is bibasilar atelectatic change. No acute pulmonary infiltrate. Diffuse sclerotic osseous metastatic disease is seen throughout all the visualized bony structures.         EKG: tracing reviewed  independently      Rosalene Billings, MD  September 15, 2017

## 2017-09-15 NOTE — ED Notes (Signed)
TRANSFER - OUT REPORT:    Verbal report given to Kennyth Lose, RN(name) on [employer]  being transferred to PCU 14 (unit) for routine progression of care       Report consisted of patient???s Situation, Background, Assessment and   Recommendations(SBAR).     Information from the following report(s) SBAR was reviewed with the receiving nurse.    Lines:   Peripheral IV 09/15/17 Left Wrist (Active)   Site Assessment Clean, dry, & intact 09/15/2017  9:00 PM   Phlebitis Assessment 0 09/15/2017  9:00 PM   Infiltration Assessment 0 09/15/2017  9:00 PM   Dressing Status Clean, dry, & intact 09/15/2017  9:00 PM   Dressing Type Transparent 09/15/2017  9:00 PM   Hub Color/Line Status Patent;Flushed 09/15/2017  9:00 PM   Action Taken Blood drawn 09/15/2017  9:00 PM   Alcohol Cap Used Yes 09/15/2017  9:00 PM       Peripheral IV 09/15/17 Right Forearm (Active)   Site Assessment Clean, dry, & intact 09/15/2017  9:30 PM   Phlebitis Assessment 0 09/15/2017  9:30 PM   Infiltration Assessment 0 09/15/2017  9:30 PM   Dressing Status Clean, dry, & intact 09/15/2017  9:30 PM   Dressing Type Transparent 09/15/2017  9:30 PM   Hub Color/Line Status Pink;Patent;Flushed 09/15/2017  9:30 PM   Action Taken Blood drawn 09/15/2017  9:30 PM   Alcohol Cap Used Yes 09/15/2017  9:30 PM        Opportunity for questions and clarification was provided.      Patient transported with:   Registered Nurse

## 2017-09-15 NOTE — H&P (Addendum)
Medicine History and Physical  Patient: John Higgins Age: 76 y.o. Sex: male    Date of Birth: 1941-04-11 Admit Date: 09/15/2017 PCP: Anne Fu, MD   MRN: [id number]  CSN: [id number]         Assessment   Severe Sepsis with lactic acidosis   Uncontrolled type 2 diabetes mellitus with hyperglycemia ([place]) [E11.65]  Acute Renal insufficiency, baseline 1.0, today Cr 1.4  Lactic acidosis   CAD  DM, non insulin dependent, with hyperglycemia   GERD  HTN  Thyroid disease   Hx of Prostate cancer     Plan   Continue Vancomycin and Zosyn  IVF   Trend lactic acid  Follow blood, urine, sputum Cx  Trend Cr, avoid nephrotoxic medications  Glucommnader, hypoglycemia protocol   Diet NPO   DVT PPX SCD and Heparin   CODE STATUS FULL CODE         Chief Complaint:  Chief Complaint   Patient presents with   ??? Fever   ??? Headache         HPI:   John Higgins is a 76 y.o. year old male with hx of Prostate cancer with mets to bone who presents with fevers and chills, some nausea and vomiting, no cough, no diarrhea, pt has had similar episodes in past. Patient currently on treatment for prostate cancer and has had a procedure done to "remove blood cells 4 days ago and then they were infused today at his urologist office". Patient febrile and tachycardic, has elevated WBC 35, lactic acidosis and new renal insufficiency, he does however take prednisone, CXR without infiltrate, UA -ve for infection, Flu swab pending, started on broad spectrum ABX Vancomycin and Zosyn, panCx pending. Hematology/Onc consulted. Hematology/Oncology consulted.     Review of Systems - 12 Point ROS -ve except what is noted in the HPI.     Past Medical History:  Past Medical History:   Diagnosis Date   ??? CAD (coronary artery disease) 2005, 2015    CABG 2005, multiple stents, most recently in 05/2014   ??? Diabetes ([place])    ??? DM (diabetes mellitus screen)    ??? GERD (gastroesophageal reflux disease)    ??? HTN (hypertension)    ??? Liver disorder     ??? Prostate cancer ([place])     [id number] Prostate Cancer w/ bone mets   ??? Thyroid disease        Past Surgical History:  Past Surgical History:   Procedure Laterality Date   ??? CARDIAC SURG PROCEDURE UNLIST       cardiac bypass 2005 stent placement   ??? HX CATARACT REMOVAL Left 08/16/2015   ??? HX CHOLECYSTECTOMY  1974   ??? 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 HX SPLENECTOMY         Family History:  History reviewed. No pertinent family history.    Social History:  Social History     Socioeconomic History   ??? Marital status: DIVORCED     Spouse name: Not on file   ??? Number of children: Not on file   ??? Years of education: Not on file   ??? Highest education level: Not on file   Social Needs   ??? Financial resource strain: Not on file   ??? Food insecurity - worry: Not on file   ??? Food insecurity - inability: Not on file   ??? Transportation needs - medical: Not on file   ???  Transportation needs - non-medical: Not on file   Occupational History   ??? Not on file   Tobacco Use   ??? Smoking status: Former Smoker   ??? Smokeless tobacco: Never Used   Substance and Sexual Activity   ??? Alcohol use: No   ??? Drug use: No   ??? Sexual activity: Not on file   Other Topics Concern   ??? Not on file   Social History Narrative   ??? Not on file       Home Medications:  Prior to Admission medications    Medication Sig Start Date End Date Taking? Authorizing Provider   Sipuleucel-T in LR (PROVENGE) susp 250 mL by IntraVENous route once for 1 dose. 09/15/17 09/15/17  Given, Okey Regal, MD   abiraterone (ZYTIGA) 250 mg tab Take four tablets by mouth daily on an empty stomach.  Take one hour prior to food or two hours after food. Tablets should be swallowed whole with water.  Do not crush or chew tablets.  Indications: metastatic castration-resistant prostate cancer 07/16/17   Given, Okey Regal, MD   predniSONE (DELTASONE) 5 mg tablet Take 1 Tab by mouth two (2) times a day. 07/16/17   Given, Okey Regal, MD    metoprolol succinate (TOPROL-XL) 50 mg XL tablet  01/25/16   Provider, Historical   nitroglycerin (NITROSTAT) 0.4 mg SL tablet  04/12/16   Provider, Historical   isosorbide dinitrate (ISORDIL) 30 mg tablet Take 30 mg by mouth four (4) times daily.    Provider, Historical   gabapentin (NEURONTIN) 300 mg capsule  02/22/15   Provider, Historical   glimepiride (AMARYL) 1 mg tablet  04/04/15   Provider, Historical   simvastatin (ZOCOR) 40 mg tablet  02/22/15   Provider, Historical   tamsulosin (FLOMAX) 0.4 mg capsule  04/04/15   Provider, Historical   clopidogrel (PLAVIX) 75 mg tablet  04/04/15   Provider, Historical   METFORMIN HCL (METFORMIN PO) Take  by mouth.    Provider, Historical   LEVOTHYROXINE SODIUM (LEVOTHYROXINE PO) Take  by mouth.    Provider, Historical   losartan (COZAAR) 50 mg tablet Take  by mouth daily.    Provider, Historical   aspirin 81 mg tablet Take 81 mg by mouth.    Provider, Historical       Allergies:  Allergies   Allergen Reactions   ??? Naproxen Hives         Physical Exam:     Visit Vitals  BP 112/55   Pulse (!) 106   Temp (!) 100.7 ??F (38.2 ??C)   Resp 16   Ht _0  (1.753 m)   Wt 74.8 kg (165 lb)   SpO2 95%   BMI 24.37 kg/m??       Physical Exam:  General appearance: alert, cooperative, no distress, appears stated age  Head: Normocephalic, without obvious abnormality, atraumatic  Neck: supple, trachea midline  Lungs: clear to auscultation bilaterally  Heart: regular rate and rhythm, S1, S2 normal, no murmur, click, rub or gallop  Abdomen: soft, non-tender. Bowel sounds normal. No masses,  no organomegaly  Extremities: extremities normal, atraumatic, no cyanosis or edema  Skin: dry skin, dry MM  Neurologic: Grossly normal    Intake and Output:  Current Shift:  11/12 1901 - 11/13 0700  In: 1100 [I.V.:1100]  Out: -   Last three shifts:  No intake/output data recorded.    Lab/Data Reviewed:  Lab:   Recent Results (from the past 12 hour(s))  CBC WITH AUTOMATED DIFF    Collection Time: 09/15/17  9:00 PM    Result Value Ref Range    WBC 35.3 (H) 4.0 - 11.0 1000/mm3    RBC 4.32 3.80 - 5.70 M/uL    HGB 12.4 12.4 - 17.2 gm/dl    HCT 38.5 37.0 - 50.0 %    MCV 89.1 80.0 - 98.0 fL    MCH 28.7 23.0 - 34.6 pg    MCHC 32.2 30.0 - 36.0 gm/dl    PLATELET 361 140 - 450 1000/mm3    MPV 10.1 (H) 6.0 - 10.0 fL    RDW-SD 53.1 (H) 35.1 - 43.9      NRBC 0 0 - 0      IMMATURE GRANULOCYTES 1.1 0.0 - 3.0 %    NEUTROPHILS 84.9 (H) 34 - 64 %    LYMPHOCYTES 7.6 (L) 28 - 48 %    MONOCYTES 5.3 1 - 13 %    EOSINOPHILS 0.5 0 - 5 %    BASOPHILS 0.6 0 - 3 %   METABOLIC PANEL, COMPREHENSIVE    Collection Time: 09/15/17  9:00 PM   Result Value Ref Range    Sodium 133 (L) 136 - 145 mEq/L    Potassium 5.0 3.5 - 5.1 mEq/L    Chloride 101 98 - 107 mEq/L    CO2 23 21 - 32 mEq/L    Glucose 382 (H) 74 - 106 mg/dl    BUN 18 7 - 25 mg/dl    Creatinine 1.4 (H) 0.6 - 1.3 mg/dl    GFR est AA >60      GFR est non-AA 52      Calcium 8.8 8.5 - 10.1 mg/dl    AST (SGOT) 47 (H) 15 - 37 U/L    ALT (SGPT) 31 12 - 78 U/L    Alk. phosphatase 98 45 - 117 U/L    Bilirubin, total 0.8 0.2 - 1.0 mg/dl    Protein, total 6.9 6.4 - 8.2 gm/dl    Albumin 3.1 (L) 3.4 - 5.0 gm/dl    Anion gap 9 5 - 15 mmol/L   LIPASE    Collection Time: 09/15/17  9:00 PM   Result Value Ref Range    Lipase 236 73 - 393 U/L   LACTIC ACID    Collection Time: 09/15/17  9:00 PM   Result Value Ref Range    Lactic Acid 4.8 (HH) 0.4 - 2.0 mmol/L   PROCALCITONIN    Collection Time: 09/15/17  9:00 PM   Result Value Ref Range    PROCALCITONIN 0.57 (H) 0.00 - 0.50 ng/ml   URINALYSIS W/ RFLX MICROSCOPIC    Collection Time: 09/15/17  9:20 PM   Result Value Ref Range    Color YELLOW YELLOW,STRAW      Appearance CLEAR CLEAR      Glucose 500 (A) NEGATIVE,Negative mg/dl    Bilirubin NEGATIVE NEGATIVE,Negative      Ketone NEGATIVE NEGATIVE,Negative mg/dl    Specific gravity 1.015 1.005 - 1.030      Blood NEGATIVE NEGATIVE,Negative      pH (UA) 5.5 5.0 - 9.0      Protein NEGATIVE NEGATIVE,Negative mg/dl     Urobilinogen 0.2 0.0 - 1.0 mg/dl    Nitrites NEGATIVE NEGATIVE,Negative      Leukocyte Esterase NEGATIVE NEGATIVE,Negative     INFLUENZA A/B, BY PCR    Collection Time: 09/15/17  9:45 PM   Result Value Ref Range    Influenza A  PCR NEGATIVE NEGATIVE      Influenza B PCR NEGATIVE NEGATIVE         Imaging:    Xr Chest Pa Lat    Result Date: 09/15/2017  INDICATION: fever; prostate cancer.  Dyspnea TECHNIQUE: PA/lateral Chest Radiograph. COMPARISON: 05/24/2009     IMPRESSION: Patient is status post sternotomy. Heart size is upper normal. There is bibasilar atelectatic change. No acute pulmonary infiltrate. Diffuse sclerotic osseous metastatic disease is seen throughout all the visualized bony structures.       Rosalene Billings, MD  September 15, 2017

## 2017-09-15 NOTE — ED Notes (Signed)
Patient states that at [place] he started to have the chills, fever, and headache. He states that he has prostate cancer [facility] He states that he has it taken out on Friday and put back in on Monday. He states that this is the fifth or sixth time that he has gotten like this. He denies vomiting but is complaining of nausea. He denies diarrhea, cough, runny nose, or other symptoms.

## 2017-09-16 ENCOUNTER — Inpatient Hospital Stay
Admit: 2017-09-16 | Discharge: 2017-09-18 | Disposition: A | Discharge status: Home / Self Care | Payer: MEDICARE | Attending: Internal Medicine | Admitting: Internal Medicine

## 2017-09-16 LAB — URINALYSIS W/ RFLX MICROSCOPIC
Bilirubin: NEGATIVE
Blood: NEGATIVE
Glucose: 500 mg/dl — AB
Ketone: NEGATIVE mg/dl
Leukocyte Esterase: NEGATIVE
Nitrites: NEGATIVE
Protein: NEGATIVE mg/dl
Specific gravity: 1.015 (ref 1.005–1.030)
Urobilinogen: 0.2 mg/dl (ref 0.0–1.0)
pH (UA): 5.5 (ref 5.0–9.0)

## 2017-09-16 LAB — METABOLIC PANEL, COMPREHENSIVE
ALT (SGPT): 31 U/L (ref 12–78)
AST (SGOT): 47 U/L — ABNORMAL HIGH (ref 15–37)
Albumin: 3.1 gm/dl — ABNORMAL LOW (ref 3.4–5.0)
Alk. phosphatase: 98 U/L (ref 45–117)
Anion gap: 9 mmol/L (ref 5–15)
BUN: 18 mg/dl (ref 7–25)
Bilirubin, total: 0.8 mg/dl (ref 0.2–1.0)
CO2: 23 mEq/L (ref 21–32)
Calcium: 8.8 mg/dl (ref 8.5–10.1)
Chloride: 101 mEq/L (ref 98–107)
Creatinine: 1.4 mg/dl — ABNORMAL HIGH (ref 0.6–1.3)
GFR est AA: >60
GFR est non-AA: 52
Glucose: 382 mg/dl — ABNORMAL HIGH (ref 74–106)
Potassium: 5 mEq/L (ref 3.5–5.1)
Protein, total: 6.9 gm/dl (ref 6.4–8.2)
Sodium: 133 mEq/L — ABNORMAL LOW (ref 136–145)

## 2017-09-16 LAB — GLUCOSE, POC
Glucose (POC): 146 mg/dL — ABNORMAL HIGH (ref 65–105)
Glucose (POC): 201 mg/dL — ABNORMAL HIGH (ref 65–105)
Glucose (POC): 232 mg/dL — ABNORMAL HIGH (ref 65–105)
Glucose (POC): 259 mg/dL — ABNORMAL HIGH (ref 65–105)
Glucose (POC): 289 mg/dL — ABNORMAL HIGH (ref 65–105)

## 2017-09-16 LAB — EKG, 12 LEAD, INITIAL
Atrial Rate: 117 {beats}/min
Calculated P Axis: 28 degrees
Calculated R Axis: -50 degrees
Calculated T Axis: 48 degrees
P-R Interval: 180 ms
Q-T Interval: 308 ms
QRS Duration: 78 ms
QTC Calculation (Bezet): 429 ms
Ventricular Rate: 117 {beats}/min

## 2017-09-16 LAB — CBC WITH AUTOMATED DIFF
BASOPHILS: 0.6 % (ref 0–3)
EOSINOPHILS: 0.5 % (ref 0–5)
HCT: 38.5 % (ref 37.0–50.0)
HGB: 12.4 gm/dl (ref 12.4–17.2)
IMMATURE GRANULOCYTES: 1.1 % (ref 0.0–3.0)
LYMPHOCYTES: 7.6 % — ABNORMAL LOW (ref 28–48)
MCH: 28.7 pg (ref 23.0–34.6)
MCHC: 32.2 gm/dl (ref 30.0–36.0)
MCV: 89.1 fL (ref 80.0–98.0)
MONOCYTES: 5.3 % (ref 1–13)
MPV: 10.1 fL — ABNORMAL HIGH (ref 6.0–10.0)
NEUTROPHILS: 84.9 % — ABNORMAL HIGH (ref 34–64)
NRBC: 0 (ref 0–0)
PLATELET: 361 10*3/uL (ref 140–450)
RBC: 4.32 M/uL (ref 3.80–5.70)
RDW-SD: 53.1 — ABNORMAL HIGH (ref 35.1–43.9)
WBC: 35.3 10*3/uL — ABNORMAL HIGH (ref 4.0–11.0)

## 2017-09-16 LAB — LACTIC ACID
Lactic Acid: 4.8 mmol/L — CR (ref 0.4–2.0)
Lactic Acid: 2.3 mmol/L — ABNORMAL HIGH (ref 0.4–2.0)
Lactic Acid: 2 mmol/L (ref 0.4–2.0)
Lactic Acid: 2.1 mmol/L — ABNORMAL HIGH (ref 0.4–2.0)

## 2017-09-16 LAB — INFLUENZA A/B, BY PCR
Influenza A PCR: NEGATIVE
Influenza B PCR: NEGATIVE

## 2017-09-16 LAB — HEMOGLOBIN A1C W/O EAG: Hemoglobin A1c: 8.6 % — ABNORMAL HIGH (ref 4.2–6.3)

## 2017-09-16 LAB — LIPASE: Lipase: 236 U/L (ref 73–393)

## 2017-09-16 LAB — PROCALCITONIN: PROCALCITONIN: 0.57 ng/ml — ABNORMAL HIGH (ref 0.00–0.50)

## 2017-09-16 MED ORDER — SODIUM CHLORIDE 0.9 % IJ SYRG
Freq: Once | INTRAMUSCULAR | Status: AC
Start: 2017-09-16 — End: 2017-09-15
  Administered 2017-09-16: 02:00:00 via INTRAVENOUS

## 2017-09-16 MED ORDER — TRAMADOL 50 MG TAB
50 mg | Freq: Four times a day (QID) | ORAL | Status: DC | PRN
Start: 2017-09-16 — End: 2017-09-18

## 2017-09-16 MED ORDER — TAMSULOSIN SR 0.4 MG 24 HR CAP
0.4 mg | Freq: Every evening | ORAL | Status: DC
Start: 2017-09-16 — End: 2017-09-18
  Administered 2017-09-16 – 2017-09-17 (×2): via ORAL

## 2017-09-16 MED ORDER — SODIUM CHLORIDE 0.9% BOLUS IV
0.9 % | Freq: Once | INTRAVENOUS | Status: AC
Start: 2017-09-16 — End: 2017-09-15
  Administered 2017-09-16: 03:00:00 via INTRAVENOUS

## 2017-09-16 MED ORDER — SODIUM CHLORIDE 0.9 % IV
INTRAVENOUS | Status: DC
Start: 2017-09-16 — End: 2017-09-17
  Administered 2017-09-16 – 2017-09-17 (×2): via INTRAVENOUS

## 2017-09-16 MED ORDER — SODIUM CHLORIDE 0.9 % IV
10 gram | Freq: Once | INTRAVENOUS | Status: AC
Start: 2017-09-16 — End: 2017-09-16
  Administered 2017-09-16: 03:00:00 via INTRAVENOUS

## 2017-09-16 MED ORDER — VANCOMYCIN IN 0.9 % SODIUM CHLORIDE 1 GRAM/250 ML IV
1 gram/250 mL | Freq: Two times a day (BID) | INTRAVENOUS | Status: DC
Start: 2017-09-16 — End: 2017-09-17
  Administered 2017-09-16 – 2017-09-17 (×2): via INTRAVENOUS

## 2017-09-16 MED ORDER — GLUCAGON 1 MG INJECTION
1 mg | INTRAMUSCULAR | Status: DC | PRN
Start: 2017-09-16 — End: 2017-09-18

## 2017-09-16 MED ORDER — SIMVASTATIN 40 MG TAB
40 mg | Freq: Every evening | ORAL | Status: DC
Start: 2017-09-16 — End: 2017-09-18
  Administered 2017-09-17 – 2017-09-18 (×2): via ORAL

## 2017-09-16 MED ORDER — SODIUM CHLORIDE 0.9 % IV PIGGY BACK
3.375 gram | Freq: Three times a day (TID) | INTRAVENOUS | Status: DC
Start: 2017-09-16 — End: 2017-09-18
  Administered 2017-09-16 – 2017-09-18 (×7): via INTRAVENOUS

## 2017-09-16 MED ORDER — LEVOTHYROXINE 50 MCG TAB
50 mcg | ORAL | Status: DC
Start: 2017-09-16 — End: 2017-09-18
  Administered 2017-09-16 – 2017-09-18 (×3): via ORAL

## 2017-09-16 MED ORDER — SODIUM CHLORIDE 0.9% BOLUS IV
0.9 % | INTRAVENOUS | Status: AC
Start: 2017-09-16 — End: 2017-09-15
  Administered 2017-09-16: 02:00:00 via INTRAVENOUS

## 2017-09-16 MED ORDER — PREDNISONE 10 MG TAB
10 mg | Freq: Two times a day (BID) | ORAL | Status: DC
Start: 2017-09-16 — End: 2017-09-18
  Administered 2017-09-16 – 2017-09-18 (×5): via ORAL

## 2017-09-16 MED ORDER — PHARMACY VANCOMYCIN NOTE
Status: DC
Start: 2017-09-16 — End: 2017-09-18

## 2017-09-16 MED ORDER — SODIUM CHLORIDE 0.9 % IV PIGGY BACK
4.5 gram | Freq: Once | INTRAVENOUS | Status: AC
Start: 2017-09-16 — End: 2017-09-15
  Administered 2017-09-16: 03:00:00 via INTRAVENOUS

## 2017-09-16 MED ORDER — INSULIN GLARGINE 100 UNIT/ML INJECTION
100 unit/mL | Freq: Every evening | SUBCUTANEOUS | Status: DC
Start: 2017-09-16 — End: 2017-09-18
  Administered 2017-09-16 – 2017-09-18 (×3): via SUBCUTANEOUS

## 2017-09-16 MED ORDER — INSULIN LISPRO 100 UNIT/ML INJECTION
100 unit/mL | SUBCUTANEOUS | Status: DC | PRN
Start: 2017-09-16 — End: 2017-09-18
  Administered 2017-09-16 – 2017-09-18 (×3): via SUBCUTANEOUS

## 2017-09-16 MED ORDER — DEXTROSE 50% IN WATER (D50W) IV
INTRAVENOUS | Status: DC | PRN
Start: 2017-09-16 — End: 2017-09-18

## 2017-09-16 MED ORDER — CLOPIDOGREL 75 MG TAB
75 mg | Freq: Every day | ORAL | Status: DC
Start: 2017-09-16 — End: 2017-09-18
  Administered 2017-09-16 – 2017-09-18 (×4): via ORAL

## 2017-09-16 MED ORDER — INSULIN LISPRO 100 UNIT/ML INJECTION
100 unit/mL | Freq: Four times a day (QID) | SUBCUTANEOUS | Status: DC
Start: 2017-09-16 — End: 2017-09-18
  Administered 2017-09-16 – 2017-09-17 (×5): via SUBCUTANEOUS

## 2017-09-16 MED ORDER — PHARMACY VANCOMYCIN NOTE
Freq: Once | Status: AC
Start: 2017-09-16 — End: 2017-09-17
  Administered 2017-09-17: 14:00:00

## 2017-09-16 MED ORDER — ACETAMINOPHEN 325 MG TABLET
325 mg | ORAL | Status: AC
Start: 2017-09-16 — End: 2017-09-15
  Administered 2017-09-16: 03:00:00 via ORAL

## 2017-09-16 MED ORDER — ACETAMINOPHEN 325 MG TABLET
325 mg | ORAL | Status: DC | PRN
Start: 2017-09-16 — End: 2017-09-18

## 2017-09-16 MED FILL — ACETAMINOPHEN 325 MG TABLET: 325 mg | ORAL | Qty: 3

## 2017-09-16 MED FILL — LEVOTHYROXINE 25 MCG TAB: 25 mcg | ORAL | Qty: 1

## 2017-09-16 MED FILL — VANCOMYCIN IN 0.9 % SODIUM CHLORIDE 1 GRAM/250 ML IV: 1 gram/250 mL | INTRAVENOUS | Qty: 250

## 2017-09-16 MED FILL — PREDNISONE 10 MG TAB: 10 mg | ORAL | Qty: 1

## 2017-09-16 MED FILL — PIPERACILLIN-TAZOBACTAM 3.375 GRAM IV SOLR: 3.375 gram | INTRAVENOUS | Qty: 3.38

## 2017-09-16 MED FILL — TAMSULOSIN SR 0.4 MG 24 HR CAP: 0.4 mg | ORAL | Qty: 1

## 2017-09-16 MED FILL — VANCOMYCIN 10 GRAM IV SOLR: 10 gram | INTRAVENOUS | Qty: 1500

## 2017-09-16 MED FILL — PHARMACY VANCOMYCIN NOTE: Qty: 1

## 2017-09-16 MED FILL — PIPERACILLIN-TAZOBACTAM 4.5 GRAM IV SOLR: 4.5 gram | INTRAVENOUS | Qty: 4.5

## 2017-09-16 MED FILL — SODIUM CHLORIDE 0.9 % IV: INTRAVENOUS | Qty: 1000

## 2017-09-16 MED FILL — PREDNISONE 5 MG TAB: 5 mg | ORAL | Qty: 1

## 2017-09-16 MED FILL — CLOPIDOGREL 75 MG TAB: 75 mg | ORAL | Qty: 1

## 2017-09-16 NOTE — Consults (Addendum)
1. Sepsis, due to unspecified organism ([facility])    2. Uncontrolled type 2 diabetes mellitus with hyperglycemia ([facility])    3. 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 Followed by Dr. Gearldine Bienenstock   - Sepsis   - AKI with baseline creatinine 1.0   - Peak creatinine 1.4 (11/12)   - Current creatinine - not obtained today   - DM2  - Significant CAD history s/p CABG, multiple stents       PLAN:    - Discussed with Dr. Gearldine Bienenstock, concern for Provenge infusion itself being contaminated as patient does not have any ports / lines.   - Blood and urine cultures pending   - Appreciate hospitalist, infectious disease, and oncology's help in managing this patient   - Will continue to follow closely       Donia Pounds, PA-C  [facility] Monday-Friday 8am-5pm [phone]  If after hours please call [phone]      Addendum:  Patient seen and examined independently.  I have reviewed and agree with above assessment and plan.  Benign abdominal exam.  Denies voiding symptoms.  Recent CT 10/18 did not show obstructive uropathy.  On IV Vanco/Zosyn.  Supportive care.  Appreciate assistance from all consultants.    Nils Flack, MD, FACS  09/16/2017      Chief Complaint   Patient presents with   ??? Fever   ??? Headache       HISTORY OF PRESENT ILLNESS:  John [facility] is a 76 y.o. male who is seen in consultation as referred by Dr.Khalid Mehmood, MD for fevers with history of metastatic prostate cancer. He received Provenge dose number 3 yesterday at our office. He is followed by Dr. Gearldine Bienenstock for metastatic prostate cancer and is receiving Zytiga with Prednisone. He presented to [facility] yesterday for fevers, shaking chills, abdominal pain, nausea. Voiding well without dysuria, gross hematuria.     He has had no imaging this admission.     Dr. 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:              -Bone scan and CT scan on 04/2016 showed extensive osseous metastatic disease              - Initiated on Degerelix 05/14/16, most recent injection 05/28/17              -Started XGeva 06/11/16 (pt edentulous, so no dental clearance needed), most recent injection 05/28/17              -Enrolled in [employer] trial 06/21/16              -Bone scan and CT performed 12/12/16 - bone scan with improvement in bone mets              -Bone scan 03/11/17:  bone scan with stable number of bone mets, but increased activity in right superior pubic ramus, upper margin of the left SI joint, left intertrochanteric region, left scapula               - CT stable 03/11/17              -CT 05/28/17 stable              -  Bone scan 05/28/17 - There is interval development of a new metastatic focus within the right humeral head. There is also a new focus of increased        or tracer localization seen within the right hemisacrum near the SI joint suggesting additional metastatic disease. ??Since the previous study, there is      increasing radiotracer localization seen within the upper thoracic spine, right acetabulum, along the left SI joint, left superior pubic ramus and intertrochanteric region of the proximal left femur suggesting progression of metastatic disease.               Thoracic involvement documented as 15 foci, previously 40 foci in May.               -Now with [facility]              -Most recent PSA of 4.3 drawn 05/28/17. Previous PSA 1.74 drawn in May 2018             AUA Symptom Score 02/05/2012   Over the past month how often have you had the sensation that your bladder was not completely empty after you finished urinating? 1   Over the past month, how often have had to urinate again less than 2 hours after you last finished urinating? 2   Over the past month, how often have you found you stopped and started again several times when you urinated? 4    Over the past month, how often have you found it difficult to postpone urination? 0   Over the past month, how often have you had a weak urinary stream? 1   Over the past month, how often have you had to push or strain to begin urinating? 3   Over the past month, how many times did you most typically get up to urinate from the time you went to bed at night until the time you got up in the morning? 1   AUA Score 12   If you were to spend the rest of your life with your urinary condition the way it is now, how would you feel about that? Mixed-about equally satisfied         Past Medical History:   Diagnosis Date   ??? CAD (coronary artery disease) 2005, 2015    CABG 2005, multiple stents, most recently in 05/2014   ??? Diabetes ([place])    ??? DM (diabetes mellitus screen)    ??? GERD (gastroesophageal reflux disease)    ??? HTN (hypertension)    ??? Liver disorder    ??? Prostate cancer ([place])     [id number] Prostate Cancer w/ bone mets   ??? Thyroid disease        Past Surgical History:   Procedure Laterality Date   ??? CARDIAC SURG PROCEDURE UNLIST       cardiac bypass 2005 stent placement   ??? HX CATARACT REMOVAL Left 08/16/2015   ??? HX CHOLECYSTECTOMY  1974   ??? 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 HX SPLENECTOMY         Social History     Tobacco Use   ??? Smoking status: Former Smoker   ??? Smokeless tobacco: Never Used   Substance Use Topics   ??? Alcohol use: No   ??? Drug use: No       Allergies   Allergen Reactions   ??? Naproxen Hives  History reviewed. No pertinent family history.    Current Facility-Administered Medications   Medication Dose Route Frequency Provider Last Rate Last Dose   ??? dextrose (D50) infusion 25 g  25 g IntraVENous PRN Leatrice Jewels C, PA-C       ??? glucagon (GLUCAGEN) injection 1 mg  1 mg IntraMUSCular PRN Fuller Song, PA-C       ??? insulin glargine (LANTUS) injection 1-100 Units  1-100 Units SubCUTAneous QHS Hubbard, Julia C, PA-C        ??? insulin lispro (HUMALOG) injection   SubCUTAneous AC&HS Fuller Song, PA-C   Stopped at 09/16/17 0730   ??? insulin lispro (HUMALOG) injection 1-100 Units  1-100 Units SubCUTAneous PRN Fuller Song, PA-C       ??? vancomycin (VANCOCIN) 1000 mg in NS 250 ml infusion  1,000 mg IntraVENous Q12H Maylon Cos, MD 125 mL/hr at 09/16/17 1102 1,000 mg at 09/16/17 1102   ??? [START ON 09/17/2017] Vancomycin Trough Level Due   Other ONCE Maylon Cos, MD       ??? *Vancomycin: Pharmacy to Dose (r/o sepsis)  1 Each Other Rx Dosing/Monitoring Maylon Cos, MD       ??? 0.9% sodium chloride infusion  100 mL/hr IntraVENous CONTINUOUS Mehmood, Khalid, MD 100 mL/hr at 09/16/17 0939 100 mL/hr at 09/16/17 0939   ??? clopidogrel (PLAVIX) tablet 75 mg  75 mg Oral DAILY Mehmood, Khalid, MD   75 mg at 09/16/17 0932   ??? levothyroxine (SYNTHROID) tablet 25 mcg  25 mcg Oral Jackqulyn Livings, Khalid, MD   25 mcg at 09/16/17 0998   ??? predniSONE (DELTASONE) tablet 5 mg  5 mg Oral BID WITH MEALS Mehmood, Khalid, MD   5 mg at 09/16/17 3382   ??? simvastatin (ZOCOR) tablet 40 mg  40 mg Oral QHS Mehmood, Khalid, MD       ??? tamsulosin (FLOMAX) capsule 0.4 mg  0.4 mg Oral QPM Mehmood, Khalid, MD       ??? acetaminophen (TYLENOL) tablet 650 mg  650 mg Oral Q4H PRN Mehmood, Khalid, MD       ??? traMADol (ULTRAM) tablet 50 mg  50 mg Oral Q6H PRN Mehmood, Khalid, MD       ??? piperacillin-tazobactam (ZOSYN) 3.375 g in 0.9% sodium chloride (MBP/ADV) 100 mL MBP  3.375 g IntraVENous Q8H Maylon Cos, MD 25 mL/hr at 09/16/17 1249 3.375 g at 09/16/17 1249       Review of Systems  ROS is:    Negative for: Ophthalmologic issues, ENT issues, Cardiovascular issues, respiratory issues, GI issues, neurologic issues, hematoogic issues, skin lesions, musculoskeletal issues, psychiatric issues  Exceptions: yes    Positive for:    Fever,  Chills, nausea, abdominal pain              PHYSICAL EXAMINATION:   Visit Vitals  BP (!) 111/34   Pulse 84    Temp 99.7 ??F (37.6 ??C)   Resp 16   Ht 5\' 9"  (1.753 m)   Wt 78.9 kg (173 lb 15.1 oz)   SpO2 93%   BMI 25.69 kg/m??     Constitutional: Well developed, well nourished male.  No acute distress.    HEENT: Normocephalic, Atraumatic, EOM's intact   CV:  No peripheral edema noted   Respiratory: No respiratory distress or difficulties breathing   Abdomen:  Soft, non-tender, non-distended   GU Male:  No CVA tenderness   DRE: Deferred   Skin: No evidence  of jaundice.  Normal color  Neuro/Psych:  Alert and oriented. Affect appropriate.   MSK: Normal ROM       REVIEW OF LABS AND IMAGING:      Labs: Results:   Chemistry    Recent Labs     09/15/17  2100   GLU 382*   NA 133*   K 5.0   CL 101   CO2 23   BUN 18   CREA 1.4*   CA 8.8   AGAP 9   AP 98   TP 6.9   ALB 3.1*      CBC w/Diff Recent Labs     09/15/17  2100   WBC 35.3*   RBC 4.32   HGB 12.4   HCT 38.5   PLT 361   GRANS 84.9*   LYMPH 7.6*   EOS 0.5      Cultures No results for input(s): CULT in the last 72 hours.  All Micro Results     Procedure Component Value Units Date/Time    CULTURE, URINE [[id number]] Collected:  09/15/17 2120    Order Status:  Completed Specimen:  Urine Updated:  09/16/17 0846     Culture result No Growth To Date       CULTURE, BLOOD [[id number]] Collected:  09/15/17 2100    Order Status:  Completed Specimen:  Blood Updated:  09/16/17 0713     Blood Culture Result       Culture In Progress, Daily Updates To Follow          CULTURE, BLOOD [[id number]] Collected:  09/15/17 2130    Order Status:  Completed Specimen:  Blood Updated:  09/16/17 0713     Blood Culture Result       Culture In Progress, Daily Updates To Follow          CULTURE, BLOOD [[id number]] Collected:  09/15/17 2030    Order Status:  Canceled Specimen:  Blood     CULTURE, BLOOD [[id number]] Collected:  09/15/17 2030    Order Status:  Canceled Specimen:  Blood             Urinalysis Color   Date Value Ref Range Status   09/15/2017 YELLOW YELLOW,STRAW   Final     Appearance    Date Value Ref Range Status   09/15/2017 CLEAR CLEAR   Final     Specific gravity   Date Value Ref Range Status   09/15/2017 1.015 1.005 - 1.030   Final     pH (UA)   Date Value Ref Range Status   09/15/2017 5.5 5.0 - 9.0   Final     Protein   Date Value Ref Range Status   09/15/2017 NEGATIVE NEGATIVE,Negative mg/dl Final     Ketone   Date Value Ref Range Status   09/15/2017 NEGATIVE NEGATIVE,Negative mg/dl Final     Bilirubin   Date Value Ref Range Status   09/15/2017 NEGATIVE NEGATIVE,Negative   Final     Blood   Date Value Ref Range Status   09/15/2017 NEGATIVE NEGATIVE,Negative   Final     Urobilinogen   Date Value Ref Range Status   09/15/2017 0.2 0.0 - 1.0 mg/dl Final     Nitrites   Date Value Ref Range Status   09/15/2017 NEGATIVE NEGATIVE,Negative   Final     Leukocyte Esterase   Date Value Ref Range Status   09/15/2017 NEGATIVE NEGATIVE,Negative   Final     Potassium   Date Value Ref  Range Status   09/15/2017 5.0 3.5 - 5.1 mEq/L Final     Comment:     ANALYZER RESULT IS 5.0. RESULT MAY BE FALSELY INCREASED DUE TO HEMOLYSIS.     Creatinine   Date Value Ref Range Status   09/15/2017 1.4 (H) 0.6 - 1.3 mg/dl Final     BUN   Date Value Ref Range Status   09/15/2017 18 7 - 25 mg/dl Final     Prostate Specific Ag   Date Value Ref Range Status   07/16/2017 10.02  Final      PSA No results for input(s): PSA in the last 72 hours.   Coagulation No results found for: PTP, INR, APTT          No GU imaging this admission

## 2017-09-16 NOTE — Progress Notes (Signed)
Medicine Progress Note    Date of Admission: 09/15/2017  Length of Stay: 1    Assessment And Plan     Severe Sepsis with lactic acidosis   Uncontrolled type 2 diabetes mellitus with hyperglycemia (HCC) [E11.65]  Acute Renal insufficiency, baseline 1.0, today Cr 1.4  Lactic acidosis   CAD  DM, non insulin dependent, with hyperglycemia   GERD  HTN  Thyroid disease   Hx of Prostate cancer     Treatment plan  Patient admitted to hospital with sepsis.  Likely source of sepsis is contaminated transfusion products during immunotherapy .  The patient IV vancomycin and Zosyn.  Follow-up on blood culture.  Chest x-ray and UA reviewed.  No infection.  Case discussed in detail with infectious disease specialist.  Case also discussed with urology  Case discussed with Dr. Edd Arbour oncologist he recommends patient gets treatment with urology.  They have nothing to add at this time.  Will repeat CBC  If patient keeps spiking fever will need antifungal  Keep patient IV fluids  Insulin Lantus along with sliding scale    Unit floortime 35 minutes, more than half time spent in counseling and coordination of care        Discussed with patient. Updates regarding diagnose, tests results, prognosis, treatment  of the patient's medical conditions discussed in detail     Subjective:     Complaining of pain in right iliac crest due to metastatic disease.  Just feeling weak.  Patient still has some chills.  No vomiting.  Denies diarrhea.  No cough.     Objective:     Patient Vitals for the past 12 hrs:   Temp Pulse Resp BP SpO2   09/16/17 1235 ??? 84 16 (!) 111/34 93 %   09/16/17 1202 99.7 ??F (37.6 ??C) 89 17 (!) 111/34 92 %   09/16/17 1002 ??? 83 24 (!) 127/37 93 %   09/16/17 0901 ??? 80 21 133/51 93 %   09/16/17 0801 (!) 101.9 ??F (38.8 ??C) 85 17 147/56 94 %   09/16/17 0738 (!) 101.9 ??F (38.8 ??C) ??? ??? ??? ???   09/16/17 0401 99 ??F (37.2 ??C) 74 11 111/50 97 %   09/16/17 0301 ??? 78 12 103/51 95 %         Intake/Output Summary (Last 24 hours) at 09/16/2017 1418   Last data filed at 09/16/2017 0738  Gross per 24 hour   Intake 1100 ml   Output 1225 ml   Net -125 ml     Constitutional:??Awake and alert, NAD  HENT:??atraumatic, normocephalic, oropharynx clear ??  Eyes:??????conjunctiva normal  Neck:??????supple and trachea normal  Cardiovascular:?? Regular rate and rhythm, heart sounds normal, intact distal pulses  Pulmonary/Chest Wall: breath sounds normal and effort normal  Abdominal:????????????appearance normal, soft, non-tender upon palpation,bowel sounds normal.  Neurological:??????awake, alert and oriented, CN intact, generalized weakness  Extremities:???? No clubbing or cyanosis. Pedal pulses 2+ b/l.  Capillary refill normal.Skin intact      Recent Results (from the past 24 hour(s))   CBC WITH AUTOMATED DIFF    Collection Time: 09/15/17  9:00 PM   Result Value Ref Range    WBC 35.3 (H) 4.0 - 11.0 1000/mm3    RBC 4.32 3.80 - 5.70 M/uL    HGB 12.4 12.4 - 17.2 gm/dl    HCT 38.5 37.0 - 50.0 %    MCV 89.1 80.0 - 98.0 fL    MCH 28.7 23.0 - 34.6 pg    MCHC  32.2 30.0 - 36.0 gm/dl    PLATELET 361 140 - 450 1000/mm3    MPV 10.1 (H) 6.0 - 10.0 fL    RDW-SD 53.1 (H) 35.1 - 43.9      NRBC 0 0 - 0      IMMATURE GRANULOCYTES 1.1 0.0 - 3.0 %    NEUTROPHILS 84.9 (H) 34 - 64 %    LYMPHOCYTES 7.6 (L) 28 - 48 %    MONOCYTES 5.3 1 - 13 %    EOSINOPHILS 0.5 0 - 5 %    BASOPHILS 0.6 0 - 3 %   METABOLIC PANEL, COMPREHENSIVE    Collection Time: 09/15/17  9:00 PM   Result Value Ref Range    Sodium 133 (L) 136 - 145 mEq/L    Potassium 5.0 3.5 - 5.1 mEq/L    Chloride 101 98 - 107 mEq/L    CO2 23 21 - 32 mEq/L    Glucose 382 (H) 74 - 106 mg/dl    BUN 18 7 - 25 mg/dl    Creatinine 1.4 (H) 0.6 - 1.3 mg/dl    GFR est AA >60      GFR est non-AA 52      Calcium 8.8 8.5 - 10.1 mg/dl    AST (SGOT) 47 (H) 15 - 37 U/L    ALT (SGPT) 31 12 - 78 U/L    Alk. phosphatase 98 45 - 117 U/L    Bilirubin, total 0.8 0.2 - 1.0 mg/dl    Protein, total 6.9 6.4 - 8.2 gm/dl    Albumin 3.1 (L) 3.4 - 5.0 gm/dl    Anion gap 9 5 - 15 mmol/L    LIPASE    Collection Time: 09/15/17  9:00 PM   Result Value Ref Range    Lipase 236 73 - 393 U/L   LACTIC ACID    Collection Time: 09/15/17  9:00 PM   Result Value Ref Range    Lactic Acid 4.8 (HH) 0.4 - 2.0 mmol/L   CULTURE, BLOOD    Collection Time: 09/15/17  9:00 PM   Result Value Ref Range    Blood Culture Result Culture In Progress, Daily Updates To Follow     PROCALCITONIN    Collection Time: 09/15/17  9:00 PM   Result Value Ref Range    PROCALCITONIN 0.57 (H) 0.00 - 0.50 ng/ml   EKG, 12 LEAD, INITIAL    Collection Time: 09/15/17  9:08 PM   Result Value Ref Range    Ventricular Rate 117 BPM    Atrial Rate 117 BPM    P-R Interval 180 ms    QRS Duration 78 ms    Q-T Interval 308 ms    QTC Calculation (Bezet) 429 ms    Calculated P Axis 28 degrees    Calculated R Axis -50 degrees    Calculated T Axis 48 degrees    Diagnosis       Sinus tachycardia  Left anterior fascicular block  Poor R Wave Progression  Abnormal ECG  No previous ECGs available  Confirmed by Alimard, M.D., Ramin ([id number]) on 09/16/2017 9:34:29 AM     URINALYSIS W/ RFLX MICROSCOPIC    Collection Time: 09/15/17  9:20 PM   Result Value Ref Range    Color YELLOW YELLOW,STRAW      Appearance CLEAR CLEAR      Glucose 500 (A) NEGATIVE,Negative mg/dl    Bilirubin NEGATIVE NEGATIVE,Negative      Ketone NEGATIVE NEGATIVE,Negative mg/dl  Specific gravity 1.015 1.005 - 1.030      Blood NEGATIVE NEGATIVE,Negative      pH (UA) 5.5 5.0 - 9.0      Protein NEGATIVE NEGATIVE,Negative mg/dl    Urobilinogen 0.2 0.0 - 1.0 mg/dl    Nitrites NEGATIVE NEGATIVE,Negative      Leukocyte Esterase NEGATIVE NEGATIVE,Negative     CULTURE, URINE    Collection Time: 09/15/17  9:20 PM   Result Value Ref Range    Culture result No Growth To Date     CULTURE, BLOOD    Collection Time: 09/15/17  9:30 PM   Result Value Ref Range    Blood Culture Result Culture In Progress, Daily Updates To Follow     INFLUENZA A/B, BY PCR    Collection Time: 09/15/17  9:45 PM   Result Value Ref Range     Influenza A PCR NEGATIVE NEGATIVE      Influenza B PCR NEGATIVE NEGATIVE     GLUCOSE, POC    Collection Time: 09/15/17 11:42 PM   Result Value Ref Range    Glucose (POC) 232 (H) 65 - 105 mg/dL   LACTIC ACID    Collection Time: 09/16/17 12:49 AM   Result Value Ref Range    Lactic Acid 2.3 (H) 0.4 - 2.0 mmol/L   LACTIC ACID    Collection Time: 09/16/17  4:53 AM   Result Value Ref Range    Lactic Acid 2.0 0.4 - 2.0 mmol/L   HEMOGLOBIN A1C W/O EAG    Collection Time: 09/16/17  4:53 AM   Result Value Ref Range    Hemoglobin A1c 8.6 (H) 4.2 - 6.3 %   GLUCOSE, POC    Collection Time: 09/16/17  8:50 AM   Result Value Ref Range    Glucose (POC) 146 (H) 65 - 105 mg/dL   LACTIC ACID    Collection Time: 09/16/17  9:25 AM   Result Value Ref Range    Lactic Acid 2.1 (H) 0.4 - 2.0 mmol/L   GLUCOSE, POC    Collection Time: 09/16/17 12:18 PM   Result Value Ref Range    Glucose (POC) 201 (H) 65 - 105 mg/dL       All Micro Results     Procedure Component Value Units Date/Time    CULTURE, URINE [[id number]] Collected:  09/15/17 2120    Order Status:  Completed Specimen:  Urine Updated:  09/16/17 0846     Culture result No Growth To Date       CULTURE, BLOOD [[id number]] Collected:  09/15/17 2100    Order Status:  Completed Specimen:  Blood Updated:  09/16/17 0713     Blood Culture Result       Culture In Progress, Daily Updates To Follow          CULTURE, BLOOD [[id number]] Collected:  09/15/17 2130    Order Status:  Completed Specimen:  Blood Updated:  09/16/17 0713     Blood Culture Result       Culture In Progress, Daily Updates To Follow          CULTURE, BLOOD [[id number]] Collected:  09/15/17 2030    Order Status:  Canceled Specimen:  Blood     CULTURE, BLOOD [[id number]] Collected:  09/15/17 2030    Order Status:  Canceled Specimen:  Blood           Imaging:    Nm Bone Scan Wh Body    Result Date: 08/26/2017  SEND REPORT TO: Herbie Casco  GIVEN, MD [address] [place] [address] FAX:  [phone] Name:  JABIR DAHLEM DOB:  [birth year]  Gender:  Male    Exam Date:  August 26, 2017 Referring Provider: Daylene Katayama MD NUCLEAR MEDICINE WHOLE BODY BONE SCINTIGRAPHY HISTORY: 76 year old male SYMPTOMS: Prostate cancer REFERRING DIAGNOSIS: Evaluate metastatic disease RADIOPHARMACEUTICAL: Tc-40mMDP 26.3 mCi IV POST-INJECTION IMAGING DELAY: 3 hr COMPARISON: Comparison made to the previous whole-body bone scan of 05/29/2017. FINDINGS: BONE LESIONS: Numerous osseous metastatic foci are again noted.  There is a focus of increased radiotracer localization seen within the proximal shaft of the humerus suggesting metastatic disease, new since the previous study.  A focus of increased radiotracer localization seen within the L1 vertebral body is also new.  A subtle focus of increased radiotracer localization seen within the intertrochanteric region of the proximal right femur is new since the previous study.  There is interval increase in radiotracer localization seen within multiple foci previously noted including the left intertrochanteric region, right acetabulum, left superior pubic ramus, several bilateral rib lesions, several lesions within the thoracic spine, pelvic and SI joint regions bilaterally, right humeral head, left clavicle and xyphoid process suggesting progression of osseous metastatic disease.  A small focus of increased radiotracer localization is seen along a right anterior mid to lower rib.  This is nonspecific although commonly reflects post-traumatic phenomenon.               SKULL: There are approximately zero metastatic foci, as described above.               THORAX: There are approximately 15 metastatic foci, as described above.               SPINE: There are approximately 41 metastatic foci, as described above.               PELVIS: There are approximately 13 metastatic foci, as described above.                EXTREMITIES: There are approximately 13 metastatic foci, as described above. SOFT TISSUE CONTOURS:  Normal. 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 OTHER FINDINGS: Radiotracer localization suggesting degenerative changes again noted within the shoulders, knees and feet/ankles bilaterally. IMPRESSION: Please see discussion above. Thank you for this referral.     Interpreting Radiologist:  AOctavia HeirM.D.                             Electronically signed: AOctavia Heir 08/27/2017 Name:  DROYE GUSTAFSON                            NM Bone Study                            Exam Date:  August 26, 2017 ~~This report was copied and pasted from the servicing EHR system.~~     Xr Chest Pa Lat    Result Date: 09/15/2017  INDICATION: fever; prostate cancer.  Dyspnea TECHNIQUE: PA/lateral Chest Radiograph. COMPARISON: 05/24/2009     IMPRESSION: Patient is status post sternotomy. Heart size is upper normal. There is bibasilar atelectatic change. No acute pulmonary infiltrate. Diffuse sclerotic osseous metastatic disease is seen throughout all the visualized bony structures.     Ct Abd Pelv W  Wo Cont    SEND REPORT TO: Daylene Katayama, MD [place] [address] FAX:  [phone] Name:  EMMET MESSER DOB:  [birth year]  Gender:  Male    Exam Date:  August 26, 2017 Referring Provider: Daylene Katayama MD CT STUDY OF THE ABDOMEN AND PELVIS HISTORY: 51-year all man SYMPTOMS: Prostate cancer REFERRING DIAGNOSIS: Malignant neoplasm of prostate TECHNIQUE: Helical multislice.  All MRI \\T\\amp; CT Diagnostics CT scans are performed using one of these three dose reduction techniques: automated exposure control, adjustment of the mA and/or kV according to patient size, or use of iterative reconstruction techniques. ISTAT IN HOUSE: 08/26/17 REASON:               AGE, HTN, DM               CREAT:  0.9   eGFR: 74.8              CONTRAST DECISION:  100cc IV Isovue COMPARISON:  CT May 29, 2017 FINDINGS:  Lung bases:  Within normal limits. CABG surgery. Liver: Within normal limits. Biliary tree: Within normal limits. Gallbladder: Surgically absent. Spleen: Within normal limits. Pancreas: Distal pancreatectomy. Adrenals: Within normal limits. Kidneys/ureters: There is an approximate 4.4 cm exophytic simple cyst projecting posteriorly from the mid left kidney. There is a 1.6 cm cyst on the anteromedial lower left kidney. There is a 2.8 cm simple cyst projecting laterally from the lower right kidney. Bowel: Small sliding hiatal hernia. No bowel obstruction or inflammation is seen. The appendix is seen in the central abdomen mildly extending to the left side and appears normal. Aorta/vasculature: The aorta shows atherosclerotic calcification without significant aneurysm. Pelvis: Within normal limits. Miscellaneous: No pathologically enlarged lymph nodes or ascites is seen. Bilateral inguinal fat-containing hernias, larger on the left side.. 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 There is soft tissue attenuation in the pelvic ventral subcutaneous fat, more so on the left side. Measureable lymph nodes per RECIST 1.1: NONE 1. location, bi-diameter measurement 2. location, bi-diameter measurement 3. location, bi-diameter measurement 4. location, bi-diameter measurement NEW 1 location, bi-diameter measurement NEW 2 location, bi-diameter measurement IMPRESSION: Please see above. No significant change. Thank you for this referral.   Interpreting Radiologist:  Dayton Martes M.D.                             Electronically signed: Dayton Martes  08/29/2017 Name:  [name]                             CT Abd/Pelv                            Exam Date:  August 26, 2017 ~~This report was copied and pasted from the servicing EHR system.~~           Echo Results  (Last 48 hours)    None           Current Facility-Administered Medications   Medication Dose Route Frequency   ??? dextrose (D50) infusion 25 g  25 g IntraVENous PRN   ??? glucagon (GLUCAGEN) injection 1 mg  1 mg IntraMUSCular PRN   ??? insulin glargine (LANTUS) injection 1-100 Units  1-100 Units SubCUTAneous QHS   ??? insulin lispro (HUMALOG) injection   SubCUTAneous  AC&HS   ??? insulin lispro (HUMALOG) injection 1-100 Units  1-100 Units SubCUTAneous PRN   ??? vancomycin (VANCOCIN) 1000 mg in NS 250 ml infusion  1,000 mg IntraVENous Q12H   ??? [START ON 09/17/2017] Vancomycin Trough Level Due   Other ONCE   ??? *Vancomycin: Pharmacy to Dose (r/o sepsis)  1 Each Other Rx Dosing/Monitoring   ??? 0.9% sodium chloride infusion  100 mL/hr IntraVENous CONTINUOUS   ??? clopidogrel (PLAVIX) tablet 75 mg  75 mg Oral DAILY   ??? levothyroxine (SYNTHROID) tablet 25 mcg  25 mcg Oral 6am   ??? predniSONE (DELTASONE) tablet 5 mg  5 mg Oral BID WITH MEALS   ??? simvastatin (ZOCOR) tablet 40 mg  40 mg Oral QHS   ??? tamsulosin (FLOMAX) capsule 0.4 mg  0.4 mg Oral QPM   ??? acetaminophen (TYLENOL) tablet 650 mg  650 mg Oral Q4H PRN   ??? traMADol (ULTRAM) tablet 50 mg  50 mg Oral Q6H PRN   ??? piperacillin-tazobactam (ZOSYN) 3.375 g in 0.9% sodium chloride (MBP/ADV) 100 mL MBP  3.375 g IntraVENous Q8H            Dragon medical dictation software was used for portions of this report. Unintended errors may occur.   Clearnce Sorrel, MD  09/16/2017 2:18 PM

## 2017-09-16 NOTE — Progress Notes (Signed)
[facility] Dosing Services: Vancomycin      Consult for Vancomycin Dosing by Pharmacy by Dr. Emeterio Reeve  Consult provided for this 76 y.o. year old male , for indication of r/o sepsis.  Day of Therapy :new start    Ht Readings from Last 1 Encounters:   09/16/17 175.3 cm (69")        Wt Readings from Last 1 Encounters:   09/16/17 78.9 kg (173 lb 15.1 oz)        Previous Regimen    Last Level    Other Current Antibiotics zosyn   Significant Cultures    Serum Creatinine Lab Results   Component Value Date/Time    Creatinine 1.4 (H) 09/15/2017 09:00 PM      Creatinine Clearance Estimated Creatinine Clearance: 43.5 mL/min (A) (based on SCr of 1.4 mg/dL (H)).   BUN Lab Results   Component Value Date/Time    BUN 18 09/15/2017 09:00 PM      WBC Lab Results   Component Value Date/Time    WBC 35.3 (H) 09/15/2017 09:00 PM      H/H Lab Results   Component Value Date/Time    HGB 12.4 09/15/2017 09:00 PM      Platelets Lab Results   Component Value Date/Time    PLATELET 361 09/15/2017 09:00 PM      Temp 99.2 ??F (37.3 ??C)     Start Vancomycin therapy, with loading dose of 1500 mg IV x 1.    Follow with maintenance dose of 1000 mg IV q12h.    Dose calculated to approximate a therapeutic trough of  15-20 mcg/mL.      Pharmacy to follow daily and will make changes to dose and/or frequency based on clinical status.    Pharmacist Darryll Capers, [place]

## 2017-09-16 NOTE — Progress Notes (Signed)
Problem: Falls - Risk of  Goal: *Absence of Falls  Document Schmid Fall Risk and appropriate interventions in the flowsheet.  Outcome: Progressing Towards Goal  Fall Risk Interventions:  Mobility Interventions: Bed/chair exit alarm, Patient to call before getting OOB              Elimination Interventions: Call light in reach, Bed/chair exit alarm, Urinal in reach

## 2017-09-16 NOTE — Progress Notes (Signed)
Problem: Falls - Risk of  Goal: *Absence of Falls  Document Schmid Fall Risk and appropriate interventions in the flowsheet.  Outcome: Progressing Towards Goal  Fall Risk Interventions:  Mobility Interventions: Bed/chair exit alarm, Patient to call before getting OOB              Elimination Interventions: Bed/chair exit alarm, Call light in reach, Patient to call for help with toileting needs, Toilet paper/wipes in reach, Toileting schedule/hourly rounds, Urinal in reach

## 2017-09-16 NOTE — Other (Signed)
TRANSFER - IN REPORT:    Verbal report received from Chaney Born (name) on [facility] Higgins  being received from [place] [facility] (unit) for routine progression of care      Report consisted of patient???s Situation, Background, Assessment and   Recommendations(SBAR).     Information from the following report(s) SBAR, Kardex and MAR was reviewed with the receiving nurse.    Opportunity for questions and clarification was provided.      Assessment completed upon patient???s arrival to unit and care assumed.

## 2017-09-16 NOTE — Progress Notes (Signed)
DISCHARGE PLAN:  Home with family assistance.  No additional DME anticipated.  Family/friend will transport the patient home    Patient admitted on 09/15/2017 from home with   Chief Complaint   Patient presents with   ??? Fever   ??? Headache        The patient has been admitted to the hospital 1 times in the past 12 months.    Previous 4 Admission Dates Admission and Discharge Diagnosis Interventions Barriers Disposition                                   Tentative dc plan:    Home    Facility if plan     Anticipated Discharge Date:   09/18/2017    PCP: Anne Fu, MD     Specialists:          Face sheet information, address, contact info and insurance verified  Y    Dialysis Unit/ chair time / access:         Pharmacy:     [facility]  Any issues getting medications N    DME at home and provider:    cane    O2 at home   Provider     Home Environment:    Lives at [address]  [place] [address]    @HOMEPHONE @.     Prior to admission open services:     None    [place]-         Extended Emergency Contact Information  Primary Emergency Contact: [place] Phone: [phone]  Relation: None  Secondary Emergency Contact: [place] Phone: [phone]  Mobile Phone: [phone]  Relation: Daughter      Transportation:     Family/friend will transport home    Therapy Recommendations:  OT :y/n        PT :y/n        SLP :y/n         RT Home O2 Evaluation :y/n          Wound Care: y/n          Change consult (formerly [facility]) :         Case Management Assessment    ABUSE/NEGLECT SCREENING   Physical Abuse/Neglect: Denies   Sexual Abuse: Denies   Sexual Abuse: Denies   Other Abuse/Issues: Denies          PRIMARY DECISION MAKER                                   CARE MANAGEMENT INTERVENTIONS   Readmission Interview Completed: Not Applicable   PCP Verified by CM: Shaune Spittle)    Last Visit to PCP: (one month ago)   Palliative Care Criteria Met (RRAT>21 & CHF Dx)?: No   Mode of Transport at Discharge: Other (see comment)(Friend, Aquilla Hacker)       Transition of Care Consult (CM Consult): Discharge Planning           MyChart Signup: Yes   Discharge Durable Medical Equipment: No(cane in the home, none anticipated at discharge)   Physical Therapy Consult: No   Occupational Therapy Consult: No   Speech  Therapy Consult: No   Current Support Network: Own Home(lives with a friend)   Reason for Referral: DCP Rounds   History Provided By: Patient   Patient Orientation: Alert and Oriented, Person, Place, Situation, Self   Cognition: Alert   Support System Response: Unavailable   Previous Living Arrangement: Lives with Family Independent   Home Accessibility: Multi Level Home(2 story home)   Prior Functional Level: Independent in ADLs/IADLs   Current Functional Level: Independent in ADLs/IADLs   Primary Language: English   Can patient return to prior living arrangement: Yes   Ability to make needs known:: Good   Family able to assist with home care needs:: Yes   Pets: Dog   Needs help with pets while hospitalized: No       Types of Needs Identified: Disease Management Education       Confirm Follow Up Transport: Self   Confirm Transport and Arrange: No   Plan discussed with Pt/Family/Caregiver: Yes   Freedom of Choice Offered: Yes      DISCHARGE LOCATION   Discharge Placement: Home with family assistance

## 2017-09-16 NOTE — Other (Signed)
TRANSFER - OUT REPORT:    Verbal report given to Christine RN(name) on [employer]  being transferred to [facility](unit) for routine progression of care       Report consisted of patient???s Situation, Background, Assessment and   Recommendations(SBAR).     Information from the following report(s) SBAR, Kardex, Recent Results and Cardiac Rhythm SR was reviewed with the receiving nurse.    Lines:   Peripheral IV 09/15/17 Left Wrist (Active)   Site Assessment Clean, dry, & intact 09/15/2017  9:00 PM   Phlebitis Assessment 0 09/15/2017  9:00 PM   Infiltration Assessment 0 09/15/2017  9:00 PM   Dressing Status Clean, dry, & intact 09/15/2017  9:00 PM   Dressing Type Transparent 09/15/2017  9:00 PM   Hub Color/Line Status Patent;Flushed 09/15/2017  9:00 PM   Action Taken Blood drawn 09/15/2017  9:00 PM   Alcohol Cap Used Yes 09/15/2017  9:00 PM       Peripheral IV 09/15/17 Right Forearm (Active)   Site Assessment Clean, dry, & intact 09/15/2017  9:30 PM   Phlebitis Assessment 0 09/15/2017  9:30 PM   Infiltration Assessment 0 09/15/2017  9:30 PM   Dressing Status Clean, dry, & intact 09/15/2017  9:30 PM   Dressing Type Transparent 09/15/2017  9:30 PM   Hub Color/Line Status Pink;Patent;Flushed 09/15/2017  9:30 PM   Action Taken Blood drawn 09/15/2017  9:30 PM   Alcohol Cap Used Yes 09/15/2017  9:30 PM        Opportunity for questions and clarification was provided.      Patient transported with:   Patient-specific medications from Pharmacy

## 2017-09-16 NOTE — Other (Signed)
Bedside and Verbal shift change report given to Galvin Proffer   (oncoming nurse) by Altha Harm, RN (offgoing nurse). Report included the following information SBAR, Kardex and MAR.

## 2017-09-16 NOTE — Consults (Signed)
INFECTIOUS DISEASE CONSULT NOTE       Requested by: Dr Dory Peru    Reason for consult: Fever, sepsis    Date of admission: 09/15/2017    Date of consult: September 16, 2017      ABX:     Current abx Prior abx    Vanco/Zosyn 11/12-1      ASSESSMENT: -- RECOMMENDATIONS      Sepsis POA  - pt with fever, sig leucocytosis, elevated lactic acid and procalcitonin and elevated Cr  - source unclear but high risk for bacteremia given recent transfusion  - no sick contact, no recent travel,  - Agree with BSAbx with Vancoa nd Zsoyn  - pt doesn;t have any lines or ports and without foley cath  - CXR without any infiltrates  - no localizing signs  - continue ICU care  - modify Abx based on cx results  - d/w Dr Dory Peru   Prostate cancer- diagnosed in 2017  - on monthly shots ?Xegva/Degarelix  - also gets monthly transfusions of irradiated cells  - on prednisone 109m/day  - follows with GU - recent scan with progression of dis and mets  - per Onc and GU  - immunosuppressed   DM2 uncontrolled - needs good control  - counseled   AKI- suspect sec to dehydration - monitor renal function  - Dose abx for current CrCl  - avoid nephrotoxic meds   Co-morb- HTN, GERD, CAD, thyroid dis              MICROBIOLOGY:     11/12 Blcx x2 IP   Ucx IP   Influenza neg    LINES/DRAINS:     PIV    HPI:     John Higgins a 76y.o. year old male with PMH of Prostate cancer with mets to bone who presented to ED 11/12 with c/o fevers and chills. He says gets frequent fevers and chills for last 2 months but this was worse. He also had nausea and vomiting but no cough, diarrhea. He is on treatment for prostate cancer and has had a procedure done to "remove blood cells" 4 days ago and then they were infused back at his urologist office" on day of presentation.Pt was asked to go to ED. In ED, he was febrile and tachycardic. His WBC was elevated at 35k with increase in lactic acid, cr  and procalcitonin. CXR was without infiltrate and UA was negative. His influenza was negative. He was admitted in PCU and started on Bsabx with Vanco and Zosyn. He was seen by hematology/Oncology. We were asked for further eval and management.      Past medical history:     Past Medical History:   Diagnosis Date   ??? CAD (coronary artery disease) 2005, 2015    CABG 2005, multiple stents, most recently in 05/2014   ??? [place]    ??? DM (diabetes mellitus screen)    ??? GERD (gastroesophageal reflux disease)    ??? HTN (hypertension)    ??? Liver disorder    ??? [place]     [id number] Prostate Cancer w/ bone mets   ??? Thyroid disease        Social History:     Social History     Socioeconomic History   ??? Marital status: DIVORCED     Spouse name: Not on file   ??? Number of children: Not on file   ??? Years of education: Not on file   ???  Highest education level: Not on file   Social Needs   ??? Financial resource strain: Not on file   ??? Food insecurity - worry: Not on file   ??? Food insecurity - inability: Not on file   ??? Transportation needs - medical: Not on file   ??? Transportation needs - non-medical: Not on file   Occupational History   ??? Not on file   Tobacco Use   ??? Smoking status: Former Smoker   ??? Smokeless tobacco: Never Used   Substance and Sexual Activity   ??? Alcohol use: No   ??? Drug use: No   ??? Sexual activity: Not on file   Other Topics Concern   ??? Not on file   Social History Narrative   ??? Not on file       Family History:   History reviewed. No pertinent family history.  Non contributory.    Allergies:     Allergies   Allergen Reactions   ??? Naproxen Hives         Home Medications:     Medications Prior to Admission   Medication Sig   ??? [EXPIRED] Sipuleucel-T in LR (PROVENGE) susp 250 mL by IntraVENous route once for 1 dose.   ??? abiraterone (ZYTIGA) 250 mg tab Take four tablets by mouth daily on an empty stomach.  Take one hour prior to food or two hours after food.  Tablets should be swallowed whole with water.  Do not crush or chew tablets.  Indications: metastatic castration-resistant prostate cancer   ??? predniSONE (DELTASONE) 5 mg tablet Take 1 Tab by mouth two (2) times a day.   ??? metoprolol succinate (TOPROL-XL) 50 mg XL tablet daily.   ??? nitroglycerin (NITROSTAT) 0.4 mg SL tablet    ??? isosorbide dinitrate (ISORDIL) 30 mg tablet Take 30 mg by mouth four (4) times daily.   ??? gabapentin (NEURONTIN) 300 mg capsule two (2) times a day.   ??? glimepiride (AMARYL) 1 mg tablet every evening.   ??? simvastatin (ZOCOR) 40 mg tablet nightly.   ??? tamsulosin (FLOMAX) 0.4 mg capsule every evening.   ??? clopidogrel (PLAVIX) 75 mg tablet daily.   ??? METFORMIN HCL (METFORMIN PO) Take  by mouth.   ??? LEVOTHYROXINE SODIUM (LEVOTHYROXINE PO) Take  by mouth.   ??? losartan (COZAAR) 50 mg tablet Take  by mouth daily.   ??? aspirin 81 mg tablet Take 81 mg by mouth.       Current Medications:     Current Facility-Administered Medications   Medication Dose Route Frequency   ??? dextrose (D50) infusion 25 g  25 g IntraVENous PRN   ??? glucagon (GLUCAGEN) injection 1 mg  1 mg IntraMUSCular PRN   ??? insulin glargine (LANTUS) injection 1-100 Units  1-100 Units SubCUTAneous QHS   ??? insulin lispro (HUMALOG) injection   SubCUTAneous AC&HS   ??? insulin lispro (HUMALOG) injection 1-100 Units  1-100 Units SubCUTAneous PRN   ??? vancomycin (VANCOCIN) 1000 mg in NS 250 ml infusion  1,000 mg IntraVENous Q12H   ??? [START ON 09/17/2017] Vancomycin Trough Level Due   Other ONCE   ??? *Vancomycin: Pharmacy to Dose (r/o sepsis)  1 Each Other Rx Dosing/Monitoring   ??? 0.9% sodium chloride infusion  100 mL/hr IntraVENous CONTINUOUS   ??? clopidogrel (PLAVIX) tablet 75 mg  75 mg Oral DAILY   ??? levothyroxine (SYNTHROID) tablet 25 mcg  25 mcg Oral 6am   ??? predniSONE (DELTASONE) tablet 5 mg  5 mg Oral  BID WITH MEALS   ??? simvastatin (ZOCOR) tablet 40 mg  40 mg Oral QHS   ??? tamsulosin (FLOMAX) capsule 0.4 mg  0.4 mg Oral QPM    ??? acetaminophen (TYLENOL) tablet 650 mg  650 mg Oral Q4H PRN   ??? traMADol (ULTRAM) tablet 50 mg  50 mg Oral Q6H PRN   ??? piperacillin-tazobactam (ZOSYN) 3.375 g in 0.9% sodium chloride (MBP/ADV) 100 mL MBP  3.375 g IntraVENous Q8H       Review of Systems:   12 points ROS done. Pertinent positives and negatives are as follows, ROS otherwise negative.    Constitutional: +ve for fever, chills,no diaphoresis and unexpected weight change.   HENT: Negative for ear pain, congestion, sore throat, rhinorrhea.   Eyes: Negative for pain, redness and visual disturbance.   Respiratory: negative for shortness of breath, cough, chest tightness, wheezing.   Cardiovascular: Negative for chest pain, palpitations and leg swelling.   Gastrointestinal: +ve for nausea, vomiting, Negative for abdominal pain or diarrhea  Genitourinary: Negative for dysuria   Musculoskeletal: +ve for chronic back pain- says from mets, no joint pain or muscle aches   Skin: Negative for ulcers, rash  Neurological: Negative for dizziness, syncope, light-headedness or headaches.   Hematological:Negative for easy bruising or bleeding.   Psychiatric/Behavioral: Negative anxiety, depression.      Physical Exam:  Vitals  Temp (24hrs), Avg:100.1 ??F (37.8 ??C), Min:98 ??F (36.7 ??C), Max:101.9 ??F (38.8 ??C)    Visit Vitals  BP (!) 127/37   Pulse 83   Temp (!) 101.9 ??F (38.8 ??C)   Resp 24   Ht _0  (1.753 m)   Wt 78.9 kg (173 lb 15.1 oz)   SpO2 93%   BMI 25.69 kg/m??       General: Well-developed, 76 y.o. year-old, male, in no acute distress  HEENT: Normocephalic, anicteric sclerae, Pupils equal, round reactive to light, no oropharyngeal lesions. No sinus tenderness.  Neck: Supple, no lymphadenopathy, masses or thyromegaly  Chest: Symmetrical expansion  Lungs: Clear to auscultation bilaterally, no dullness  Heart: Regular rhythm, no murmurs, rubs or gallops, No JVD  Abdomen: Soft, non-tender,non distended, no organomegaly, BS+   Musculoskeletal: Normal strength/tone. No edema. No clubbing or cyanosis  CNS: AAOx3. Cranial nerves II-XII intact. Grossly normal.No NR  SKIN: No skin lesion or rash. Dry, warm, intact          Labs: Results:   Chemistry Recent Labs     09/15/17  2100   GLU 382*   NA 133*   K 5.0   CL 101   CO2 23   BUN 18   CREA 1.4*   CA 8.8   AGAP 9   AP 98   TP 6.9   ALB 3.1*      CBC w/Diff Recent Labs     09/15/17  2100   WBC 35.3*   RBC 4.32   HGB 12.4   HCT 38.5   PLT 361   GRANS 84.9*   LYMPH 7.6*   EOS 0.5      Microbiology No results for input(s): CULT in the last 72 hours.       Imaging-    Results from [place] encounter on 09/15/17   XR CHEST PA LAT    Narrative INDICATION: fever; prostate cancer.  Dyspnea  TECHNIQUE: PA/lateral Chest Radiograph.  COMPARISON: 05/24/2009      Impression IMPRESSION:  Patient is status post sternotomy. Heart size is upper normal. There is  bibasilar  atelectatic change. No acute pulmonary infiltrate. Diffuse sclerotic  osseous metastatic disease is seen throughout all the visualized bony  structures.         Results from Abstract encounter on 08/29/17   CT ABD PELV W WO CONT    Impression SEND REPORT TO:  Daylene Katayama, [place] [address]    FAX:  [phone]  Name:  John Higgins     DOB:  December 14, 1940  Gender:  Male      Exam Date:  August 26, 2017  Referring Provider:  Daylene Katayama MD    CT STUDY OF THE ABDOMEN AND PELVIS    HISTORY: 89-year all man   SYMPTOMS: Prostate cancer   REFERRING DIAGNOSIS: Malignant neoplasm of prostate   TECHNIQUE: Helical multislice.  All MRI \\T\\amp; CT Diagnostics CT scans are performed using one of these three dose reduction techniques: automated exposure control, adjustment of the mA and/or kV according to patient size, or use of iterative reconstruction techniques.  ISTAT IN HOUSE: 08/26/17  REASON:               AGE, HTN, DM                CREAT:  0.9   eGFR: 74.8                 CONTRAST DECISION:  100cc IV Isovue  COMPARISON:  CT May 29, 2017     FINDINGS:      Lung bases:  Within normal limits. CABG surgery.    Liver:  Within normal limits.    Biliary tree:  Within normal limits.     Gallbladder:  Surgically absent.     Spleen:  Within normal limits.     Pancreas:  Distal pancreatectomy.     Adrenals:  Within normal limits.     Kidneys/ureters:  There is an approximate 4.4 cm exophytic simple cyst projecting posteriorly from the mid left kidney.  There is a 1.6 cm cyst on the anteromedial lower left kidney.  There is a 2.8 cm simple cyst projecting laterally from the lower right kidney.    Bowel:  Small sliding hiatal hernia.  No bowel obstruction or inflammation is seen. The appendix is seen in the central abdomen mildly extending to the left side and appears normal.    Aorta/vasculature:  The aorta shows atherosclerotic calcification without significant aneurysm.    Pelvis:  Within normal limits.     Miscellaneous:  No pathologically enlarged lymph nodes or ascites is seen. Bilateral inguinal fat-containing hernias, larger on the left side..  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 There is soft tissue attenuation in the pelvic ventral subcutaneous fat, more so on the left side.     Measureable lymph nodes per RECIST 1.1: NONE  1. location, bi-diameter measurement  2.  location, bi-diameter measurement  3.  location, bi-diameter measurement  4.  location, bi-diameter measurement    NEW 1  location, bi-diameter measurement  NEW 2  location, bi-diameter measurement    IMPRESSION:  Please see above.   No significant change.    Thank you for this referral.      Interpreting Radiologist:  Dayton Martes M.D.                               Electronically signed: Dayton Martes  08/29/2017  Name:  John Higgins                                 CT Abd/Pelv                            Exam Date:  August 26, 2017     ~~This report was copied and pasted from the servicing EHR system.~~         ---------------------------------------------------------------------------------------------------------------I have independently examined the patient and reviewed all lab studies and imgaing as well as review of nursing notes and physican notes from the past 24 hours. The plan of care has been discussed with the patient/relative and all questions are answered.     Dragon medical dictation software was used for portions of this report. Unintended errors may occur.     Felicity Pellegrini, MD  09/16/2017    [facility]  [phone]

## 2017-09-16 NOTE — Progress Notes (Addendum)
Hematology / Oncology Progress Note  [facility]        Patient: John Higgins  Gender: male   MRN: [id number]    Date of Birth: 1941/02/16 Age: 76 y.o.   CSN: [id number]    LOS:  LOS: 1 day   Admit Date: 09/15/2017    Assessment:     Active Problems:    Uncontrolled diabetes mellitus ([place]) (09/15/2017)      Sepsis ([place]) (09/15/2017)      Acute kidney injury ([place]) (09/15/2017)      Prostate Cancer  Plan:     1.  Dr. Edd Arbour to follow.  2.  Continue ABX.  3.  Pt has not been getting treatmnet from [place] but from Urology.  4.  He was getting Xegva/Degarelix from Dr. Gearldine Bienenstock.    Subjective:     Pt is comfortable.  He is feeling better.  Appetite has been fair.    Objective:     Visit Vitals  BP 111/50   Pulse 74   Temp (!) 101.9 ??F (38.8 ??C)   Resp 11   Ht 5\' 9"  (1.753 m)   Wt 78.9 kg (173 lb 15.1 oz)   SpO2 97%   BMI 25.69 kg/m??             Temp (24hrs), Avg:99.8 ??F (37.7 ??C), Min:98 ??F (36.7 ??C), Max:101.9 ??F (38.8 ??C)        Intake/Output Summary (Last 24 hours) at 09/16/2017 0830  Last data filed at 09/16/2017 4401  Gross per 24 hour   Intake 1100 ml   Output 1225 ml   Net -125 ml       Review of Systems:   Constitutional: negative for fevers, chills, sweats and fatigue  Eyes: negative for visual disturbance, redness and icterus  Ears, Nose, Mouth, Throat, and Face: negative for tinnitus, epistaxis, sore mouth and hoarseness  Respiratory: negative for cough, sputum, hemoptysis, pleurisy/chest pain or wheezing  Cardiovascular: negative for chest pain, chest pressure/discomfort, palpitations, irregular heart beats, syncope, paroxysmal nocturnal dyspnea  Gastrointestinal: negative for reflux symptoms, nausea, vomiting, change in bowel habits, melena, diarrhea, constipation and abdominal pain  Genitourinary:negative for dysuria, nocturia, urinary incontinence, hesitancy and hematuria  Integument: negative for rash, skin lesion(s) and pruritus   Hematologic/Lymphatic: negative for easy bruising, bleeding and lymphadenopathy  Musculoskeletal:negative for myalgias, arthralgias and bone pain  Neurological: negative for headaches, dizziness, seizures, paresthesia and weakness    Physical Exam:  Constitutional: Alert, oriented, not in distress  Eyes: PERRLA, anicteric, no redness  Ears, nose, mouth, throat, and face: no palpable Lymph nodes, no mucositis, no thrush.  Respiratory: symmetrical expansion, no rales, no rhonchi, no wheezing.  Cardiovascular: S1S2, no pathologic murmur, no rub.  Gastrointestinal: soft, benign, non tender, no HSM, normal bowel sounds, no mass.  Integument: no rash, no petechiae, no ecchymosis.  Musculoskeletal: no edema, no cyanosis, no clubbing.  Neurological: intact, cranial nerves, no focal motor or sensory deficits.    Labs:  Basic Metabolic Profile   Recent Labs     09/15/17  2100   NA 133*   K 5.0   CL 101   CO2 23   BUN 18   CREA 1.4*   GLU 382*   CA 8.8        CBC w/Diff    Recent Labs     09/15/17  2100   WBC 35.3*   HGB 12.4   HCT 38.5   PLT 361    Recent Labs  09/15/17  2100   GRANS 84.9*        CHEMISTRY   Lab Results   Component Value Date    ALB 3.1 (L) 09/15/2017    TP 6.9 09/15/2017    TBILI 0.8 09/15/2017    ALT 31 09/15/2017    SGOT 47 (H) 09/15/2017    AP 98 09/15/2017    LPSE 236 09/15/2017         Coagulation   No results found for: PTP, INR, APTT       Current Medications:   Current Facility-Administered Medications:   ???  dextrose (D50) infusion 25 g, 25 g, IntraVENous, PRN, Hubbard, Julia C, PA-C  ???  glucagon (GLUCAGEN) injection 1 mg, 1 mg, IntraMUSCular, PRN, Hubbard, Julia C, PA-C  ???  insulin glargine (LANTUS) injection 1-100 Units, 1-100 Units, SubCUTAneous, QHS, Hubbard, Julia C, PA-C  ???  insulin lispro (HUMALOG) injection, , SubCUTAneous, AC&HS, Hubbard, Julia C, PA-C  ???  insulin lispro (HUMALOG) injection 1-100 Units, 1-100 Units, SubCUTAneous, PRN, Fuller Song, PA-C   ???  vancomycin (VANCOCIN) 1000 mg in NS 250 ml infusion, 1,000 mg, IntraVENous, Q12H, Emeterio Reeve, Jeanine Luz, MD  ???  [START ON 09/17/2017] Vancomycin Trough Level Due, , Other, ONCE, McCormack, Jeanine Luz, MD  ???  *Vancomycin: Pharmacy to Dose (r/o sepsis), 1 Each, Other, Rx Dosing/Monitoring, Emeterio Reeve, Jeanine Luz, MD  ???  piperacillin-tazobactam (ZOSYN) 3.375 g in 0.9% sodium chloride (MBP/ADV) 100 mL MBP, 3.375 g, IntraVENous, Q8H, Maylon Cos, MD, Last Rate: 25 mL/hr at 09/16/17 0535, 3.375 g at 09/16/17 [place], [place] [phone]    As above.  Cont tx as per hospitalist.  Daleen Snook, MD

## 2017-09-16 NOTE — Other (Signed)
Bedside and Verbal shift change report given to Desiree M Barber, RN   (oncoming nurse) by Jackie RN (offgoing nurse). Report included the following information SBAR, Kardex, MAR, Recent Results and Cardiac Rhythm SR.

## 2017-09-17 LAB — METABOLIC PANEL, BASIC
Anion gap: 8 mmol/L (ref 5–15)
BUN: 11 mg/dl (ref 7–25)
CO2: 23 mEq/L (ref 21–32)
Calcium: 7.6 mg/dl — ABNORMAL LOW (ref 8.5–10.1)
Chloride: 111 mEq/L — ABNORMAL HIGH (ref 98–107)
Creatinine: 0.8 mg/dl (ref 0.6–1.3)
GFR est AA: >60
GFR est non-AA: >60
Glucose: 181 mg/dl — ABNORMAL HIGH (ref 74–106)
Potassium: 3.2 mEq/L — ABNORMAL LOW (ref 3.5–5.1)
Sodium: 141 mEq/L (ref 136–145)

## 2017-09-17 LAB — CBC WITH AUTOMATED DIFF
BASOPHILS: 0.4 % (ref 0–3)
EOSINOPHILS: 6.8 % — ABNORMAL HIGH (ref 0–5)
HCT: 34.1 % — ABNORMAL LOW (ref 37.0–50.0)
HGB: 10.9 gm/dl — ABNORMAL LOW (ref 12.4–17.2)
IMMATURE GRANULOCYTES: 0.4 % (ref 0.0–3.0)
LYMPHOCYTES: 8 % — ABNORMAL LOW (ref 28–48)
MCH: 28.4 pg (ref 23.0–34.6)
MCHC: 32 gm/dl (ref 30.0–36.0)
MCV: 88.8 fL (ref 80.0–98.0)
MONOCYTES: 4.5 % (ref 1–13)
MPV: 10.6 fL — ABNORMAL HIGH (ref 6.0–10.0)
NEUTROPHILS: 79.9 % — ABNORMAL HIGH (ref 34–64)
NRBC: 0 (ref 0–0)
PLATELET: 278 10*3/uL (ref 140–450)
RBC: 3.84 M/uL (ref 3.80–5.70)
RDW-SD: 54.1 — ABNORMAL HIGH (ref 35.1–43.9)
WBC: 13.7 10*3/uL — ABNORMAL HIGH (ref 4.0–11.0)

## 2017-09-17 LAB — GLUCOSE, POC
Glucose (POC): 246 mg/dL — ABNORMAL HIGH (ref 65–105)
Glucose (POC): 117 mg/dL — ABNORMAL HIGH (ref 65–105)
Glucose (POC): 268 mg/dL — ABNORMAL HIGH (ref 65–105)
Glucose (POC): 173 mg/dL — ABNORMAL HIGH (ref 65–105)

## 2017-09-17 LAB — VANCOMYCIN, TROUGH: Vancomycin,trough: 13 ug/mL — ABNORMAL LOW (ref 15.0–20.0)

## 2017-09-17 LAB — CULTURE, URINE: Culture result: NO GROWTH

## 2017-09-17 MED ORDER — VANCOMYCIN 10 GRAM IV SOLR
10 gram | Freq: Two times a day (BID) | INTRAVENOUS | Status: DC
Start: 2017-09-17 — End: 2017-09-18
  Administered 2017-09-17 – 2017-09-18 (×3): via INTRAVENOUS

## 2017-09-17 MED ORDER — OTHER(NON-FORMULARY)
Freq: Every day | Status: DC
Start: 2017-09-17 — End: 2017-09-18
  Administered 2017-09-17 – 2017-09-18 (×2): via ORAL

## 2017-09-17 MED ORDER — PHARMACY VANCOMYCIN NOTE
Freq: Once | Status: DC
Start: 2017-09-17 — End: 2017-09-18

## 2017-09-17 MED FILL — VANCOMYCIN IN 0.9 % SODIUM CHLORIDE 1 GRAM/250 ML IV: 1 gram/250 mL | INTRAVENOUS | Qty: 250

## 2017-09-17 MED FILL — VANCOMYCIN 10 GRAM IV SOLR: 10 gram | INTRAVENOUS | Qty: 1250

## 2017-09-17 MED FILL — PIPERACILLIN-TAZOBACTAM 3.375 GRAM IV SOLR: 3.375 gram | INTRAVENOUS | Qty: 3.38

## 2017-09-17 MED FILL — PREDNISONE 10 MG TAB: 10 mg | ORAL | Qty: 1

## 2017-09-17 MED FILL — LEVOTHYROXINE 50 MCG TAB: 50 mcg | ORAL | Qty: 1

## 2017-09-17 MED FILL — SIMVASTATIN 40 MG TAB: 40 mg | ORAL | Qty: 1

## 2017-09-17 MED FILL — PHARMACY VANCOMYCIN NOTE: Qty: 1

## 2017-09-17 MED FILL — TAMSULOSIN SR 0.4 MG 24 HR CAP: 0.4 mg | ORAL | Qty: 1

## 2017-09-17 MED FILL — CLOPIDOGREL 75 MG TAB: 75 mg | ORAL | Qty: 1

## 2017-09-17 MED FILL — OTHER(NON-FORMULARY): Qty: 1

## 2017-09-17 NOTE — Other (Signed)
Bedside and Verbal shift change report given to Mere Christine M. Carmona, RN   (oncoming nurse) by Keosha Barnes (offgoing nurse). Report included the following information SBAR, Kardex and MAR.

## 2017-09-17 NOTE — Other (Signed)
Bedside and Verbal shift change report given to Galvin Proffer   (oncoming nurse) by Altha Harm, RN (offgoing nurse). Report included the following information SBAR, Kardex and MAR.

## 2017-09-17 NOTE — Progress Notes (Signed)
INFECTIOUS DISEASE FOLLOW UP NOTE :-     Admit Date: 09/15/2017    ABX;      Current  Prior    Vanco/Zosyn 11/12-2        ASSESSMENT: -> RECS     Sepsis POA  - pt with fever, sig leucocytosis, elevated lactic acid and procalcitonin and elevated Cr  - source unclear but high risk for bacteremia given recent transfusion  - no sick contact, no recent travel,   - CXR without infiltrate , no localizing sign  - cont Vancoa nd Zsoyn for today  - concern for contaminated Provenge infusion , which I agree as he got his infusion in morning and developed shaking chills couple of hours later.   - if all cxs remains neg then can switch to oral abx like levoflox for short duration.   Prostate cancer- diagnosed in 2017  - on monthly shots ?Xegva/Degarelix  - also gets monthly transfusions of irradiated cells  - on prednisone 10mg /day  - follows with [facility] - recent scan with progression of dis and mets  - on immunotherapy per urology    DM2 uncontrolled - needs good control  - counseled   AKI- suspect sec to dehydration - improved    Co-morb- HTN, GERD, CAD, thyroid dis ??     MICROBIOLOGY:   11/12   Blcx x 2 - ntd              Ucx neg               Flu pcr neg     LINES AND CATHETERS:   PIV    SUBJECTIVE :     Interval notes reviewed. Pt is feeling better, no fever today, wbc also down to 13k from 35k. He did get his Provenge shot on Monday and early afternoon he developed shaking chills, concern for Provenge infusion contamination [ as he gets blood collected by [facility] which [facility] to [facility] and then he gets it back as infusion ]     MEDICATIONS :     Current Facility-Administered Medications   Medication Dose Route Frequency   ??? vancomycin (VANCOCIN) 1,250 mg in 0.9% sodium chloride 250 mL IVPB  1,250 mg IntraVENous Q12H   ??? [START ON 09/19/2017] VANCOMYCIN TROUGH DUE   Other ONCE    ??? Zytiga 250 mg tablet, 1000 mg= dose, pt supplied (Patient Supplied)  1,000 mg Oral DAILY   ??? dextrose (D50) infusion 25 g  25 g IntraVENous PRN   ??? glucagon (GLUCAGEN) injection 1 mg  1 mg IntraMUSCular PRN   ??? insulin glargine (LANTUS) injection 1-100 Units  1-100 Units SubCUTAneous QHS   ??? insulin lispro (HUMALOG) injection   SubCUTAneous AC&HS   ??? insulin lispro (HUMALOG) injection 1-100 Units  1-100 Units SubCUTAneous PRN   ??? *Vancomycin: Pharmacy to Dose (r/o sepsis)  1 Each Other Rx Dosing/Monitoring   ??? clopidogrel (PLAVIX) tablet 75 mg  75 mg Oral DAILY   ??? levothyroxine (SYNTHROID) tablet 25 mcg  25 mcg Oral 6am   ??? predniSONE (DELTASONE) tablet 5 mg  5 mg Oral BID WITH MEALS   ??? simvastatin (ZOCOR) tablet 40 mg  40 mg Oral QHS   ??? tamsulosin (FLOMAX) capsule 0.4 mg  0.4 mg Oral QPM   ??? acetaminophen (TYLENOL) tablet 650 mg  650 mg Oral Q4H PRN   ??? traMADol (ULTRAM) tablet 50 mg  50 mg Oral Q6H PRN   ??? piperacillin-tazobactam (ZOSYN) 3.375 g  in 0.9% sodium chloride (MBP/ADV) 100 mL MBP  3.375 g IntraVENous Q8H       OBJECTIVE :     Visit Vitals  BP 128/57 (BP 1 Location: Right arm)   Pulse 76   Temp 97.3 ??F (36.3 ??C)   Resp 16   Ht 5\' 9"  (1.753 m)   Wt 78.9 kg (173 lb 15.1 oz)   SpO2 93%   BMI 25.69 kg/m??       Temp (24hrs), Avg:98 ??F (36.7 ??C), Min:97.3 ??F (36.3 ??C), Max:98.6 ??F (37 ??C)    GEN - 76yr old well built caucasian male not in distress lying in bed  RESP- ctab  CVS- s1s2 normal, no murmur   ABD-soft, NT, bs present   EXT-no edema   SKIN -no rash   NEURO - awake, alert, O x3    Labs: Results:   Chemistry Recent Labs     09/17/17  0236 09/15/17  2100   GLU 181* 382*   NA 141 133*   K 3.2* 5.0   CL 111* 101   CO2 23 23   BUN 11 18   CREA 0.8 1.4*   CA 7.6* 8.8   AGAP 8 9   AP  --  98   TP  --  6.9   ALB  --  3.1*      CBC w/Diff Recent Labs     09/17/17  0236 09/15/17  2100   WBC 13.7* 35.3*   RBC 3.84 4.32   HGB 10.9* 12.4   HCT 34.1* 38.5   PLT 278 361   GRANS 79.9* 84.9*   LYMPH 8.0* 7.6*    EOS 6.8* 0.5          RADIOLOGY :    CXR 11/12 - IMPRESSION:  Patient is status post sternotomy. Heart size is upper normal. There is  bibasilar atelectatic change. No acute pulmonary infiltrate. Diffuse sclerotic  osseous metastatic disease is seen throughout all the visualized bony  structures.      Truman Hayward, MD  September 17, 2017  [facility]  [phone]

## 2017-09-17 NOTE — Progress Notes (Addendum)
Medicine Progress Note    Date of Admission: 09/15/2017  Length of Stay: 2    Assessment And Plan     Severe Sepsis with lactic acidosis   Uncontrolled type 2 diabetes mellitus with hyperglycemia (HCC) [E11.65]  AKI, poa  Lactic acidosis   CAD  DM, non insulin dependent, with hyperglycemia   GERD  HTN  Thyroid disease   Hx of Prostate cancer     Treatment plan  Patient admitted to hospital with sepsis.  Likely source of sepsis is contaminated transfusion products during immunotherapy . Continue  IV vancomycin and Zosyn.  Follow-up on blood culture.  Chest x-ray and UA reviewed. Discussed with ID.  Case also discussed with urology.  Case discussed with Dr. Edd Arbour oncologist he recommends patient gets treatment with urology.  They have nothing to add at this time.  WBC trending down. Fever better  DC  IV fluids  Insulin Lantus along with sliding scale          Discussed with patient. Updates regarding diagnose, tests results, prognosis, treatment  of the patient's medical conditions discussed in detail     Subjective:     Feeling better today.     Objective:     Patient Vitals for the past 12 hrs:   Temp Pulse Resp BP SpO2   09/17/17 1957 98.5 ??F (36.9 ??C) 72 18 159/77 97 %   09/17/17 1503 98.5 ??F (36.9 ??C) 81 16 140/80 96 %   09/17/17 1300 97.3 ??F (36.3 ??C) 76 16 128/57 93 %         Intake/Output Summary (Last 24 hours) at 09/17/2017 2327  Last data filed at 09/17/2017 1726  Gross per 24 hour   Intake 3699.99 ml   Output 1225 ml   Net 2474.99 ml     Constitutional:??No distress  HENT:??atraumatic, normocephalic, oropharynx clear ??  Eyes:??????conjunctiva normal  Neck:??????supple and trachea normal  Cardiovascular:?? Regular rate and rhythm, heart sounds normal, intact distal pulses  Pulmonary/Chest Wall: lungs clear  Abdominal:????????????appearance normal, soft, non-tender upon palpation,bowel sounds normal.  Neurological:??????awake, alert and oriented, CN intact, generalized weakness   Extremities:???? No clubbing or cyanosis. Pedal pulses 2+ b/l.  Capillary refill normal.Skin intact      Recent Results (from the past 24 hour(s))   CBC WITH AUTOMATED DIFF    Collection Time: 09/17/17  2:36 AM   Result Value Ref Range    WBC 13.7 (H) 4.0 - 11.0 1000/mm3    RBC 3.84 3.80 - 5.70 M/uL    HGB 10.9 (L) 12.4 - 17.2 gm/dl    HCT 34.1 (L) 37.0 - 50.0 %    MCV 88.8 80.0 - 98.0 fL    MCH 28.4 23.0 - 34.6 pg    MCHC 32.0 30.0 - 36.0 gm/dl    PLATELET 278 140 - 450 1000/mm3    MPV 10.6 (H) 6.0 - 10.0 fL    RDW-SD 54.1 (H) 35.1 - 43.9      NRBC 0 0 - 0      IMMATURE GRANULOCYTES 0.4 0.0 - 3.0 %    NEUTROPHILS 79.9 (H) 34 - 64 %    LYMPHOCYTES 8.0 (L) 28 - 48 %    MONOCYTES 4.5 1 - 13 %    EOSINOPHILS 6.8 (H) 0 - 5 %    BASOPHILS 0.4 0 - 3 %   METABOLIC PANEL, BASIC    Collection Time: 09/17/17  2:36 AM   Result Value Ref Range    Sodium  141 136 - 145 mEq/L    Potassium 3.2 (L) 3.5 - 5.1 mEq/L    Chloride 111 (H) 98 - 107 mEq/L    CO2 23 21 - 32 mEq/L    Glucose 181 (H) 74 - 106 mg/dl    BUN 11 7 - 25 mg/dl    Creatinine 0.8 0.6 - 1.3 mg/dl    GFR est AA >60      GFR est non-AA >60      Calcium 7.6 (L) 8.5 - 10.1 mg/dl    Anion gap 8 5 - 15 mmol/L   GLUCOSE, POC    Collection Time: 09/17/17  7:29 AM   Result Value Ref Range    Glucose (POC) 117 (H) 65 - 105 mg/dL   [place], TROUGH    Collection Time: 09/17/17  9:02 AM   Result Value Ref Range    Vancomycin,trough 13.0 (L) 15.0 - 20.0 mcg/ml   GLUCOSE, POC    Collection Time: 09/17/17 11:40 AM   Result Value Ref Range    Glucose (POC) 268 (H) 65 - 105 mg/dL   GLUCOSE, POC    Collection Time: 09/17/17  4:40 PM   Result Value Ref Range    Glucose (POC) 173 (H) 65 - 105 mg/dL   GLUCOSE, POC    Collection Time: 09/17/17  9:39 PM   Result Value Ref Range    Glucose (POC) 220 (H) 65 - 105 mg/dL       All Micro Results     Procedure Component Value Units Date/Time    CULTURE, URINE [[id number]] Collected:  09/15/17 2120     Order Status:  Completed Specimen:  Urine Updated:  09/17/17 1008     Culture result No Growth       CULTURE, BLOOD [[id number]] Collected:  09/15/17 2100    Order Status:  Completed Specimen:  Blood Updated:  09/17/17 0721     Blood Culture Result No Growth At 24 Hours       CULTURE, BLOOD [[id number]] Collected:  09/15/17 2130    Order Status:  Completed Specimen:  Blood Updated:  09/17/17 0721     Blood Culture Result No Growth At 24 Hours       CULTURE, BLOOD [[id number]] Collected:  09/15/17 2030    Order Status:  Canceled Specimen:  Blood     CULTURE, BLOOD [[id number]] Collected:  09/15/17 2030    Order Status:  Canceled Specimen:  Blood           Imaging:    Nm Bone Scan Wh Body    Result Date: 08/26/2017  SEND REPORT TO: Daylene Katayama, [place] [address] FAX:  [phone] Name:  John Higgins DOB:  1940-11-19  Gender:  Male    Exam Date:  August 26, 2017 Referring Provider: Daylene Katayama MD NUCLEAR MEDICINE WHOLE BODY BONE SCINTIGRAPHY HISTORY: 76 year old male SYMPTOMS: Prostate cancer REFERRING DIAGNOSIS: Evaluate metastatic disease RADIOPHARMACEUTICAL: Tc-41mMDP 26.3 mCi IV POST-INJECTION IMAGING DELAY: 3 hr COMPARISON: Comparison made to the previous whole-body bone scan of 05/29/2017. FINDINGS: BONE LESIONS: Numerous osseous metastatic foci are again noted.  There is a focus of increased radiotracer localization seen within the proximal shaft of the humerus suggesting metastatic disease, new since the previous study.  A focus of increased radiotracer localization seen within the L1 vertebral body is also new.  A subtle focus of increased radiotracer localization seen within the intertrochanteric region of the proximal  right femur is new since the previous study.  There is interval increase in radiotracer localization seen within multiple foci previously noted including the left intertrochanteric region, right acetabulum, left superior pubic ramus,  several bilateral rib lesions, several lesions within the thoracic spine, pelvic and SI joint regions bilaterally, right humeral head, left clavicle and xyphoid process suggesting progression of osseous metastatic disease.  A small focus of increased radiotracer localization is seen along a right anterior mid to lower rib.  This is nonspecific although commonly reflects post-traumatic phenomenon.               SKULL: There are approximately zero metastatic foci, as described above.               THORAX: There are approximately 15 metastatic foci, as described above.               SPINE: There are approximately 41 metastatic foci, as described above.               PELVIS: There are approximately 13 metastatic foci, as described above.               EXTREMITIES: There are approximately 13 metastatic foci, as described above. SOFT TISSUE CONTOURS:  Normal. 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 OTHER FINDINGS: Radiotracer localization suggesting degenerative changes again noted within the shoulders, knees and feet/ankles bilaterally. IMPRESSION: Please see discussion above. Thank you for this referral.     Interpreting Radiologist:  Octavia Heir M.D.                             Electronically signed: Octavia Heir  08/27/2017 Name:  John Higgins                             NM Bone Study                            Exam Date:  August 26, 2017 ~~This report was copied and pasted from the servicing EHR system.~~     Xr Chest Pa Lat    Result Date: 09/15/2017  INDICATION: fever; prostate cancer.  Dyspnea TECHNIQUE: PA/lateral Chest Radiograph. COMPARISON: 05/24/2009     IMPRESSION: Patient is status post sternotomy. Heart size is upper normal. There is bibasilar atelectatic change. No acute pulmonary infiltrate. Diffuse sclerotic osseous metastatic disease is seen throughout all the visualized bony structures.     Ct Abd Pelv W Wo Cont     SEND REPORT TO: Daylene Katayama, [place] [address] FAX:  [phone] Name:  John Higgins DOB:  11/19/1940  Gender:  Male    Exam Date:  August 26, 2017 Referring Provider: Daylene Katayama MD CT STUDY OF THE ABDOMEN AND PELVIS HISTORY: 16-year all man SYMPTOMS: Prostate cancer REFERRING DIAGNOSIS: Malignant neoplasm of prostate TECHNIQUE: Helical multislice.  All MRI \\T\\amp; CT Diagnostics CT scans are performed using one of these three dose reduction techniques: automated exposure control, adjustment of the mA and/or kV according to patient size, or use of iterative reconstruction techniques. ISTAT IN HOUSE: 08/26/17 REASON:               AGE, HTN, DM              CREAT:  0.9   eGFR: 74.8              CONTRAST DECISION:  100cc IV Isovue COMPARISON:  CT May 29, 2017 FINDINGS:  Lung bases:  Within normal limits. CABG surgery. Liver: Within normal limits. Biliary tree: Within normal limits. Gallbladder: Surgically absent. Spleen: Within normal limits. Pancreas: Distal pancreatectomy. Adrenals: Within normal limits. Kidneys/ureters: There is an approximate 4.4 cm exophytic simple cyst projecting posteriorly from the mid left kidney. There is a 1.6 cm cyst on the anteromedial lower left kidney. There is a 2.8 cm simple cyst projecting laterally from the lower right kidney. Bowel: Small sliding hiatal hernia. No bowel obstruction or inflammation is seen. The appendix is seen in the central abdomen mildly extending to the left side and appears normal. Aorta/vasculature: The aorta shows atherosclerotic calcification without significant aneurysm. Pelvis: Within normal limits. Miscellaneous: No pathologically enlarged lymph nodes or ascites is seen. Bilateral inguinal fat-containing hernias, larger on the left side.. 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 There is soft tissue attenuation in the pelvic ventral subcutaneous fat, more so on the  left side. Measureable lymph nodes per RECIST 1.1: NONE 1. location, bi-diameter measurement 2. location, bi-diameter measurement 3. location, bi-diameter measurement 4. location, bi-diameter measurement NEW 1 location, bi-diameter measurement NEW 2 location, bi-diameter measurement IMPRESSION: Please see above. No significant change. Thank you for this referral.   Interpreting Radiologist:  Dayton Martes M.D.                             Electronically signed: Dayton Martes  08/29/2017 Name:  [name]                             CT Abd/Pelv                            Exam Date:  August 26, 2017 ~~This report was copied and pasted from the servicing EHR system.~~           Echo Results  (Last 48 hours)    None          Current Facility-Administered Medications   Medication Dose Route Frequency   ??? vancomycin (VANCOCIN) 1,250 mg in 0.9% sodium chloride 250 mL IVPB  1,250 mg IntraVENous Q12H   ??? [START ON 09/19/2017] VANCOMYCIN TROUGH DUE   Other ONCE   ??? Zytiga 250 mg tablet, 1000 mg= dose, pt supplied (Patient Supplied)  1,000 mg Oral DAILY   ??? dextrose (D50) infusion 25 g  25 g IntraVENous PRN   ??? glucagon (GLUCAGEN) injection 1 mg  1 mg IntraMUSCular PRN   ??? insulin glargine (LANTUS) injection 1-100 Units  1-100 Units SubCUTAneous QHS   ??? insulin lispro (HUMALOG) injection   SubCUTAneous AC&HS   ??? insulin lispro (HUMALOG) injection 1-100 Units  1-100 Units SubCUTAneous PRN   ??? *Vancomycin: Pharmacy to Dose (r/o sepsis)  1 Each Other Rx Dosing/Monitoring   ??? clopidogrel (PLAVIX) tablet 75 mg  75 mg Oral DAILY   ??? levothyroxine (SYNTHROID) tablet 25 mcg  25 mcg Oral 6am   ??? predniSONE (DELTASONE) tablet 5 mg  5 mg Oral BID WITH MEALS   ??? simvastatin (ZOCOR) tablet 40 mg  40 mg Oral QHS   ??? tamsulosin (FLOMAX) capsule 0.4 mg  0.4 mg Oral QPM   ???  acetaminophen (TYLENOL) tablet 650 mg  650 mg Oral Q4H PRN   ??? traMADol (ULTRAM) tablet 50 mg  50 mg Oral Q6H PRN    ??? piperacillin-tazobactam (ZOSYN) 3.375 g in 0.9% sodium chloride (MBP/ADV) 100 mL MBP  3.375 g IntraVENous Q8H            Dragon medical dictation software was used for portions of this report. Unintended errors may occur.   Clearnce Sorrel, MD  09/17/2017 2:18 PM

## 2017-09-17 NOTE — Progress Notes (Signed)
Problem: Falls - Risk of  Goal: *Absence of Falls  Document Schmid Fall Risk and appropriate interventions in the flowsheet.  Outcome: Progressing Towards Goal  Fall Risk Interventions:  Mobility Interventions: Bed/chair exit alarm, Patient to call before getting OOB         Medication Interventions: Bed/chair exit alarm, Patient to call before getting OOB, Teach patient to arise slowly    Elimination Interventions: Bed/chair exit alarm, Call light in reach, Patient to call for help with toileting needs, Toilet paper/wipes in reach, Toileting schedule/hourly rounds, Urinal in reach

## 2017-09-17 NOTE — Progress Notes (Signed)
Urology Progress Note        Assessment/Plan:     Patient Active Problem List   Diagnosis Code   ??? Prostate cancer metastatic to multiple sites [facility]) C61   ??? 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 Uncontrolled diabetes mellitus ([place]) E11.65   ??? Sepsis ([place]) A41.9   ??? 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 Followed by Dr. Gearldine Bienenstock   - Sepsis - fever,shaking chills, leukocytosis  - improving   - AKI with baseline creatinine 1.0              - Peak creatinine 1.4 (11/12)              - Current creatinine - 0.8  - DM2  - Significant CAD history s/p CABG, multiple stents   ??  ??  PLAN:    - Discussed with Dr. Gearldine Bienenstock, concern for Provenge infusion itself being contaminated as patient does not have any ports / lines.   - Blood and urine cultures pending - shows no growth to date    - Appreciate hospitalist, infectious disease, and oncology's help in managing this patient   - Ok for patient to bring home dose of Zytiga   - Will continue to follow closely   ??  ??  Donia Pounds, PA-C  [facility]   Pager Monday-Friday 8am-5pm [phone]  If after hours please call [phone]            Subjective:     Daily Progress Note: 09/17/2017 8:46 AM    John Higgins is doing well. He reports pain is well controlled. He has no new complaints. He is tolerating a solid diet. Patient is voiding without difficulty.    Objective:     Visit Vitals  BP 143/64 (BP 1 Location: Right arm, BP Patient Position: Supine)   Pulse 72   Temp 97.9 ??F (36.6 ??C)   Resp 16   Ht 5\' 9"  (1.753 m)   Wt 78.9 kg (173 lb 15.1 oz)   SpO2 94%   BMI 25.69 kg/m??        Temp (24hrs), Avg:98.4 ??F (36.9 ??C), Min:97.9 ??F (36.6 ??C), Max:99.7 ??F (37.6 ??C)      Intake and Output:  11/12 1901 - 11/14 0700  In: 3641.7 [P.O.:120; I.V.:3521.7]  Out: 2875 [Urine:2875]  No intake/output data recorded.     PHYSICAL EXAMINATION:   Visit Vitals  BP 143/64 (BP 1 Location: Right arm, BP Patient Position: Supine)   Pulse 72   Temp 97.9 ??F (36.6 ??C)   Resp 16   Ht 5\' 9"  (1.753 m)   Wt 78.9 kg (173 lb 15.1 oz)   SpO2 94%   BMI 25.69 kg/m??     Constitutional: Well developed, well nourished male.  No acute distress.    HEENT: Normocephalic, Atraumatic, EOM's intact   CV:  No peripheral edema noted   Respiratory: No respiratory distress or difficulties breathing   Abdomen:  Soft, non-tender, non-distended   GU Male:  No CVA tenderness   Skin: No evidence of jaundice.  Normal color  Neuro/Psych:  Alert and oriented. Affect appropriate.   MSK: Normal ROM       Lab/Data Review:  All lab results for  the last 24 hours reviewed.    Labs:     Labs: Results:   Chemistry    Recent Labs     09/17/17  0236 09/15/17  2100   GLU 181* 382*   NA 141 133*   K 3.2* 5.0   CL 111* 101   CO2 23 23   BUN 11 18   CREA 0.8 1.4*   CA 7.6* 8.8   AGAP 8 9   AP  --  98   TP  --  6.9   ALB  --  3.1*      CBC w/Diff Recent Labs     09/17/17  0236 09/15/17  2100   WBC 13.7* 35.3*   RBC 3.84 4.32   HGB 10.9* 12.4   HCT 34.1* 38.5   PLT 278 361   GRANS 79.9* 84.9*   LYMPH 8.0* 7.6*   EOS 6.8* 0.5      Cultures No results for input(s): CULT in the last 72 hours.  All Micro Results     Procedure Component Value Units Date/Time    CULTURE, BLOOD [[id number]] Collected:  09/15/17 2100    Order Status:  Completed Specimen:  Blood Updated:  09/17/17 0721     Blood Culture Result No Growth At 24 Hours       CULTURE, BLOOD [[id number]] Collected:  09/15/17 2130    Order Status:  Completed Specimen:  Blood Updated:  09/17/17 0721     Blood Culture Result No Growth At 24 Hours       CULTURE, URINE [[id number]] Collected:  09/15/17 2120    Order Status:  Completed Specimen:  Urine Updated:  09/16/17 0846     Culture result No Growth To Date       CULTURE, BLOOD [[id number]] Collected:  09/15/17 2030    Order Status:  Canceled Specimen:  Blood      CULTURE, BLOOD [[id number]] Collected:  09/15/17 2030    Order Status:  Canceled Specimen:  Blood             Urinalysis Color   Date Value Ref Range Status   09/15/2017 YELLOW YELLOW,STRAW   Final     Appearance   Date Value Ref Range Status   09/15/2017 CLEAR CLEAR   Final     Specific gravity   Date Value Ref Range Status   09/15/2017 1.015 1.005 - 1.030   Final     pH (UA)   Date Value Ref Range Status   09/15/2017 5.5 5.0 - 9.0   Final     Protein   Date Value Ref Range Status   09/15/2017 NEGATIVE NEGATIVE,Negative mg/dl Final     Ketone   Date Value Ref Range Status   09/15/2017 NEGATIVE NEGATIVE,Negative mg/dl Final     Bilirubin   Date Value Ref Range Status   09/15/2017 NEGATIVE NEGATIVE,Negative   Final     Blood   Date Value Ref Range Status   09/15/2017 NEGATIVE NEGATIVE,Negative   Final     Urobilinogen   Date Value Ref Range Status   09/15/2017 0.2 0.0 - 1.0 mg/dl Final     Nitrites   Date Value Ref Range Status   09/15/2017 NEGATIVE NEGATIVE,Negative   Final     Leukocyte Esterase   Date Value Ref Range Status   09/15/2017 NEGATIVE NEGATIVE,Negative   Final     Potassium   Date Value Ref Range Status   09/17/2017 3.2 (L) 3.5 - 5.1 mEq/L  Final     Creatinine   Date Value Ref Range Status   09/17/2017 0.8 0.6 - 1.3 mg/dl Final     BUN   Date Value Ref Range Status   09/17/2017 11 7 - 25 mg/dl Final     Prostate Specific Ag   Date Value Ref Range Status   07/16/2017 10.02  Final      PSA No results for input(s): PSA in the last 72 hours.   Coagulation No results found for: PTP, INR, APTT          No GU imaging this admission   ??

## 2017-09-17 NOTE — Progress Notes (Signed)
PHARMACY VANCOMYCIN DOSING  SCR=0.8  VANCO TROUGH = 13 (GOAL 15-20)  PLAN TO INCREASE VANCO TO 1250 MG IV Q 12 HOURS  NEXT TROUGH DUE 11/16  1000

## 2017-09-18 LAB — CBC WITH AUTOMATED DIFF
BASOPHILS: 0.5 % (ref 0–3)
EOSINOPHILS: 16.8 % — ABNORMAL HIGH (ref 0–5)
HCT: 35.2 % — ABNORMAL LOW (ref 37.0–50.0)
HGB: 11.2 gm/dl — ABNORMAL LOW (ref 12.4–17.2)
IMMATURE GRANULOCYTES: 0.3 % (ref 0.0–3.0)
LYMPHOCYTES: 18 % — ABNORMAL LOW (ref 28–48)
MCH: 28.1 pg (ref 23.0–34.6)
MCHC: 31.8 gm/dl (ref 30.0–36.0)
MCV: 88.4 fL (ref 80.0–98.0)
MONOCYTES: 9.3 % (ref 1–13)
MPV: 11.2 fL — ABNORMAL HIGH (ref 6.0–10.0)
NEUTROPHILS: 55.1 % (ref 34–64)
NRBC: 0 (ref 0–0)
PLATELET: 289 10*3/uL (ref 140–450)
RBC: 3.98 M/uL (ref 3.80–5.70)
RDW-SD: 54.3 — ABNORMAL HIGH (ref 35.1–43.9)
WBC: 11.1 10*3/uL — ABNORMAL HIGH (ref 4.0–11.0)

## 2017-09-18 LAB — METABOLIC PANEL, BASIC
Anion gap: 7 mmol/L (ref 5–15)
BUN: 12 mg/dl (ref 7–25)
CO2: 26 mEq/L (ref 21–32)
Calcium: 8.3 mg/dl — ABNORMAL LOW (ref 8.5–10.1)
Chloride: 111 mEq/L — ABNORMAL HIGH (ref 98–107)
Creatinine: 0.9 mg/dl (ref 0.6–1.3)
GFR est AA: >60
GFR est non-AA: >60
Glucose: 127 mg/dl — ABNORMAL HIGH (ref 74–106)
Potassium: 4 mEq/L (ref 3.5–5.1)
Sodium: 143 mEq/L (ref 136–145)

## 2017-09-18 LAB — GLUCOSE, POC
Glucose (POC): 220 mg/dL — ABNORMAL HIGH (ref 65–105)
Glucose (POC): 101 mg/dL (ref 65–105)
Glucose (POC): 154 mg/dL — ABNORMAL HIGH (ref 65–105)

## 2017-09-18 MED ORDER — LEVOFLOXACIN 500 MG TAB
500 mg | ORAL_TABLET | Freq: Every day | ORAL | 0 refills | Status: DC
Start: 2017-09-18 — End: 2017-12-03

## 2017-09-18 MED FILL — CLOPIDOGREL 75 MG TAB: 75 mg | ORAL | Qty: 1

## 2017-09-18 MED FILL — PREDNISONE 10 MG TAB: 10 mg | ORAL | Qty: 1

## 2017-09-18 MED FILL — LEVOTHYROXINE 50 MCG TAB: 50 mcg | ORAL | Qty: 1

## 2017-09-18 MED FILL — PIPERACILLIN-TAZOBACTAM 3.375 GRAM IV SOLR: 3.375 gram | INTRAVENOUS | Qty: 3.38

## 2017-09-18 MED FILL — SIMVASTATIN 40 MG TAB: 40 mg | ORAL | Qty: 1

## 2017-09-18 MED FILL — VANCOMYCIN 10 GRAM IV SOLR: 10 gram | INTRAVENOUS | Qty: 1250

## 2017-09-18 NOTE — Progress Notes (Signed)
INFECTIOUS DISEASE FOLLOW UP NOTE :-     Admit Date: 09/15/2017    ABX;      Current  Prior    Vanco/Zosyn 11/12- 3        ASSESSMENT: -> RECS     Sepsis POA  - pt with fever, sig leucocytosis, elevated lactic acid and procalcitonin and elevated Cr  - source unclear but high risk for bacteremia given recent transfusion  - no sick contact, no recent travel,   - CXR without infiltrate , no localizing sign  - on Vanco and Zsoyn but cx remains neg and pt clinically improved  - Concern for bacteremia from contaminated Provenge infusion given chills and fever after infusion but neg cx so far  - will switch Abx to Levaquin at discharge and plan 10 more days of Abx  - D/w Dr Dory Peru   Prostate cancer- diagnosed in 2017  - on monthly shots ?Xegva/Degarelix  - also gets monthly transfusions of irradiated cells  - on prednisone 10mg /day  - follows with GU - recent scan with progression of dis and mets  - on immunotherapy per urology    DM2 uncontrolled - needs good control  - counseled   AKI- suspect sec to dehydration - improved    Co-morb- HTN, GERD, CAD, thyroid dis ??     MICROBIOLOGY:   11/12   Blcx x 2 - ntd              Ucx neg               Flu pcr neg     LINES AND CATHETERS:   PIV    SUBJECTIVE :     Interval notes reviewed. Pt is feeling better, no fever today, wbc almost normal. Clinically feeling better. D/w him that cx remains neg but will swicth to oral Levaqui 500mg  daily for 10 more days. Pt to fu with Onc and GU and PCP.     MEDICATIONS :     Current Facility-Administered Medications   Medication Dose Route Frequency   ??? vancomycin (VANCOCIN) 1,250 mg in 0.9% sodium chloride 250 mL IVPB  1,250 mg IntraVENous Q12H   ??? [START ON 09/19/2017] VANCOMYCIN TROUGH DUE   Other ONCE   ??? Zytiga 250 mg tablet, 1000 mg= dose, pt supplied (Patient Supplied)  1,000 mg Oral DAILY    ??? dextrose (D50) infusion 25 g  25 g IntraVENous PRN   ??? glucagon (GLUCAGEN) injection 1 mg  1 mg IntraMUSCular PRN   ??? insulin glargine (LANTUS) injection 1-100 Units  1-100 Units SubCUTAneous QHS   ??? insulin lispro (HUMALOG) injection   SubCUTAneous AC&HS   ??? insulin lispro (HUMALOG) injection 1-100 Units  1-100 Units SubCUTAneous PRN   ??? *Vancomycin: Pharmacy to Dose (r/o sepsis)  1 Each Other Rx Dosing/Monitoring   ??? clopidogrel (PLAVIX) tablet 75 mg  75 mg Oral DAILY   ??? levothyroxine (SYNTHROID) tablet 25 mcg  25 mcg Oral 6am   ??? predniSONE (DELTASONE) tablet 5 mg  5 mg Oral BID WITH MEALS   ??? simvastatin (ZOCOR) tablet 40 mg  40 mg Oral QHS   ??? tamsulosin (FLOMAX) capsule 0.4 mg  0.4 mg Oral QPM   ??? acetaminophen (TYLENOL) tablet 650 mg  650 mg Oral Q4H PRN   ??? traMADol (ULTRAM) tablet 50 mg  50 mg Oral Q6H PRN   ??? piperacillin-tazobactam (ZOSYN) 3.375 g in 0.9% sodium chloride (MBP/ADV) 100 mL MBP  3.375 g IntraVENous Q8H  OBJECTIVE :     Visit Vitals  BP 142/63 (BP 1 Location: Right arm, BP Patient Position: Supine)   Pulse 61   Temp 99 ??F (37.2 ??C)   Resp 17   Ht 5\' 9"  (1.753 m)   Wt 78.9 kg (173 lb 15.1 oz)   SpO2 97%   BMI 25.69 kg/m??       Temp (24hrs), Avg:98.4 ??F (36.9 ??C), Min:97.3 ??F (36.3 ??C), Max:99 ??F (37.2 ??C)    GEN - 76yr old well built caucasian male not in distress lying in bed  RESP- ctab  CVS- s1s2 normal, no murmur   ABD-soft, NT, bs present   EXT-no edema   SKIN -no rash   NEURO - awake, alert, O x3    Labs: Results:   Chemistry Recent Labs     09/18/17  0302 09/17/17  0236 09/15/17  2100   GLU 127* 181* 382*   NA 143 141 133*   K 4.0 3.2* 5.0   CL 111* 111* 101   CO2 26 23 23    BUN 12 11 18    CREA 0.9 0.8 1.4*   CA 8.3* 7.6* 8.8   AGAP 7 8 9    AP  --   --  98   TP  --   --  6.9   ALB  --   --  3.1*      CBC w/Diff Recent Labs     09/18/17  0302 09/17/17  0236 09/15/17  2100   WBC 11.1* 13.7* 35.3*   RBC 3.98 3.84 4.32   HGB 11.2* 10.9* 12.4   HCT 35.2* 34.1* 38.5    PLT 289 278 361   GRANS 55.1 79.9* 84.9*   LYMPH 18.0* 8.0* 7.6*   EOS 16.8* 6.8* 0.5          RADIOLOGY :    CXR 11/12 - IMPRESSION:  Patient is status post sternotomy. Heart size is upper normal. There is  bibasilar atelectatic change. No acute pulmonary infiltrate. Diffuse sclerotic  osseous metastatic disease is seen throughout all the visualized bony  structures.      Felicity Pellegrini, MD  September 18, 2017  [facility]  [phone]

## 2017-09-18 NOTE — Other (Deleted)
PHYSICIAN???S DOCUMENTATION REQUEST   This Form is Not a Permanent Document in the Medical Record         By submitting this query, we are merely seeking further clarification of documentation to accurately reflect all conditions that you are monitoring, evaluating, treating or that extend the hospitalization or utilize additional resources of care. Please utilize your independent clinical judgment when addressing the question(s) below.    Dear Dr. Dory Peru,    The medical record contains the following conflicting diagnoses:      Documentation by Hospitalist in H&P on 09/16/17  states: Acute renal insufficiency, baseline 1.0, Cr 1.4. Patient admitted with severe sepsis with lactic acidosis.     Documentation by Urology on 09/17/17  States: AKI with baseline Creatinine 1.0. Peak Creatinine 1.4 (11/12).    Creatinine:  09/15/17  1.4  09/17/17  0.0  09/18/17  0.9    Treatment given : 2 liters normal saline in ED then Normal saline 100 ml/hr.     It is unclear whether the patient has a diagnosis of Acute renal insufficiency  or Acute kidney injury.  ------------------------------------------------------------------------------------------------------------------    As the Attending Physician for this patient, please help to clarify the most appropriate diagnosis for this patient.      PLEASE DOCUMENT ANY ADDITIONAL DIAGNOSES AND/OR SPECIFICITY IN THE PROGRESS NOTES AND/OR DISCHARGE SUMMARY.    Thank you,    Ladell Heads, RN, BSN, [place]   Clinical Documentation Specialist  [facility]   Office: [phone]  Cell: [phone]

## 2017-09-18 NOTE — Progress Notes (Signed)
DISCHARGE SUMMARY from Nurse    The following personal items are in your possession at time of discharge:       Visual Aid: Glasses, With patient           Clothing: Footwear, Belt, Pants, Socks  Other Valuables: Eyeglasses         PATIENT INSTRUCTIONS:    Notify your Physician if you experience:  ?? Increased Shortness of Breath  ?? Dizziness  ?? Fainting  ?? Black Stools  ?? Vomiting  ?? Coughing White, Frothy or Bloody Sputum  ?? Dry cough that will not go away  ?? Increased swelling of arms, legs, ankles or abdomen  ?? Develop a rash  ?? Difficulty breathing while laying down  ?? Urine problems: pain, burning, urgency or difficulty  ?? Temperature greater than 100 degrees F, shaking, chills for more than 24 hours  ?? Increased skin bruising  ?? Sore Mouth, throat or gums  ?? Increased cough, fatigue  ?? Clicking/popping sound in a joint  ?? Increased limb shortening or turning outward        Call your Doctor right away if you have new symptoms such as:  ?? Cough that is worse at night and when you are lying down  ?? Swelling in your legs, ankles, feet, abdomen and/or veins in the neck      Lifestyle Tips:  Appointment after discharge and follow up phone call:   After being discharged, if your appointment does not work for you, please feel free to contact the physician's office to change the appointment.       Medications:   Take your medicines exactly as instructed.   Ask your doctor or nurse if you have questions about your medicines.   Tell your doctor right away if you have problems with your medicines.  Activity:   Ask your doctor how active you should be.  Remember to take rest breaks.   Do not cross your legs when sitting.   Elevate your legs when you can.  Diet:   Follow any special diet orders from your doctor.   Ask your doctor how much salt (sodium) you should have each day.   Ask if you need to limit the amount or types of fluids you drink each day.  Weight Monitoring:    If you are overweight, ask about a weight loss plan that is right for you.   Weight gain may mean that fluids are building up in your body.   Weigh yourself every day at about the same time and write it down.  Do Not Smoke:   If you smoke, quit.  Smoking is bad for your health.   Avoid second hand smoke.   Call the [facility] for help at ([phone] (1-800-Quit - Now)  Other Tips:   Avoid people with the flu or pneumonia   Keep all of your doctor appointments   Carry a list of your medications, allergies and the shots you have had            These are general instructions for a healthy lifestyle:    No smoking/ No tobacco products/ Avoid exposure to second hand smoke    Surgeon General's Warning:  Quitting smoking now greatly reduces serious risk to your health.    Obesity, smoking, and sedentary lifestyle greatly increases your risk for illness    A healthy diet, regular physical exercise & weight monitoring are important for maintaining a healthy lifestyle    You  may be retaining fluid if you have a history of heart failure or if you experience any of the following symptoms:  Weight gain of 3 pounds or more overnight or 5 pounds in a week, increased swelling in our hands or feet or shortness of breath while lying flat in bed.  Please call your doctor as soon as you notice any of these symptoms; do not wait until your next office visit.    Recognize signs and symptoms of STROKE:    F-face looks uneven    A-arms unable to move or move unevenly    S-speech slurred or non-existent    T-time-call 911 as soon as signs and symptoms begin-DO NOT go       Back to bed or wait to see if you get better-TIME IS BRAIN.    Warning Signs of HEART ATTACK     Call 911 if you have these symptoms:  ? Chest discomfort. Most heart attacks involve discomfort in the center of the chest that lasts more than a few minutes, or that goes away and comes back. It can feel like uncomfortable pressure, squeezing, fullness, or pain.   ? Discomfort in other areas of the upper body. Symptoms can include pain or discomfort in one or both arms, the back, neck, jaw, or stomach.  ? Shortness of breath with or without chest discomfort.  ? Other signs may include breaking out in a cold sweat, nausea, or lightheadedness.  Don't wait more than five minutes to call 911 ??? MINUTES MATTER! Fast action can save your life. Calling 911 is almost always the fastest way to get lifesaving treatment. Emergency Medical Services staff can begin treatment when they arrive ??? up to an hour sooner than if someone gets to the hospital by car.     Myself and/or my family have received education about my diagnosis throughout my hospital stay.  During my hospital stay, I and/or my family/caregiver were included in planning my care upon discharge.  My needs were taken into consideration and I was included in my discharge planning.  I had an opportunity to ask questions.      The discharge information has been reviewed with the patient.  The patient verbalized understanding.    Discharge Caregiver Notified of Impending Discharge: Yes     Discharge Plan/Follow Up Care Reviewed: No       Discharge medications reviewed with the patient and appropriate educational materials and side effects teaching were provided.

## 2017-09-18 NOTE — Discharge Summary (Signed)
DISCHARGE SUMMARY     Patient Name: John Higgins  Medical Record Number: [id number]  Date of Birth: Sep 05, 1941  Discharge Provider: Clearnce Sorrel, MD  Primary Care Provider: Anne Fu, MD    Admit date: 09/15/2017  Discharge Date:  04-Oct-2017 Time: 12:29 PM  Discharge Disposition: Home  Code Status: Prior    Follow-up appointments:     Follow-up Information     Follow up With Specialties Details Why Contact Info    Anne Fu, MD Family Practice   [address]  [place] [address]  [phone]             Follow-up recommendations:   Follow-up with primary care physician and urology  Discharge diagnosis:  1. Primary diagnosis           Sepsis [facility])                    Hospital Problems as of 2017/10/04 Date Reviewed: 10/04/2017          Codes Class Noted - Resolved POA    Uncontrolled diabetes mellitus ([place]) ICD-10-CM: E11.65  ICD-9-CM: 250.02  09/15/2017 - Present Unknown        * (Principal) Sepsis ([place]) ICD-10-CM: A41.9  ICD-9-CM: 038.9, 995.91  09/15/2017 - Present Unknown        Acute kidney injury ([place]) ICD-10-CM: N17.9  ICD-9-CM: 584.9  09/15/2017 - Present Unknown                                            Past Medical History:   Diagnosis Date   ??? CAD (coronary artery disease) 2005, 2015    CABG 2005, multiple stents, most recently in 05/2014   ??? Diabetes ([place])    ??? DM (diabetes mellitus screen)    ??? GERD (gastroesophageal reflux disease)    ??? HTN (hypertension)    ??? Liver disorder    ??? Prostate cancer ([place])     [id number] Prostate Cancer w/ bone mets   ??? Thyroid disease                                                         Discharge Medications:                 Current Discharge Medication List      START taking these medications    Details   levoFLOXacin (LEVAQUIN) 500 mg tablet Take 1 Tab by mouth daily.  Qty: 10 Tab, Refills: 0         CONTINUE these medications which have NOT CHANGED    Details   abiraterone (ZYTIGA) 250 mg tab Take four tablets by mouth daily on an  empty stomach.  Take one hour prior to food or two hours after food. Tablets should be swallowed whole with water.  Do not crush or chew tablets.  Indications: metastatic castration-resistant prostate cancer  Qty: 120 Tab, Refills: 11      predniSONE (DELTASONE) 5 mg tablet Take 1 Tab by mouth two (2) times a day.  Qty: 60 Tab, Refills: 11      metoprolol succinate (TOPROL-XL) 50 mg XL tablet daily.      nitroglycerin (NITROSTAT)  0.4 mg SL tablet       isosorbide dinitrate (ISORDIL) 30 mg tablet Take 30 mg by mouth four (4) times daily.      gabapentin (NEURONTIN) 300 mg capsule two (2) times a day.      glimepiride (AMARYL) 1 mg tablet every evening.      simvastatin (ZOCOR) 40 mg tablet nightly.      tamsulosin (FLOMAX) 0.4 mg capsule every evening.      clopidogrel (PLAVIX) 75 mg tablet daily.      METFORMIN HCL (METFORMIN PO) Take  by mouth.      LEVOTHYROXINE SODIUM (LEVOTHYROXINE PO) Take  by mouth.      losartan (COZAAR) 50 mg tablet Take  by mouth daily.      aspirin 81 mg tablet Take 81 mg by mouth.         STOP taking these medications       Sipuleucel-T in LR (PROVENGE) susp Comments:   Reason for Stopping:               Discharge Diet:            Diet: Cardiac Diet and Diabetic Diet    Consultants: ID, Hematology/Oncology and Urology    Procedures:   * No surgery found *        Most Recent BMP and CBC:    Recent Labs     09/18/17  0302  09/15/17  2100   BUN 12   < > 18   NA 143   < > 133*   CO2 26   < > 23   ALT  --   --  31    < > = values in this interval not displayed.     Recent Labs     09/18/17  0302   WBC 11.1*   RBC 3.98   HCT 35.2*   MCV 88.4   MCH 28.1   MCHC 31.8       Imaging:    Nm Bone Scan Wh Body    Result Date: 08/26/2017  SEND REPORT TO: Daylene Katayama, [place] [address] FAX:  [phone] Name:  John Higgins DOB:  May 18, 1941  Gender:  Male    Exam Date:  August 26, 2017 Referring  Provider: Daylene Katayama MD NUCLEAR MEDICINE WHOLE BODY BONE SCINTIGRAPHY HISTORY: 76 year old male SYMPTOMS: Prostate cancer REFERRING DIAGNOSIS: Evaluate metastatic disease RADIOPHARMACEUTICAL: Tc-63mMDP 26.3 mCi IV POST-INJECTION IMAGING DELAY: 3 hr COMPARISON: Comparison made to the previous whole-body bone scan of 05/29/2017. FINDINGS: BONE LESIONS: Numerous osseous metastatic foci are again noted.  There is a focus of increased radiotracer localization seen within the proximal shaft of the humerus suggesting metastatic disease, new since the previous study.  A focus of increased radiotracer localization seen within the L1 vertebral body is also new.  A subtle focus of increased radiotracer localization seen within the intertrochanteric region of the proximal right femur is new since the previous study.  There is interval increase in radiotracer localization seen within multiple foci previously noted including the left intertrochanteric region, right acetabulum, left superior pubic ramus, several bilateral rib lesions, several lesions within the thoracic spine, pelvic and SI joint regions bilaterally, right humeral head, left clavicle and xyphoid process suggesting progression of osseous metastatic disease.  A small focus of increased radiotracer localization is seen along a right anterior mid to lower rib.  This is nonspecific although commonly reflects  post-traumatic phenomenon.               SKULL: There are approximately zero metastatic foci, as described above.               THORAX: There are approximately 15 metastatic foci, as described above.               SPINE: There are approximately 41 metastatic foci, as described above.               PELVIS: There are approximately 13 metastatic foci, as described above.               EXTREMITIES: There are approximately 13 metastatic foci, as described above. SOFT TISSUE CONTOURS:  Normal. KIDNEYS: There remains  diminished kidney visualization suggestive of a Superscan. OTHER FINDINGS: Radiotracer localization suggesting degenerative changes again noted within the shoulders, knees and feet/ankles bilaterally. IMPRESSION: Please see discussion above. Thank you for this referral.     Interpreting Radiologist:  Octavia Heir M.D.                             Electronically signed: Octavia Heir  08/27/2017 Name:  John Higgins                             NM Bone Study                            Exam Date:  August 26, 2017 ~~This report was copied and pasted from the servicing EHR system.~~     Xr Chest Pa Lat    Result Date: 09/15/2017  INDICATION: fever; prostate cancer.  Dyspnea TECHNIQUE: PA/lateral Chest Radiograph. COMPARISON: 05/24/2009     IMPRESSION: Patient is status post sternotomy. Heart size is upper normal. There is bibasilar atelectatic change. No acute pulmonary infiltrate. Diffuse sclerotic osseous metastatic disease is seen throughout all the visualized bony structures.     Ct Abd Pelv W Wo Cont    SEND REPORT TO: Daylene Katayama, [place] [address] FAX:  [phone] Name:  John Higgins DOB:  1940-11-17  Gender:  Male    Exam Date:  August 26, 2017 Referring Provider: Daylene Katayama MD CT STUDY OF THE ABDOMEN AND PELVIS HISTORY: 58-year all man SYMPTOMS: Prostate cancer REFERRING DIAGNOSIS: Malignant neoplasm of prostate TECHNIQUE: Helical multislice.  All MRI \\T\\amp; CT Diagnostics CT scans are performed using one of these three dose reduction techniques: automated exposure control, adjustment of the mA and/or kV according to patient size, or use of iterative reconstruction techniques. ISTAT IN HOUSE: 08/26/17 REASON:               AGE, HTN, DM              CREAT:  0.9   eGFR: 74.8              CONTRAST DECISION:  100cc IV Isovue COMPARISON:  CT May 29, 2017 FINDINGS:  Lung bases:  Within normal  limits. CABG surgery. Liver: Within normal limits. Biliary tree: Within normal limits. Gallbladder: Surgically absent. Spleen: Within normal limits. Pancreas: Distal pancreatectomy. Adrenals: Within normal limits. Kidneys/ureters: There is an approximate 4.4 cm exophytic simple cyst projecting posteriorly from the mid left kidney. There is a 1.6 cm cyst on the  anteromedial lower left kidney. There is a 2.8 cm simple cyst projecting laterally from the lower right kidney. Bowel: Small sliding hiatal hernia. No bowel obstruction or inflammation is seen. The appendix is seen in the central abdomen mildly extending to the left side and appears normal. Aorta/vasculature: The aorta shows atherosclerotic calcification without significant aneurysm. Pelvis: Within normal limits. Miscellaneous: No pathologically enlarged lymph nodes or ascites is seen. Bilateral inguinal fat-containing hernias, larger on the left side.. 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 There is soft tissue attenuation in the pelvic ventral subcutaneous fat, more so on the left side. Measureable lymph nodes per RECIST 1.1: NONE 1. location, bi-diameter measurement 2. location, bi-diameter measurement 3. location, bi-diameter measurement 4. location, bi-diameter measurement NEW 1 location, bi-diameter measurement NEW 2 location, bi-diameter measurement IMPRESSION: Please see above. No significant change. Thank you for this referral.   Interpreting Radiologist:  Dayton Martes M.D.                             Electronically signed: Dayton Martes  08/29/2017 Name:  [name]                             CT Abd/Pelv                            Exam Date:  August 26, 2017 ~~This report was copied and pasted from the servicing EHR system.~~       Echo Results  (Last 48 hours)    None          History of presenting illness:( Per admitting M.D.)  John Higgins is a 76 y.o. year old male with hx of Prostate cancer  with mets to bone who presents with shaking, fevers and chills, some nausea and vomiting, no cough, no diarrhea, pt has had similar episodes in past. Patient currently on treatment for prostate cancer. Patient febrile and tachycardic, has elevated WBC 35, lactic acidosis and new renal insufficiency, he does however take prednisone, CXR without infiltrate, UA -ve for infection, Flu swab pending, started on broad spectrum ABX Vancomycin and Zosyn, panCx pending. Hematology/Onc consulted.  ??        Hospital course:   Severe??Sepsis??with lactic acidosis??  Uncontrolled type 2 diabetes mellitus with hyperglycemia   AKI, poa  Lactic acidosis   CAD  DM, non insulin dependent, uncontrolled with hyperglycemia??  GERD  HTN  Thyroid disease   Hx of Prostate cancer??  ??  Treatment plan  Patient admitted to hospital with sepsis.  Likely source of sepsis is contaminated transfusion products during immunotherapy .  Patient started on IV vancomycin and Zosyn.  Infectious disease consulted.  Patient responded well to treatment.  Leukocytosis improved.  Fever improved.  Blood and urine culture negative.  Discussed with infectious disease recommended okay to discharge on 10 days of Levaquin.  Case also discussed with urology.  They will keep follow-up as outpatient.  Case discussed with Dr. Edd Arbour oncologist he recommends patient gets treatment with urology.  They have nothing to add at this time.  Diabetes stable  Patient insisting to go home.  Recommended to follow-up with PCP and urology.  Return to hospital if any worsening symptoms.    Vitals on Discharge:  Visit Vitals  BP 136/68 (BP 1 Location: Left arm, BP Patient Position:  Sitting)   Pulse 67   Temp 98.3 ??F (36.8 ??C)   Resp 18   Ht 5' 9"  (1.753 m)   Wt 78.9 kg (173 lb 15.1 oz)   SpO2 100%   BMI 25.69 kg/m??       Physical Exam:  Constitutional:??No acute distress  HENT:??atraumatic, normocephalic, oropharynx clear ??  Eyes:??????conjunctiva normal  Neck:??????supple and trachea normal   Cardiovascular:?? Regular rate and rhythm, heart sounds normal, intact distal pulses  Pulmonary/Chest Wall: breath sounds normal and effort normal  Abdominal:????????????appearance normal, soft, non-tender upon palpation,bowel sounds normal.  Neurological:??????awake, alert and oriented, CN intact, no focal neuro deficits, moves all extremities equally .  Extremities:???? No clubbing or cyanosis. Pedal pulses 2+ b/l.       Discharge condition:  improved    Discharge activity and restrictions: Activity as tolerated    Time spent with discharging patient:   Discharge plan discussed with pt. All questions answered. Need for compliance with medicine and follow up emphasized. Pt verbalized the understanding of care. Time spent 35 minutes.      Clearnce Sorrel, MD  09/18/2017  12:29 PM    Copies: Anne Fu, MD

## 2017-09-18 NOTE — Progress Notes (Signed)
Discharge Plan:   Home w/family assistance.    Discharge Date:     09/18/2017     [facility] given : Y    Home Health Needed:     No    DME needed and ordered for Discharge:    No     Transportation: Family and friend, this evening.

## 2017-09-18 NOTE — Progress Notes (Addendum)
Urology Progress Note        Assessment/Plan:     Patient Active Problem List   Diagnosis Code   ??? Prostate cancer metastatic to multiple sites [facility]) C61   ??? Bone metastases ([place]) C79.51   ??? Uncontrolled diabetes mellitus ([place]) E11.65   ??? Sepsis ([place]) A41.9   ??? Acute kidney injury ([place]) N17.9       Assessment:   - [id number] Prostate Cancer with Gleason 5+5 w/ bone mets:??[facility]CRPC  ????????????????????????- Currently on Provenge (completed 3 cycles last cycle 11/12),??Zytiga and Prednisone. Followed by Dr. Gearldine Bienenstock   - Sepsis - fever,shaking chills, leukocytosis  - improving   - AKI with baseline creatinine 1.0  ????????????????????????- Peak creatinine 1.4 (11/12)  ????????????????????????- Current creatinine - 0.9  - DM2  - Significant CAD history s/p CABG, multiple stents??  ??  ??  PLAN:????  - Discussed with Dr. Gearldine Bienenstock, concern for Provenge infusion itself being contaminated as patient does not have any ports / lines.   - Blood pending - shows no growth to date - Urine culture shows no growth   - Appreciate hospitalist, infectious disease, and oncology's help in managing this patient   - Ok for patient to bring home dose of [place] from GU standpoint for discharge   - Will send message to Johann Capers to arrange follow up with Dr. Given in 2 weeks.   ??  ??  Donia Pounds, PA-C  [facility]   Pager??Monday-Friday 8am-5pm [phone]  If after hours please call [phone]      GU Staff:  I have independently interviewed and examined this patient.  I agree with the above documentation and agree with plans for continued outpatient FU.    Earnest Bailey, MD        Subjective:     Daily Progress Note: 09/18/2017 9:15 AM    John Higgins is doing well, very eager to go home. He reports pain is well controlled. He has no new complaints. He is tolerating a solid diet and ambulating without assistance. Patient is voiding without difficulty.    Objective:     Visit Vitals  BP 142/63 (BP 1 Location: Right arm, BP Patient Position: Supine)   Pulse 61    Temp 99 ??F (37.2 ??C)   Resp 17   Ht 5\' 9"  (1.753 m)   Wt 78.9 kg (173 lb 15.1 oz)   SpO2 97%   BMI 25.69 kg/m??        Temp (24hrs), Avg:98.4 ??F (36.9 ??C), Min:97.3 ??F (36.3 ??C), Max:99 ??F (37.2 ??C)      Intake and Output:  11/13 1901 - 11/15 0700  In: 1478 [P.O.:720; I.V.:3330]  Out: 2375 [Urine:2375]  11/15 0701 - 11/15 1900  In: 240 [P.O.:240]  Out: 450 [Urine:450]    PHYSICAL EXAMINATION:   Visit Vitals  BP 142/63 (BP 1 Location: Right arm, BP Patient Position: Supine)   Pulse 61   Temp 99 ??F (37.2 ??C)   Resp 17   Ht 5\' 9"  (1.753 m)   Wt 78.9 kg (173 lb 15.1 oz)   SpO2 97%   BMI 25.69 kg/m??     Constitutional:??Well developed, well nourished male. ??No acute distress. ??  HEENT: Normocephalic, Atraumatic, EOM's intact   CV:????No peripheral edema noted??  Respiratory: No respiratory distress or difficulties breathing   Abdomen:????Soft, non-tender, non-distended??  GU Male:????No CVA tenderness??  Skin:??No evidence of jaundice. ??Normal color  Neuro/Psych:????Alert and oriented.??Affect appropriate.??  MSK: Normal ROM??  ??  ??  Lab/Data Review:  All lab results for the last 24 hours reviewed.        Labs:     Labs: Results:   Chemistry    Recent Labs     09/18/17  0302 09/17/17  0236 09/15/17  2100   GLU 127* 181* 382*   NA 143 141 133*   K 4.0 3.2* 5.0   CL 111* 111* 101   CO2 26 23 23    BUN 12 11 18    CREA 0.9 0.8 1.4*   CA 8.3* 7.6* 8.8   AGAP 7 8 9    AP  --   --  98   TP  --   --  6.9   ALB  --   --  3.1*      CBC w/Diff Recent Labs     09/18/17  0302 09/17/17  0236 09/15/17  2100   WBC 11.1* 13.7* 35.3*   RBC 3.98 3.84 4.32   HGB 11.2* 10.9* 12.4   HCT 35.2* 34.1* 38.5   PLT 289 278 361   GRANS 55.1 79.9* 84.9*   LYMPH 18.0* 8.0* 7.6*   EOS 16.8* 6.8* 0.5      Cultures No results for input(s): CULT in the last 72 hours.  All Micro Results     Procedure Component Value Units Date/Time    CULTURE, BLOOD [[id number]] Collected:  09/15/17 2100    Order Status:  Completed Specimen:  Blood Updated:  09/18/17 0720      Blood Culture Result No Growth At 48 Hours       CULTURE, BLOOD [[id number]] Collected:  09/15/17 2130    Order Status:  Completed Specimen:  Blood Updated:  09/18/17 0719     Blood Culture Result No Growth At 48 Hours       CULTURE, URINE [[id number]] Collected:  09/15/17 2120    Order Status:  Completed Specimen:  Urine Updated:  09/17/17 1008     Culture result No Growth       CULTURE, BLOOD [[id number]] Collected:  09/15/17 2030    Order Status:  Canceled Specimen:  Blood     CULTURE, BLOOD [[id number]] Collected:  09/15/17 2030    Order Status:  Canceled Specimen:  Blood             Urinalysis Color   Date Value Ref Range Status   09/15/2017 YELLOW YELLOW,STRAW   Final     Appearance   Date Value Ref Range Status   09/15/2017 CLEAR CLEAR   Final     Specific gravity   Date Value Ref Range Status   09/15/2017 1.015 1.005 - 1.030   Final     pH (UA)   Date Value Ref Range Status   09/15/2017 5.5 5.0 - 9.0   Final     Protein   Date Value Ref Range Status   09/15/2017 NEGATIVE NEGATIVE,Negative mg/dl Final     Ketone   Date Value Ref Range Status   09/15/2017 NEGATIVE NEGATIVE,Negative mg/dl Final     Bilirubin   Date Value Ref Range Status   09/15/2017 NEGATIVE NEGATIVE,Negative   Final     Blood   Date Value Ref Range Status   09/15/2017 NEGATIVE NEGATIVE,Negative   Final     Urobilinogen   Date Value Ref Range Status   09/15/2017 0.2 0.0 - 1.0 mg/dl Final     Nitrites   Date Value Ref Range Status  09/15/2017 NEGATIVE NEGATIVE,Negative   Final     Leukocyte Esterase   Date Value Ref Range Status   09/15/2017 NEGATIVE NEGATIVE,Negative   Final     Potassium   Date Value Ref Range Status   09/18/2017 4.0 3.5 - 5.1 mEq/L Final     Creatinine   Date Value Ref Range Status   09/18/2017 0.9 0.6 - 1.3 mg/dl Final     BUN   Date Value Ref Range Status   09/18/2017 12 7 - 25 mg/dl Final     Prostate Specific Ag   Date Value Ref Range Status   07/16/2017 10.02  Final       PSA No results for input(s): PSA in the last 72 hours.   Coagulation No results found for: PTP, INR, APTT      ??  No GU imaging this admission??

## 2017-09-21 LAB — CULTURE, BLOOD
Blood Culture Result: NO GROWTH
Blood Culture Result: NO GROWTH

## 2017-09-22 ENCOUNTER — Institutional Professional Consult (permissible substitution): Admit: 2017-09-22 | Discharge: 2017-09-22 | Payer: MEDICARE | Primary: Family Medicine

## 2017-09-22 DIAGNOSIS — C7951 Secondary malignant neoplasm of bone: Secondary | ICD-10-CM

## 2017-09-22 MED ORDER — DENOSUMAB 120 MG/1.7 ML (70 MG/ML) SUB-Q
120 mg/1.7 mL (70 mg/mL) | Freq: Once | SUBCUTANEOUS | 0 refills | Status: AC
Start: 2017-09-22 — End: 2017-09-22

## 2017-09-22 MED ORDER — DEGARELIX 80 MG SUB-Q SOLN
80 mg | Freq: Once | SUBCUTANEOUS | 0 refills | Status: AC
Start: 2017-09-22 — End: 2017-09-22

## 2017-09-22 NOTE — Progress Notes (Signed)
John Higgins is a 76 y.o. male who is here today per the order of Dr. Given  to receive Degarelix and [name] Patient's identity has been verified.   Dr. Fatima Sanger  was available in the clinic as incident to provider.     Patient has not had any dental procedures since his last injection.  Patient denies any upcoming dental procedures.  Patient  denies jaw pain.  Patient wears DENTURES.     Patient has been taking supplements of at least 500 mg of calcium and 400 international units of Vitamin D daily.  This is not his first ADT injection.  This isnot  his first DENOSUMAB injection.  He has had the necessary labs drawn.  Patient had labs done on 09/17/2017.    [name] is administered to abdomen ( right) SQ without difficulty.     Patient tolerated the injection well.    Patient has been scheduled for the next injection which will be due in approximately 1 months    Degarelix  is administered to abdomen ( left) SQ without difficulty.     Patient tolerated the injection well.    Patient has been scheduled for the next injection which will be due in approximately 1 months  Patient has a follow-up appointment with provider on September 30, 2017      Encounter Diagnoses   Name Primary?   ??? Bone metastases ([place]) Yes   ??? Prostate cancer metastatic to multiple sites [facility])        Orders Placed This Encounter   ??? THER/PROPH/DIAG INJECTION, SUBCUT/IM   ??? CHEMOTHER HORMON ANTINEOPL SUB-Q/IM   ??? PR DEGARELIX INJECTION     Order Specific Question:   Charge Quantity?     Answer:   82     Order Specific Question:   Dose     Answer:   80 mg     Order Specific Question:   Site     Answer:   LEFT LOWER QUAD ABDOMEN     Order Specific Question:   Expiration Date     Answer:   09/23/2019     Order Specific Question:   Lot#     Answer:   [id number]     Order Specific Question:   Manufacturer     Answer:   Arlana Lindau     Order Specific Question:   Perfomed by/Witnessed by:     Answer:   fh     Order Specific Question:   NDC#      Answer:   [address]-9 [629528]   ??? PR DENOSUMAB INJECTION     Order Specific Question:   Charge Quantity?     Answer:   120     Order Specific Question:   Dose     Answer:   120 mg     Order Specific Question:   Site     Answer:   RIGHT LOWER QUAD ABDOMEN     Order Specific Question:   Expiration Date     Answer:   09/23/2019     Order Specific Question:   Lot#     Answer:   [id number]     Order Specific Question:   Manufacturer     Answer:   [employer]     Order Specific Question:   Perfomed by/Witnessed by:     Answer:   fh     Order Specific Question:   NDC#     Answer:   [address]-536-64 [[id number]]   ??? degarelix (  FIRMAGON) 80 mg solr injection     Sig: 80 mg by SubCUTAneous route once for 1 dose.     Dispense:  1 Each     Refill:  0   ??? denosumab (XGEVA) soln injection     Sig: 1.7 mL by SubCUTAneous route once for 1 dose.     Dispense:  1.7 mL     Refill:  0       Patient did not supply own medication.      Axel Filler

## 2017-09-22 NOTE — Progress Notes (Signed)
I have reviewed the notes, assessments, and/or procedures performed by Tera Mater, I concur with her/his documentation of John Higgins.

## 2017-09-30 ENCOUNTER — Encounter: Primary: Family Medicine

## 2017-09-30 ENCOUNTER — Ambulatory Visit: Admit: 2017-09-30 | Discharge: 2017-09-30 | Payer: MEDICARE | Attending: Urology | Primary: Family Medicine

## 2017-09-30 DIAGNOSIS — C61 Malignant neoplasm of prostate: Secondary | ICD-10-CM

## 2017-09-30 LAB — AMB POC URINALYSIS DIP STICK AUTO W/O MICRO
Bilirubin (UA POC): NEGATIVE
Blood (UA POC): NEGATIVE
Leukocyte esterase (UA POC): NEGATIVE
Nitrites (UA POC): NEGATIVE
Protein (UA POC): NEGATIVE
Specific gravity (UA POC): 1.015 (ref 1.001–1.035)
Urobilinogen (UA POC): 0.2 (ref 0.2–1)
pH (UA POC): 5.5 (ref 4.6–8.0)

## 2017-09-30 NOTE — Progress Notes (Signed)
John Higgins  Feb 08, 1941  Impression:  Encounter Diagnoses     ICD-10-CM ICD-9-CM   1. Prostate cancer metastatic to multiple sites ([place]) C61 185     199.0   2. 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:   -Bone scan and CT scan on 04/2016 showed extensive osseous metastatic disease   - Initiated on Degerelix 05/14/16   -Started [name] 06/11/16 (pt edentulous, so no dental clearance needed), most recent injection 05/28/17   -Enrolled in ARCHES trial 06/21/16   -Bone scan and CT performed 12/12/16 - bone scan with improvement in bone mets   -Bone scan 03/11/17:  bone scan with stable number of bone mets, but increased activity in right superior pubic ramus, upper margin of the left SI joint, left intertrochanteric region, left scapula    - Bone scan 05/28/17 - There is interval development of a new metastatic focus within the right humeral head. There is also a new focus of increased  or tracer localization seen within the right hemisacrum near the SI joint suggesting additional metastatic disease. ??Since the previous study, there is  increasing radiotracer localization seen within the upper thoracic spine, right acetabulum, along the left SI joint, left superior pubic ramus and intertrochanteric region of the proximal left femur suggesting progression of metastatic disease.    Thoracic involvement documented as 15 foci, previously 40 foci in May.    -    Current Status:  CRPC m1b              - Discontinued ARCHES trial               - Completed Provenge on 09/15/2017             -Currently on Zytiga and Prednisone initiated 07/2017             -CT scan 08/26/17 was negative for metastatic    -Most recent PSA of 10.02 ng/mL as of 07/16/2017, previously 6.23 ng/mL  drawn in 07/03/2017. PSA today pending   -CT A/P 09/26/2017 no evidence of metastatic disease.   -Bone scan 09/26/2017 revealed multiple new osseous metastases.   Significant CAD history - CABG 2005, multiple coronary stents most recently 05/2014, managed by Dr Jerline Pain    Plan:  PSA and CMP drawn today, will fwd results  Continue Xgeva  Contine ADT, with Degarelix  Continue Zytiga and Prednisone  Continue Degarelix and XGeva, injections.  Continue Vit D and Ca supplements (Prosteon)  CMP monthly  FU in 3 months with SD PSA and CMP      Chief Complaint   Patient presents with   ??? Prostate Cancer       HPI: John Higgins, 76 y.o. male who presents today in follow up for prostate cancer.    Pt initially underwent prostate biopsy for PSA of 43. Biopsy positive for gleason 10 disease.  Bone scan showed extensive osseous metastatic disease with innumerable lesions of the axial and proximal appendicular skeleton. Pt initiated on Degarelix on 05/14/16. Initiated on XGeva 06/11/16.  Tolerating well, no SEs.  CT A/P 09/26/2017 no evidence of metastatic disease. Bone scan 09/26/2017 revealed multiple new osseous metastases.    Patient was recently hospitalized for sepsis, likely caused by last Provenge infusion. He has since completed abx.  He is currently feeling well.  Pt was initiated on Zytiga and Prednisone in 07/2017. Tolerating well. No SEs.    He notes  pain across the lower back. Has been ongoing for many years.  He notes lower right back pain. Currently taking Tylenol and Tramadol with some benefit.  No voiding complaints. Denies hematuria or dysuria.    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  Past Medical History:   Diagnosis Date   ??? CAD (coronary artery disease) 2005, 2015    CABG 2005, multiple stents, most recently in 05/2014   ??? Diabetes ([place])    ??? DM (diabetes mellitus screen)    ??? GERD (gastroesophageal reflux disease)    ??? HTN (hypertension)    ??? Liver disorder    ??? Prostate cancer ([place])     cT2bN0M1b Prostate Cancer w/ bone mets   ??? Thyroid disease          Past Surgical History:    Procedure Laterality Date   ??? CARDIAC SURG PROCEDURE UNLIST       cardiac bypass 2005 stent placement   ??? HX CATARACT REMOVAL Left 08/16/2015   ??? HX CHOLECYSTECTOMY  1974   ??? 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 HX SPLENECTOMY           Allergies   Allergen Reactions   ??? Naproxen Hives         Current Outpatient Medications:   ???  abiraterone (ZYTIGA) 250 mg tab, Take four tablets by mouth daily on an empty stomach.  Take one hour prior to food or two hours after food. Tablets should be swallowed whole with water.  Do not crush or Higgins tablets.  Indications: metastatic castration-resistant prostate cancer, Disp: 120 Tab, Rfl: 11  ???  predniSONE (DELTASONE) 5 mg tablet, Take 1 Tab by mouth two (2) times a day., Disp: 60 Tab, Rfl: 11  ???  metoprolol succinate (TOPROL-XL) 50 mg XL tablet, daily., Disp: , Rfl:   ???  nitroglycerin (NITROSTAT) 0.4 mg SL tablet, , Disp: , Rfl:   ???  isosorbide dinitrate (ISORDIL) 30 mg tablet, Take 30 mg by mouth four (4) times daily., Disp: , Rfl:   ???  gabapentin (NEURONTIN) 300 mg capsule, two (2) times a day., Disp: , Rfl:   ???  glimepiride (AMARYL) 1 mg tablet, every evening., Disp: , Rfl:   ???  simvastatin (ZOCOR) 40 mg tablet, nightly., Disp: , Rfl:   ???  tamsulosin (FLOMAX) 0.4 mg capsule, every evening., Disp: , Rfl:   ???  clopidogrel (PLAVIX) 75 mg tablet, daily., Disp: , Rfl:   ???  METFORMIN HCL (METFORMIN PO), Take  by mouth., Disp: , Rfl:   ???  LEVOTHYROXINE SODIUM (LEVOTHYROXINE PO), Take  by mouth., Disp: , Rfl:   ???  losartan (COZAAR) 50 mg tablet, Take  by mouth daily., Disp: , Rfl:   ???  aspirin 81 mg tablet, Take 81 mg by mouth., Disp: , Rfl:   ???  levoFLOXacin (LEVAQUIN) 500 mg tablet, Take 1 Tab by mouth daily., Disp: 10 Tab, Rfl: 0    History reviewed. No pertinent family history.  Social History     Socioeconomic History   ??? Marital status: DIVORCED     Spouse name: Not on file   ??? Number of children: Not on file   ??? Years of education: Not on file    ??? Highest education level: Not on file   Tobacco Use   ??? Smoking  status: Former Smoker   ??? Smokeless tobacco: Never Used   Substance and Sexual Activity   ??? Alcohol use: No   ??? Drug use: No     Any elements of the PMH, FH, SHx, ROS, or preliminary elements of the HPI that were entered by a medical assistant have been reviewed in full.    Review of Systems  Constitutional: Fever: No  Skin: Rash: No  HEENT: Hearing difficulty: No  Eyes: Blurred vision: No  Cardiovascular: Chest pain: No  Respiratory: Shortness of breath: No  Gastrointestinal: Nausea/vomiting: No  Musculoskeletal: Back pain: Yes  Neurological: Weakness: No  Psychological: Memory loss: No  Comments/additional findings:       PE:  Visit Vitals  BP 120/68   Ht 5' 9"  (1.753 m)   Wt 173 lb (78.5 kg)   BMI 25.55 kg/m??     Constitutional: WDWN, Pleasant and appropriate affect, No acute distress.    GU: no CVA tenderness   CV: no peripheral edema  Resp: normal respiratory effort, lungs CTAB  ABD: non-distended  Musc:normal gait and station  Neuro: CN II-XII grossly intact.   Psych: normal affect, well groomed, appropriate answers, A&Ox3      Lab Results   Component Value Date/Time    Sodium 143 09/18/2017 03:02 AM    Potassium 4.0 09/18/2017 03:02 AM    Chloride 111 (H) 09/18/2017 03:02 AM    CO2 26 09/18/2017 03:02 AM    Anion gap 7 09/18/2017 03:02 AM    Glucose 127 (H) 09/18/2017 03:02 AM    BUN 12 09/18/2017 03:02 AM    Creatinine 0.9 09/18/2017 03:02 AM    BUN/Creatinine ratio 10 09/01/2017 04:08 PM    GFR est AA >60 09/18/2017 03:02 AM    GFR est non-AA >60 09/18/2017 03:02 AM    Calcium 8.3 (L) 09/18/2017 03:02 AM    Bilirubin, total 0.8 09/15/2017 09:00 PM    AST (SGOT) 47 (H) 09/15/2017 09:00 PM    Alk. phosphatase 98 09/15/2017 09:00 PM    Protein, total 6.9 09/15/2017 09:00 PM    Albumin 3.1 (L) 09/15/2017 09:00 PM    A-G Ratio 1.5 09/01/2017 04:08 PM    ALT (SGPT) 31 09/15/2017 09:00 PM     Bone Scan 08/26/2017:   BONE LESIONS: Numerous osseous metastatic foci are again noted. ??There is a focus of increased radiotracer localization seen within the proximal shaft of the humerus suggesting metastatic disease, new since the previous study. ??A focus of increased radiotracer localization seen within the L1 vertebral body is also new. ??A subtle focus of increased radiotracer localization seen within the intertrochanteric region of the proximal right femur is new since the previous study. ??There is interval increase in radiotracer localization seen within multiple foci previously noted including the left intertrochanteric region, right acetabulum, left superior pubic ramus, several bilateral rib lesions, several lesions within the thoracic spine, pelvic and SI joint regions bilaterally, right humeral head, left clavicle and xyphoid process suggesting progression of osseous metastatic disease. ??A small focus of increased radiotracer localization is seen along a right anterior mid to lower rib. ??This is  nonspecific although commonly reflects post-traumatic phenomenon.     ???? ?? ?? ?? ?? ?? ??SKULL: There are approximately zero metastatic foci, as described above.    ???? ?? ?? ?? ?? ?? ??THORAX: There are approximately 15 metastatic foci, as described above.     ???? ?? ?? ?? ?? ?? ??SPINE: There are approximately 41 metastatic foci,  as described above.    ???? ?? ?? ?? ?? ?? ??PELVIS: There are approximately 13 metastatic foci, as described above.    ???? ?? ?? ?? ?? ?? ??EXTREMITIES: There are approximately 13 metastatic foci, as described above.    SOFT TISSUE CONTOURS: ??Normal.     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     OTHER FINDINGS: Radiotracer localization suggesting degenerative changes again noted within the shoulders, knees and feet/ankles bilaterally.     CT A/P WO Cont 08/26/2017:  Kidneys/ureters:  There is an approximate 4.4 cm exophytic simple cyst projecting posteriorly from the mid left kidney.   There is a 1.6 cm cyst on the anteromedial lower left kidney.  There is a 2.8 cm simple cyst projecting laterally from the lower right kidney.    Bowel:  Small sliding hiatal hernia.  No bowel obstruction or inflammation is seen. The appendix is seen in the central abdomen mildly extending to the left side and appears normal.    Aorta/vasculature:  The aorta shows atherosclerotic calcification without significant aneurysm.    Pelvis:  Within normal limits.     CT A/P WO cont 04/19/2016;  Impression:   1. Findings concerning for new osseous metastasis.  Bone scan recommended for further assessment.  2.  Interval postsurgical changes of the distal pancreas and spleen.  3.  No obvious metastasis to the liver, though evaluation is limited on a noncontrast study.  No significant abdominal or pelvic adenopathy.  4.  Small fat containing a focal hernia and patulous left inguinal ring.  5.  Enlarged prostate.    -Bone Scan 05/01/2016  Impression:  Extensive osseous metastatic disease with innumerable lesions of the axial and proximal appendicular skeleton.    Prostate Biopsy 05/14/16:  SURGICAL PATHOLOGY REPORT   DRE: Prostate 40 grams and nodule at the left apex   Last PSA Result: Rising ~ 43.03 on 04/15/2016     ??   DIAGNOSIS:   A. Right Apex Needle biopsy ??   ?? ?? ?? ?? ?? ?? 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 ?? ?? ?? ?? ?? ??(1 of 1 core(s) positive). Grade Group 5. Tumor involves one end.   B. Right Mid Needle biopsy ??   ?? ?? ?? ?? ?? ?? ??ADENOCARCINOMA, GLEASON SCORE 5 + 5 = 10 involving 40 % of the specimen (1 of 1 core(s) positive).   ?? ?? ?? ?? ?? ?? ??Grade Group 5. Tumor involves one end.   C. Right Base Needle biopsy ??   ?? ?? ?? ?? ?? ?? ??ADENOCARCINOMA, GLEASON SCORE 5 + 4 = 9 involving 3 % of the specimen (1 of 1 core(s) positive).   ?? ?? ?? ?? ?? ?? ??Grade Group 5. Ends not involved by the tumor.   D. Left Apex Needle biopsy ??   ?? ?? ?????? ?? ?? ??Benign prostatic tissue.    E. Left Mid Needle biopsy ??   ?? ?? ?? ?? ?? ?? ??Benign prostatic tissue.   F. Left Base Needle biopsy ??   ?? ?? ?? ?? ?? ?? ??Benign prostatic tissue.         Herbie [place] Alyx Mcguirk MD   Urologic Oncologist  [facility] Hermelinda Medicus MD Professorship  Professor of Urology  [facility]  Office [phone] Ext [phone]        VE:LFYB,  Earlie Server, MD      Medical documentation provided with the assistance of Georges Mouse, medical scribe to Okey Regal Vasilis Luhman, MD on 09/30/2017.

## 2017-09-30 NOTE — Telephone Encounter (Signed)
Monthly Questionnaire for [facility] [employer]  Are you taking your medication, Zytiga as prescribed?  yes     If on Zytiga, when was the last CMP done? 09/17/17    If on Zytiga, are you taking your prednisone as prescribed? yes    Have you experienced any new or worsening side effects from this medication in the last 30 days?  no     Have you experienced any new or worsening pain in the last 30 days?  no   If so, where?      Pain severity:       Have you had any recent cardiac issues in the last 30 days? (arrythmias, heart attack, chest pain, cardiac catheterizations, cardiac surgeries, cardiac related hospitalizations) yes   Do you have a cardiologist? yes    If so, name of Cardiologist Dr. Jerline Pain [phone]     Have you experienced any changes in your ability to perform your usual daily activities in the last 30 days?  no             Have you experienced any significant changes in your weight or appetite in the last 30 days?  no    Have you experienced any significant changes in your ability to sleep in the last 30 days?  no    Have you experienced any increase in fatigue over the last 30 days?  no    Patient's next appointment is scheduled for 09/30/17.    Patient has agreed to pick-up their medication on 09/30/17 and I have confirmed he has enough to last until then.

## 2017-10-02 LAB — METABOLIC PANEL, COMPREHENSIVE
A-G Ratio: 1.7 (ref 1.2–2.2)
ALT (SGPT): 17 IU/L (ref 0–44)
AST (SGOT): 18 IU/L (ref 0–40)
Albumin: 4.4 g/dL (ref 3.5–4.8)
Alk. phosphatase: 100 IU/L (ref 39–117)
BUN/Creatinine ratio: 18 (ref 10–24)
BUN: 17 mg/dL (ref 8–27)
Bilirubin, total: 0.5 mg/dL (ref 0.0–1.2)
CO2: 21 mmol/L (ref 20–29)
Calcium: 10.4 mg/dL — ABNORMAL HIGH (ref 8.6–10.2)
Chloride: 98 mmol/L (ref 96–106)
Creatinine: 0.96 mg/dL (ref 0.76–1.27)
GFR est AA: 87 mL/min/{1.73_m2} (ref >59)
GFR est non-AA: 75 mL/min/{1.73_m2} (ref >59)
GLOBULIN, TOTAL: 2.6 g/dL (ref 1.5–4.5)
Glucose: 352 mg/dL — ABNORMAL HIGH (ref 65–99)
Potassium: 5.4 mmol/L — ABNORMAL HIGH (ref 3.5–5.2)
Protein, total: 7 g/dL (ref 6.0–8.5)
Sodium: 136 mmol/L (ref 134–144)

## 2017-10-02 LAB — PROSTATE SPECIFIC ANTIGEN, TOTAL (PSA): Prostate Specific Ag: 14.14 ng/mL — ABNORMAL HIGH (ref 0.00–4.00)

## 2017-10-07 ENCOUNTER — Encounter: Attending: Urology | Primary: Family Medicine

## 2017-10-21 ENCOUNTER — Institutional Professional Consult (permissible substitution): Admit: 2017-10-21 | Discharge: 2017-10-21 | Payer: MEDICARE | Primary: Family Medicine

## 2017-10-21 DIAGNOSIS — C61 Malignant neoplasm of prostate: Secondary | ICD-10-CM

## 2017-10-21 MED ORDER — DENOSUMAB 120 MG/1.7 ML (70 MG/ML) SUB-Q
120 mg/1.7 mL (70 mg/mL) | Freq: Once | SUBCUTANEOUS | 0 refills | Status: AC
Start: 2017-10-21 — End: 2017-10-21

## 2017-10-21 MED ORDER — DEGARELIX 80 MG SUB-Q SOLN
80 mg | Freq: Once | SUBCUTANEOUS | 0 refills | Status: AC
Start: 2017-10-21 — End: 2017-10-21

## 2017-10-21 NOTE — Progress Notes (Signed)
John Higgins is a 76 y.o. male who is here today per the order of Dr. Given  to receive Degarelix and [name]  and to have labs drawn.   Patient's identity has been verified.   Dr. Gearldine Bienenstock  was available in the clinic as incident to provider.     Patient has not had any dental procedures since his last injection.  Patient denies any upcoming dental procedures.  Patient  denies jaw pain.  Patient wears DENTURES.     Patient has  been taking supplements of at least 500 mg of calcium and 400 international units of Vitamin D daily.  This is not his first ADT injection.  This is not his first DENOSUMAB injection.  He has had the necessary labs drawn.  PSA, TST and CMP obtained via venipuncture without any difficulty.  Patient will be notified with lab results.     Degarelix  is administered to abdomen ( right) SQ without difficulty.     Patient tolerated the injection well.    Patient has been scheduled for the next injection which will be due in approximately 1 months    [name]  is administered to abdomen ( left) SQ without difficulty.     Patient tolerated the injection well.    Patient has been scheduled for the next injection which will be due in approximately 1 months  Patient has a follow-up appointment with provider on December 03, 2017      Encounter Diagnoses   Name Primary?   ??? Prostate cancer metastatic to multiple sites [facility]) Yes   ??? Bone metastases ([place])        Orders Placed This Encounter   ??? THER/PROPH/DIAG INJECTION, SUBCUT/IM   ??? PROSTATE SPECIFIC ANTIGEN, TOTAL (PSA)   ??? TESTOSTERONE   ??? COMPREHENSIVE METABOLIC PANEL   ??? CHEMOTHER HORMON ANTINEOPL SUB-Q/IM   ??? PR DEGARELIX INJECTION     Order Specific Question:   Charge Quantity?     Answer:   26     Order Specific Question:   Dose     Answer:   80 mg     Order Specific Question:   Site     Answer:   RIGHT LOWER QUAD ABDOMEN     Order Specific Question:   Expiration Date     Answer:   10/22/2019     Order Specific Question:   Lot#     Answer:   [id number]      Order Specific Question:   Manufacturer     Answer:   Arlana Lindau     Order Specific Question:   Perfomed by/Witnessed by:     Answer:   fh     Order Specific Question:   NDC#     Answer:   [address]-2 [[id number]]   ??? PR DENOSUMAB INJECTION     Order Specific Question:   Charge Quantity?     Answer:   120     Order Specific Question:   Dose     Answer:   120 mg     Order Specific Question:   Site     Answer:   LEFT LOWER QUAD ABDOMEN     Order Specific Question:   Expiration Date     Answer:   09/22/2019     Order Specific Question:   Lot#     Answer:   [id number]     Order Specific Question:   Manufacturer     Answer:   [employer]  Order Specific Question:   Perfomed by/Witnessed by:     Answer:   fh     Order Specific Question:   NDC#     Answer:   62694-854-62 [[id number]]   ??? COLLECTION VENOUS BLOOD,VENIPUNCTURE   ??? degarelix (FIRMAGON) 80 mg solr injection     Sig: 80 mg by SubCUTAneous route once for 1 dose.     Dispense:  1 Each     Refill:  0   ??? denosumab (XGEVA) soln injection     Sig: 1.7 mL by SubCUTAneous route once for 1 dose.     Dispense:  1.7 mL     Refill:  0       Patient did not supply own medication.      Axel Filler       Chart reviewed and agree.  Herbie [place] Given MD

## 2017-10-22 LAB — METABOLIC PANEL, COMPREHENSIVE
A-G Ratio: 1.5 (ref 1.2–2.2)
ALT (SGPT): 25 IU/L (ref 0–44)
AST (SGOT): 18 IU/L (ref 0–40)
Albumin: 3.7 g/dL (ref 3.5–4.8)
Alk. phosphatase: 94 IU/L (ref 39–117)
BUN/Creatinine ratio: 20 (ref 10–24)
BUN: 16 mg/dL (ref 8–27)
Bilirubin, total: 0.4 mg/dL (ref 0.0–1.2)
CO2: 23 mmol/L (ref 20–29)
Calcium: 10 mg/dL (ref 8.6–10.2)
Chloride: 99 mmol/L (ref 96–106)
Creatinine: 0.8 mg/dL (ref 0.76–1.27)
GFR est AA: 99 mL/min/{1.73_m2} (ref >59)
GFR est non-AA: 86 mL/min/{1.73_m2} (ref >59)
GLOBULIN, TOTAL: 2.5 g/dL (ref 1.5–4.5)
Glucose: 436 mg/dL — ABNORMAL HIGH (ref 65–99)
Potassium: 5 mmol/L (ref 3.5–5.2)
Protein, total: 6.2 g/dL (ref 6.0–8.5)
Sodium: 136 mmol/L (ref 134–144)

## 2017-10-22 LAB — TESTOSTERONE: Testosterone: <3 ng/dL — ABNORMAL LOW (ref 348–1197)

## 2017-10-22 LAB — PROSTATE SPECIFIC ANTIGEN, TOTAL (PSA): Prostate Specific Ag: 12.45 ng/mL — ABNORMAL HIGH (ref 0.00–4.00)

## 2017-10-30 NOTE — Telephone Encounter (Signed)
LVM for pt to call back to go over monthly questionnaire before picking up medication.

## 2017-10-30 NOTE — Telephone Encounter (Signed)
Monthly Questionnaire for [facility] [employer]  Are you taking your medication, Zytiga as prescribed?  yes     If on Zytiga, when was the last CMP done? 10/21/17    If on Zytiga, are you taking your prednisone as prescribed? yes    Have you experienced any new or worsening side effects from this medication in the last 30 days?  no     Have you experienced any new or worsening pain in the last 30 days?  no   If so, where?      Pain severity:       Have you had any recent cardiac issues in the last 30 days? (arrythmias, heart attack, chest pain, cardiac catheterizations, cardiac surgeries, cardiac related hospitalizations) no   Do you have a cardiologist? yes    If so, name of Cardiologist Dr. Jerline Pain @  [facility], [facility]     Have you experienced any changes in your ability to perform your usual daily activities in the last 30 days?  no             Have you experienced any significant changes in your weight or appetite in the last 30 days?  no    Have you experienced any significant changes in your ability to sleep in the last 30 days?  no    Have you experienced any increase in fatigue over the last 30 days?  no    Patient's next appointment is scheduled for 12/03/17.    Patient has agreed to pick-up their medication on 10/30/17 and I have confirmed he has enough to last until then.

## 2017-11-18 ENCOUNTER — Encounter: Primary: Family Medicine

## 2017-11-19 ENCOUNTER — Encounter: Primary: Family Medicine

## 2017-11-21 ENCOUNTER — Institutional Professional Consult (permissible substitution): Admit: 2017-11-21 | Discharge: 2017-11-21 | Primary: Family Medicine

## 2017-11-21 DIAGNOSIS — C61 Malignant neoplasm of prostate: Secondary | ICD-10-CM

## 2017-11-21 MED ORDER — DEGARELIX 80 MG SUB-Q SOLN
80 mg | Freq: Once | SUBCUTANEOUS | 0 refills | Status: AC
Start: 2017-11-21 — End: 2017-11-21

## 2017-11-21 MED ORDER — DENOSUMAB 120 MG/1.7 ML (70 MG/ML) SUB-Q
120 mg/1.7 mL (70 mg/mL) | Freq: Once | SUBCUTANEOUS | 0 refills | Status: AC
Start: 2017-11-21 — End: 2017-11-21

## 2017-11-21 NOTE — Progress Notes (Signed)
John Higgins is a 77 y.o. male who is here today per the order of Dr. Given  to receive Degarelix and [name]  and to have labs drawn.   Patient's identity has been verified.   Dr. Norberto Sorenson  was available in the clinic as incident to provider.     Patient has not had any dental procedures since his last injection.  Patient denies any upcoming dental procedures.  Patient  denies jaw pain.  Patient wears DENTURES.    Patient has been taking supplements of at least 500 mg of calcium and 400 international units of Vitamin D daily.  This is not his first ADT injection.  This is his first DENOSUMAB injection.  He has had the necessary labs drawn.  PSA, TST and CMP obtained via venipuncture without any difficulty.  Patient will be notified with lab results.     Degarelix  is administered to abdomen ( left) SQ without difficulty.     Patient tolerated the injection well.    Patient has been scheduled for the next injection which will be due in approximately 1 months    [name]  is administered to abdomen ( right) SQ without difficulty.     Patient tolerated the injection well.    Patient has been scheduled for the next injection which will be due in approximately 1 months  Patient has a follow-up appointment with provider on December 03, 2017      Encounter Diagnoses   Name Primary?   ??? Prostate cancer metastatic to multiple sites [facility]) Yes   ??? Bone metastases ([place])        Orders Placed This Encounter   ??? THER/PROPH/DIAG INJECTION, SUBCUT/IM   ??? PROSTATE SPECIFIC ANTIGEN, TOTAL (PSA)   ??? TESTOSTERONE   ??? COMPREHENSIVE METABOLIC PANEL   ??? CHEMOTHER HORMON ANTINEOPL SUB-Q/IM   ??? PR DEGARELIX INJECTION     Order Specific Question:   Charge Quantity?     Answer:   87     Order Specific Question:   Dose     Answer:   37 MG     Order Specific Question:   Site     Answer:   LEFT LOWER QUAD ABDOMEN     Order Specific Question:   Expiration Date     Answer:   11/22/2019     Order Specific Question:   Lot#     Answer:   [id number]      Order Specific Question:   Manufacturer     Answer:   Arlana Lindau     Order Specific Question:   Perfomed by/Witnessed by:     Answer:   Nenahnezad     Order Specific Question:   NDC#     Answer:   [id number] [[id number]]   ??? PR DENOSUMAB INJECTION     Order Specific Question:   Charge Quantity?     Answer:   120     Order Specific Question:   Dose     Answer:   120 mg     Order Specific Question:   Site     Answer:   RIGHT LOWER QUAD ABDOMEN     Order Specific Question:   Expiration Date     Answer:   09/22/2019     Order Specific Question:   Lot#     Answer:   [id number]     Order Specific Question:   Manufacturer     Answer:   [employer]     Order  Specific Question:   Perfomed by/Witnessed by:     Answer:   fh     Order Specific Question:   NDC#     Answer:   [id number] [[id number]]   ??? COLLECTION VENOUS BLOOD,VENIPUNCTURE   ??? degarelix (FIRMAGON) 80 mg solr injection     Sig: 80 mg by SubCUTAneous route once for 1 dose.     Dispense:  1 Each     Refill:  0   ??? denosumab (XGEVA) soln injection     Sig: 1.7 mL by SubCUTAneous route once for 1 dose.     Dispense:  1.7 mL     Refill:  0       Patient did not supply own medication.      Axel Filler   Reviewed by Drusilla Kanner, MD

## 2017-11-22 LAB — METABOLIC PANEL, COMPREHENSIVE
A-G Ratio: 1.4 (ref 1.2–2.2)
ALT (SGPT): 23 IU/L (ref 0–44)
AST (SGOT): 19 IU/L (ref 0–40)
Albumin: 3.7 g/dL (ref 3.5–4.8)
Alk. phosphatase: 90 IU/L (ref 39–117)
BUN/Creatinine ratio: 24 (ref 10–24)
BUN: 24 mg/dL (ref 8–27)
Bilirubin, total: 0.3 mg/dL (ref 0.0–1.2)
CO2: 18 mmol/L — ABNORMAL LOW (ref 20–29)
Calcium: 9.8 mg/dL (ref 8.6–10.2)
Chloride: 99 mmol/L (ref 96–106)
Creatinine: 0.99 mg/dL (ref 0.76–1.27)
GFR est AA: 84 mL/min/{1.73_m2} (ref >59)
GFR est non-AA: 73 mL/min/{1.73_m2} (ref >59)
GLOBULIN, TOTAL: 2.7 g/dL (ref 1.5–4.5)
Glucose: 312 mg/dL — ABNORMAL HIGH (ref 65–99)
Potassium: 5.4 mmol/L — ABNORMAL HIGH (ref 3.5–5.2)
Protein, total: 6.4 g/dL (ref 6.0–8.5)
Sodium: 139 mmol/L (ref 134–144)

## 2017-11-24 LAB — PROSTATE SPECIFIC ANTIGEN, TOTAL (PSA): Prostate Specific Ag: 10.92 ng/mL — ABNORMAL HIGH (ref 0.00–4.00)

## 2017-11-24 LAB — TESTOSTERONE: Testosterone: <3 ng/dL — ABNORMAL LOW (ref 348–1197)

## 2017-11-27 NOTE — Progress Notes (Signed)
Patient was notified that [facility] unblinded the treatment arms of the study.  Patient was informed that he was assigned to the Placebo arm of the study.    Briscoe Burns, MA, CRC  Research Coordinator    FOR FULL DOCUMENTATION OF THIS CLINICAL TRIAL VISIT, PLEASE SEE THE SOURCE DOCUMENT, AS THIS IS THE FIRST PLACE VISIT INFORMATION FROM CLINICAL TRIAL VISITS IS RECORDED.

## 2017-12-03 ENCOUNTER — Ambulatory Visit: Admit: 2017-12-03 | Discharge: 2017-12-03 | Attending: Urology | Primary: Family Medicine

## 2017-12-03 DIAGNOSIS — C61 Malignant neoplasm of prostate: Secondary | ICD-10-CM

## 2017-12-03 LAB — AMB POC URINALYSIS DIP STICK AUTO W/O MICRO
Bilirubin (UA POC): NEGATIVE
Blood (UA POC): NEGATIVE
Ketones (UA POC): NEGATIVE
Leukocyte esterase (UA POC): NEGATIVE
Nitrites (UA POC): NEGATIVE
Protein (UA POC): NEGATIVE
Specific gravity (UA POC): 1.015 (ref 1.001–1.035)
Urobilinogen (UA POC): 0.2 (ref 0.2–1)
pH (UA POC): 7 (ref 4.6–8.0)

## 2017-12-03 NOTE — Progress Notes (Signed)
John Higgins  1941-05-29  Impression:  Encounter Diagnoses     ICD-10-CM ICD-9-CM   1. Prostate cancer metastatic to multiple sites ([place]) C61 185     199.0   2. 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:   -Bone scan and CT scan on 04/2016 showed extensive osseous metastatic disease   - Initiated on Degerelix 05/14/16   -Started [name] 06/11/16 (pt edentulous, so no dental clearance needed), most recent injection 05/28/17   -Enrolled in [facility] trial 06/21/16, d/c trial, when unblinded pt was on Placebo   -Bone scan and CT performed 12/12/16 - bone scan with improvement in bone mets   -Bone scan 03/11/17:  bone scan with stable number of bone mets, but increased activity in right superior pubic ramus, upper margin of the left SI joint, left intertrochanteric region, left scapula    - Bone scan 05/28/17 - There is interval development of a new metastatic focus within the right humeral head. There is also a new focus of increased  or tracer localization seen within the right hemisacrum near the SI joint suggesting additional metastatic disease. ??Since the previous study, there is increasing radiotracer localization seen within the upper thoracic spine, right acetabulum, along the left SI joint, left superior pubic ramus and intertrochanteric region of the proximal left femur suggesting progression of metastatic disease.    Thoracic involvement documented as 15 foci, previously 40 foci in May.     Current Status:  CRPC m1b              - Completed Provenge on 09/15/2017             -Currently on Zytiga and Prednisone initiated 07/2017             -CT scan 08/26/17 was negative for metastatic    -Most recent PSA of 10.02 ng/mL as of 07/16/2017, previously 6.23 ng/mL  drawn in 07/03/2017. PSA today pending   -CT A/P 09/26/2017 no evidence of metastatic disease.   -Bone scan 09/26/2017 revealed multiple new osseous metastases.   -Most recent PSA 10.92 ng/mL as of 11/21/17   Significant CAD history - CABG 2005, multiple coronary stents most recently 05/2014, managed by Dr Jerline Pain    Plan:  Reviewed most recent PSA- 10.92 ng/mL  Discussed chemo is not needed at this time  Recommend continuing current regimen  Continue Xgeva  Contine ADT, with Degarelix  Continue Zytiga and Prednisone  Continue Vit D and Ca supplements (Prosteon)  CMP monthly  Recommend heart healthy diet and regular exercise  FU in 3 months with SD PSA, PSA, T, Vit D and CMP      Chief Complaint   Patient presents with   ??? Prostate Cancer       HPI: John Higgins, 77 y.o. male who presents today in follow up for prostate cancer.    Pt initially underwent prostate biopsy for PSA of 43. Biopsy positive for gleason 10 disease.  Bone scan showed extensive osseous metastatic disease with innumerable lesions of the axial and proximal appendicular skeleton. Pt initiated on Degarelix on 05/14/16. Initiated on XGeva 06/11/16.  Tolerating well, no SEs.  CT A/P 09/26/2017 no evidence of metastatic disease. Bone scan 09/26/2017 revealed multiple new osseous metastases.  Previously on [facility]ES clinical trial, when study was unblinded pt was on Placebo. He was d/c mid study.  Pt was initiated on Zytiga and Prednisone in 07/2017. Tolerating well.  No SEs.    Notes previous lower back has improved since last visit. No focal bone pain.  Continues on Vit D and Ca supplements.    No voiding complaints. Denies hematuria or dysuria.    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  Past Medical History:   Diagnosis Date   ??? CAD (coronary artery disease) 2005, 2015    CABG 2005, multiple stents, most recently in 05/2014   ??? Diabetes ([place])    ??? DM (diabetes mellitus screen)    ??? GERD (gastroesophageal reflux disease)    ??? HTN (hypertension)    ??? Liver disorder    ??? Prostate cancer ([place])     cT2bN0M1b Prostate Cancer w/ bone mets   ??? Thyroid disease           Past Surgical History:   Procedure Laterality Date   ??? CARDIAC SURG PROCEDURE UNLIST       cardiac bypass 2005 stent placement   ??? HX CATARACT REMOVAL Left 08/16/2015   ??? HX CHOLECYSTECTOMY  1974   ??? 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 HX SPLENECTOMY           Allergies   Allergen Reactions   ??? Naproxen Hives         Current Outpatient Medications:   ???  isosorbide mononitrate ER (IMDUR) 30 mg tablet, , Disp: , Rfl:   ???  atorvastatin (LIPITOR) 40 mg tablet, , Disp: , Rfl:   ???  abiraterone (ZYTIGA) 250 mg tab, Take four tablets by mouth daily on an empty stomach.  Take one hour prior to food or two hours after food. Tablets should be swallowed whole with water.  Do not crush or chew tablets.  Indications: metastatic castration-resistant prostate cancer, Disp: 120 Tab, Rfl: 11  ???  predniSONE (DELTASONE) 5 mg tablet, Take 1 Tab by mouth two (2) times a day., Disp: 60 Tab, Rfl: 11  ???  metoprolol succinate (TOPROL-XL) 50 mg XL tablet, daily., Disp: , Rfl:   ???  nitroglycerin (NITROSTAT) 0.4 mg SL tablet, , Disp: , Rfl:   ???  isosorbide dinitrate (ISORDIL) 30 mg tablet, Take 30 mg by mouth four (4) times daily., Disp: , Rfl:   ???  gabapentin (NEURONTIN) 300 mg capsule, two (2) times a day., Disp: , Rfl:   ???  glimepiride (AMARYL) 1 mg tablet, every evening., Disp: , Rfl:   ???  tamsulosin (FLOMAX) 0.4 mg capsule, every evening., Disp: , Rfl:   ???  clopidogrel (PLAVIX) 75 mg tablet, daily., Disp: , Rfl:   ???  METFORMIN HCL (METFORMIN PO), Take  by mouth., Disp: , Rfl:   ???  LEVOTHYROXINE SODIUM (LEVOTHYROXINE PO), Take  by mouth., Disp: , Rfl:   ???  aspirin 81 mg tablet, Take 81 mg by mouth., Disp: , Rfl:     History reviewed. No pertinent family history.  Social History     Socioeconomic History   ??? Marital status: DIVORCED     Spouse name: Not on file   ??? Number of children: Not on file   ??? Years of education: Not on file   ??? Highest education level: Not on file   Tobacco Use   ??? Smoking status: Former Smoker    ??? Smokeless tobacco: Never Used   Substance and Sexual Activity   ???  Alcohol use: No   ??? Drug use: No     Any elements of the PMH, FH, SHx, ROS, or preliminary elements of the HPI that were entered by a medical assistant have been reviewed in full.    Review of Systems  Constitutional: Fever: No  Skin: Rash: No  HEENT: Hearing difficulty: No  Eyes: Blurred vision: No  Cardiovascular: Chest pain: No  Respiratory: Shortness of breath: No  Gastrointestinal: Nausea/vomiting: No  Musculoskeletal: Back pain: No  Neurological: Weakness: No  Psychological: Memory loss: No  Comments/additional findings:       PE:  Visit Vitals  BP 116/74   Ht 5' 9"  (1.753 m)   Wt 173 lb (78.5 kg)   BMI 25.55 kg/m??     Constitutional: WDWN, Pleasant and appropriate affect, No acute distress.    GU: no CVA tenderness   CV: no peripheral edema  Resp: normal respiratory effort, lungs CTAB  ABD: non-distended  Musc:normal gait and station  Neuro: CN II-XII grossly intact.   Psych: normal affect, well groomed, appropriate answers, A&Ox3      Lab Results   Component Value Date/Time    Sodium 139 11/21/2017 03:44 PM    Potassium 5.4 (H) 11/21/2017 03:44 PM    Chloride 99 11/21/2017 03:44 PM    CO2 18 (L) 11/21/2017 03:44 PM    Anion gap 7 09/18/2017 03:02 AM    Glucose 312 (H) 11/21/2017 03:44 PM    BUN 24 11/21/2017 03:44 PM    Creatinine 0.99 11/21/2017 03:44 PM    BUN/Creatinine ratio 24 11/21/2017 03:44 PM    GFR est AA 84 11/21/2017 03:44 PM    GFR est non-AA 73 11/21/2017 03:44 PM    Calcium 9.8 11/21/2017 03:44 PM    Bilirubin, total 0.3 11/21/2017 03:44 PM    AST (SGOT) 19 11/21/2017 03:44 PM    Alk. phosphatase 90 11/21/2017 03:44 PM    Protein, total 6.4 11/21/2017 03:44 PM    Albumin 3.7 11/21/2017 03:44 PM    A-G Ratio 1.4 11/21/2017 03:44 PM    ALT (SGPT) 23 11/21/2017 03:44 PM     Bone Scan 08/26/2017:  BONE LESIONS: Numerous osseous metastatic foci are again noted. ??There is  a focus of increased radiotracer localization seen within the proximal shaft of the humerus suggesting metastatic disease, new since the previous study. ??A focus of increased radiotracer localization seen within the L1 vertebral body is also new. ??A subtle focus of increased radiotracer localization seen within the intertrochanteric region of the proximal right femur is new since the previous study. ??There is interval increase in radiotracer localization seen within multiple foci previously noted including the left intertrochanteric region, right acetabulum, left superior pubic ramus, several bilateral rib lesions, several lesions within the thoracic spine, pelvic and SI joint regions bilaterally, right humeral head, left clavicle and xyphoid process suggesting progression of osseous metastatic disease. ??A small focus of increased radiotracer localization is seen along a right anterior mid to lower rib. ??This is  nonspecific although commonly reflects post-traumatic phenomenon.     ???? ?? ?? ?? ?? ?? ??SKULL: There are approximately zero metastatic foci, as described above.    ???? ?? ?? ?? ?? ?? ??THORAX: There are approximately 15 metastatic foci, as described above.     ???? ?? ?? ?? ?? ?? ??SPINE: There are approximately 41 metastatic foci, as described above.    ???? ?? ?? ?? ?? ?? ??PELVIS: There are approximately 13 metastatic foci, as described  above.    ???? ?? ?? ?? ?? ?? ??EXTREMITIES: There are approximately 13 metastatic foci, as described above.    SOFT TISSUE CONTOURS: ??Normal.     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     OTHER FINDINGS: Radiotracer localization suggesting degenerative changes again noted within the shoulders, knees and feet/ankles bilaterally.     CT A/P WO Cont 08/26/2017:  Kidneys/ureters:  There is an approximate 4.4 cm exophytic simple cyst projecting posteriorly from the mid left kidney.  There is a 1.6 cm cyst on the anteromedial lower left kidney.   There is a 2.8 cm simple cyst projecting laterally from the lower right kidney.    Bowel:  Small sliding hiatal hernia.  No bowel obstruction or inflammation is seen. The appendix is seen in the central abdomen mildly extending to the left side and appears normal.    Aorta/vasculature:  The aorta shows atherosclerotic calcification without significant aneurysm.    Pelvis:  Within normal limits.     CT A/P WO cont 04/19/2016;  Impression:   1. Findings concerning for new osseous metastasis.  Bone scan recommended for further assessment.  2.  Interval postsurgical changes of the distal pancreas and spleen.  3.  No obvious metastasis to the liver, though evaluation is limited on a noncontrast study.  No significant abdominal or pelvic adenopathy.  4.  Small fat containing a focal hernia and patulous left inguinal ring.  5.  Enlarged prostate.    -Bone Scan 05/01/2016  Impression:  Extensive osseous metastatic disease with innumerable lesions of the axial and proximal appendicular skeleton.    Prostate Biopsy 05/14/16:  SURGICAL PATHOLOGY REPORT   DRE: Prostate 40 grams and nodule at the left apex   Last PSA Result: Rising ~ 43.03 on 04/15/2016     ??   DIAGNOSIS:   A. Right Apex Needle biopsy ??   ?? ?? ?? ?? ?? ?? 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 ?? ?? ?? ?? ?? ??(1 of 1 core(s) positive). Grade Group 5. Tumor involves one end.   B. Right Mid Needle biopsy ??   ?? ?? ?? ?? ?? ?? ??ADENOCARCINOMA, GLEASON SCORE 5 + 5 = 10 involving 40 % of the specimen (1 of 1 core(s) positive).   ?? ?? ?? ?? ?? ?? ??Grade Group 5. Tumor involves one end.   C. Right Base Needle biopsy ??   ?? ?? ?? ?? ?? ?? ??ADENOCARCINOMA, GLEASON SCORE 5 + 4 = 9 involving 3 % of the specimen (1 of 1 core(s) positive).   ?? ?? ?? ?? ?? ?? ??Grade Group 5. Ends not involved by the tumor.   D. Left Apex Needle biopsy ??   ?? ?? ?????? ?? ?? ??Benign prostatic tissue.   E. Left Mid Needle biopsy ??    ?? ?? ?? ?? ?? ?? ??Benign prostatic tissue.   F. Left Base Needle biopsy ??   ?? ?? ?? ?? ?? ?? ??Benign prostatic tissue.         Herbie Van Dyne Jakia Kennebrew MD   Urologic Oncologist  [facility] and Hermelinda Medicus MD Professorship  Professor of Urology  [facility]  Office [phone] Ext [phone]        IE:PPIR, Earlie Server, MD      Medical documentation provided with the assistance of Georges Mouse, medical scribe to Herbie [place]  W Layonna Dobie, MD on 12/03/2017.

## 2017-12-03 NOTE — Telephone Encounter (Signed)
Monthly Questionnaire for [facility] [employer]  Are you taking your medication, Zytiga as prescribed?  yes- 4 pill/day     If on Zytiga, when was the last CMP done? 11/21/17    If on Zytiga, are you taking your prednisone as prescribed? yes    Have you experienced any new or worsening side effects from this medication in the last 30 days?  no     Have you experienced any new or worsening pain in the last 30 days?  no   If so, where?      Pain severity:       Have you had any recent cardiac issues in the last 30 days? (arrythmias, heart attack, chest pain, cardiac catheterizations, cardiac surgeries, cardiac related hospitalizations) no   Do you have a cardiologist? no    If so, name of Cardiologist      Have you experienced any changes in your ability to perform your usual daily activities in the last 30 days?  no             Have you experienced any significant changes in your weight or appetite in the last 30 days?  no    Have you experienced any significant changes in your ability to sleep in the last 30 days?  no    Have you experienced any increase in fatigue over the last 30 days?  no    Patient's next appointment is scheduled for 12/03/17.    Patient has agreed to pick-up their medication on 12/03/17 and I have confirmed he has enough to last until then.

## 2017-12-19 ENCOUNTER — Institutional Professional Consult (permissible substitution): Admit: 2017-12-19 | Discharge: 2017-12-19 | Primary: Family Medicine

## 2017-12-19 DIAGNOSIS — C61 Malignant neoplasm of prostate: Secondary | ICD-10-CM

## 2017-12-19 MED ORDER — DENOSUMAB 120 MG/1.7 ML (70 MG/ML) SUB-Q
120 mg/1.7 mL (70 mg/mL) | Freq: Once | SUBCUTANEOUS | 0 refills | Status: AC
Start: 2017-12-19 — End: 2017-12-19

## 2017-12-19 MED ORDER — DEGARELIX 80 MG SUB-Q SOLN
80 mg | Freq: Once | SUBCUTANEOUS | 0 refills | Status: AC
Start: 2017-12-19 — End: 2017-12-19

## 2017-12-19 NOTE — Progress Notes (Signed)
John Higgins is a 77 y.o. male who is here today per the order of Dr. Given  to receive Deg 32 and to have labs drawn.   Patient's identity has been verified.   Dr. Quentin Ore was available in the clinic as incident to provider.     Patient has full dentures.  Patient denies any upcoming dental procedures.  Patient  denies jaw pain.  Dental Clearance letter on file is up to date.   Date of last Dental Clearance: Pt has full dentures    Patient has been taking supplements of at least 500 mg of calcium and 400 international units of Vitamin D daily.  This is not his first ADT injection.  This is not his first DENOSUMAB injection.  He has had the necessary labs drawn.      Deg 80 is administered to abdomen ( right) SQ without difficulty.     Patient tolerated the injection well.    Patient has been scheduled for the next injection which will be due in approximately 1 months    [name] is administered to abdomen ( left) SQ without difficulty.     Patient tolerated the injection well.    Patient has been scheduled for the next injection which will be due in approximately 1 months  Patient has a follow-up appointment with provider on January 16, 2018      Encounter Diagnoses   Name Primary?   ??? Malignant neoplasm of prostate ([place]) Yes   ??? 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 THER/PROPH/DIAG INJECTION, SUBCUT/IM   ??? CHEMOTHER HORMON ANTINEOPL SUB-Q/IM   ??? PR DEGARELIX INJECTION     Order Specific Question:   Charge Quantity?     Answer:   73     Order Specific Question:   Dose     Answer:   80mg      Order Specific Question:   Site     Answer:   RIGHT LOWER QUAD ABDOMEN     Order Specific Question:   Expiration Date     Answer:   12/04/2017     Order Specific Question:   Lot#     Answer:   [id number]     Order Specific Question:   Manufacturer     Answer:   Arlana Lindau     Order Specific Question:   Perfomed by/Witnessed by:     Answer:   cp      Order Specific Question:   NDC#     Answer:   [address]-8 [[id number]]   ??? PR DENOSUMAB INJECTION     Order Specific Question:   Charge Quantity?     Answer:   120     Order Specific Question:   Dose     Answer:   120mg      Order Specific Question:   Site     Answer:   LEFT LOWER QUAD ABDOMEN     Order Specific Question:   Expiration Date     Answer:   10/19/2019     Order Specific Question:   Lot#     Answer:   [id number]     Order Specific Question:   Manufacturer     Answer:   [employer]     Order Specific Question:   Perfomed by/Witnessed by:     Answer:   cp     Order Specific Question:   NDC#  Answer:   88416-606-30 [[id number]]   ??? denosumab (XGEVA) soln injection     Sig: 1.7 mL by SubCUTAneous route once for 1 dose.     Dispense:  1.7 mL     Refill:  0   ??? degarelix (FIRMAGON) 80 mg solr injection     Sig: 80 mg by SubCUTAneous route once for 1 dose.     Dispense:  1 Each     Refill:  0   ??? denosumab (XGEVA) soln injection     Sig: 1.7 mL by SubCUTAneous route once for 1 dose.     Dispense:  1.7 mL     Refill:  0       Patient did not supply own medication.      Celeste Pezzano     Reviewed by Young Berry, MD on 12/19/2017 and agree with the above.

## 2017-12-30 NOTE — Telephone Encounter (Signed)
Pt c/o left shoulder pain, pain is off and on and increases when he pushed on shoulder. Pt asked for nurse to be informed. I am routing this to [place].   Monthly Questionnaire for [facility]  Are you taking your medication, Zytiga as prescribed?  yes -all 4 pill per day     If on Zytiga, when was the last CMP done? 11/21/17    If on Zytiga, are you taking your prednisone as prescribed? yes    Have you experienced any new or worsening side effects from this medication in the last 30 days?  Yes  Have you experienced any new or worsening pain in the last 30 days?  yes   If so, where?  Left Shoulder     Pain severity:  5 (pain is off and on, pt took Tramadol for pain)     Have you had any recent cardiac issues in the last 30 days? (arrythmias, heart attack, chest pain, cardiac catheterizations, cardiac surgeries, cardiac related hospitalizations) no   Do you have a cardiologist? yes    If so, name of Cardiologist  Dr. Myles Lipps [facility], [place]      Have you experienced any changes in your ability to perform your usual daily activities in the last 30 days?  no             Have you experienced any significant changes in your weight or appetite in the last 30 days?  no    Have you experienced any significant changes in your ability to sleep in the last 30 days?  no    Have you experienced any increase in fatigue over the last 30 days?  no    Patient's next appointment is scheduled for 02/04/18    Patient has agreed to pick-up their medication on 12/31/17 and I have confirmed he has enough to last until then.

## 2018-01-02 ENCOUNTER — Ambulatory Visit: Admit: 2018-01-02 | Discharge: 2018-01-02 | Attending: Urology | Primary: Family Medicine

## 2018-01-02 DIAGNOSIS — C61 Malignant neoplasm of prostate: Secondary | ICD-10-CM

## 2018-01-02 LAB — AMB POC URINALYSIS DIP STICK AUTO W/O MICRO
Blood (UA POC): NEGATIVE
Leukocyte esterase (UA POC): NEGATIVE
Nitrites (UA POC): NEGATIVE
Specific gravity (UA POC): 1.025 (ref 1.001–1.035)
Urobilinogen (UA POC): 0.2 (ref 0.2–1)
pH (UA POC): 5.5 (ref 4.6–8.0)

## 2018-01-02 NOTE — Progress Notes (Signed)
John Higgins  Sep 21, 1941  Impression:  Encounter Diagnoses     ICD-10-CM ICD-9-CM   1. Prostate cancer ([place]) C61 185   2. 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:   -Bone scan and CT scan on 04/2016 showed extensive osseous metastatic disease   - Initiated on Degerelix 05/14/16   -Started [name] 06/11/16 (pt edentulous, so no dental clearance needed), most recent injection 05/28/17   -Enrolled in ARCHES trial 06/21/16, d/c trial, when unblinded pt was on Placebo   -Bone scan and CT performed 12/12/16 - bone scan with improvement in bone mets   -Bone scan 03/11/17:  bone scan with stable number of bone mets, but increased activity in right superior pubic ramus, upper margin of the left SI joint, left intertrochanteric region, left scapula    - Bone scan 05/28/17 - There is interval development of a new metastatic focus within the right humeral head. There is also a new focus of increased  or tracer localization seen within the right hemisacrum near the SI joint suggesting additional metastatic disease. ??Since the previous study, there is increasing radiotracer localization seen within the upper thoracic spine, right acetabulum, along the left SI joint, left superior pubic ramus and intertrochanteric region of the proximal left femur suggesting progression of metastatic disease.    Thoracic involvement documented as 15 foci, previously 40 foci in May.     Current Status:  CRPC m1b              - Completed Provenge on 09/15/2017             -Currently on Zytiga and Prednisone initiated 07/2017             -CT scan 08/26/17 was negative for metastatic    -Most recent PSA of 10.02 ng/mL as of 07/16/2017, previously 6.23 ng/mL  drawn in 07/03/2017. PSA today pending   -CT A/P 09/26/2017 no evidence of metastatic disease.   -Bone scan 09/26/2017 revealed multiple new osseous metastases.   -Most recent PSA 19 on 12/24/17   Significant CAD history - CABG 2005, multiple coronary stents most recently 05/2014, managed by Dr Jerline Pain    Plan:  Reviewed most recent PSA- 19  Repeat PSA per pt request  Discussed possible need for chemo if he is failing Zytiga.  Will be in touch with Dr Sheppard Coil to discuss  Recommend continuing current regimen for now  Continue Xgeva. He will need to stop Xgeva for 2 months if he is planning to have dental work done  [employer] ADT, with Degarelix  Continue Zytiga and Prednisone  Continue Vit D and Ca supplements (Prosteon)  CMP monthly  Follow up as scheduled in April        Chief Complaint   Patient presents with   ??? Prostate Cancer     Pt wants to discuss elevated PSA from Dr Sheppard Coil 19.       HPI: John Higgins, 77 y.o. male who presents today in follow up for prostate cancer.    Pt initially underwent prostate biopsy for PSA of 43. Biopsy positive for gleason 10 disease.  Bone scan showed extensive osseous metastatic disease with innumerable lesions of the axial and proximal appendicular skeleton. Pt initiated on Degarelix on 05/14/16. Initiated on XGeva 06/11/16.  Tolerating well, no SEs.  CT A/P 09/26/2017 no evidence of metastatic disease. Bone scan 09/26/2017 revealed multiple new osseous metastases.  Previously on ARCHES  clinical trial, when study was unblinded pt was on Placebo. He was d/c mid study.  Pt was initiated on Zytiga and Prednisone in 07/2017. Most recent PSA 19 drawn at [facility] on 12/24/17.      Notes previous lower back has improved since last visit. No focal bone pain.  Continues on Vit D and Ca supplements.    No voiding complaints. Denies hematuria or dysuria.  He is having new shoulder pains bilaterally, worse when supine.    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  Past Medical History:   Diagnosis Date   ??? CAD (coronary artery disease) 2005, 2015    CABG 2005, multiple stents, most recently in 05/2014    ??? Diabetes ([place])    ??? DM (diabetes mellitus screen)    ??? GERD (gastroesophageal reflux disease)    ??? HTN (hypertension)    ??? Liver disorder    ??? Prostate cancer ([place])     cT2bN0M1b Prostate Cancer w/ bone mets   ??? Thyroid disease          Past Surgical History:   Procedure Laterality Date   ??? CARDIAC SURG PROCEDURE UNLIST       cardiac bypass 2005 stent placement   ??? HX CATARACT REMOVAL Left 08/16/2015   ??? HX CHOLECYSTECTOMY  1974   ??? 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 HX SPLENECTOMY           Allergies   Allergen Reactions   ??? Naproxen Hives         Current Outpatient Medications:   ???  atorvastatin (LIPITOR) 40 mg tablet, , Disp: , Rfl:   ???  abiraterone (ZYTIGA) 250 mg tab, Take four tablets by mouth daily on an empty stomach.  Take one hour prior to food or two hours after food. Tablets should be swallowed whole with water.  Do not crush or chew tablets.  Indications: metastatic castration-resistant prostate cancer, Disp: 120 Tab, Rfl: 11  ???  predniSONE (DELTASONE) 5 mg tablet, Take 1 Tab by mouth two (2) times a day., Disp: 60 Tab, Rfl: 11  ???  metoprolol succinate (TOPROL-XL) 50 mg XL tablet, daily., Disp: , Rfl:   ???  nitroglycerin (NITROSTAT) 0.4 mg SL tablet, , Disp: , Rfl:   ???  isosorbide dinitrate (ISORDIL) 30 mg tablet, Take 30 mg by mouth four (4) times daily., Disp: , Rfl:   ???  gabapentin (NEURONTIN) 300 mg capsule, two (2) times a day., Disp: , Rfl:   ???  glimepiride (AMARYL) 1 mg tablet, every evening., Disp: , Rfl:   ???  tamsulosin (FLOMAX) 0.4 mg capsule, every evening., Disp: , Rfl:   ???  clopidogrel (PLAVIX) 75 mg tablet, daily., Disp: , Rfl:   ???  METFORMIN HCL (METFORMIN PO), Take  by mouth., Disp: , Rfl:   ???  LEVOTHYROXINE SODIUM (LEVOTHYROXINE PO), Take  by mouth., Disp: , Rfl:   ???  aspirin 81 mg tablet, Take 81 mg by mouth., Disp: , Rfl:   ???  isosorbide mononitrate ER (IMDUR) 30 mg tablet, , Disp: , Rfl:     History reviewed. No pertinent family history.  Social History      Socioeconomic History   ??? Marital status: DIVORCED     Spouse name: Not on file   ??? Number of children: Not on file   ???  Years of education: Not on file   ??? Highest education level: Not on file   Tobacco Use   ??? Smoking status: Former Smoker   ??? Smokeless tobacco: Never Used   Substance and Sexual Activity   ??? Alcohol use: No   ??? Drug use: No     Any elements of the PMH, FH, SHx, ROS, or preliminary elements of the HPI that were entered by a medical assistant have been reviewed in full.    Review of Systems  Constitutional: Fever: No  Skin: Rash: No  HEENT: Hearing difficulty: No  Eyes: Blurred vision: No  Cardiovascular: Chest pain: No  Respiratory: Shortness of breath: No  Gastrointestinal: Nausea/vomiting: No  Musculoskeletal: Back pain: No  Neurological: Weakness: No  Psychological: Memory loss: No  Comments/additional findings:       PE:  Visit Vitals  BP 120/64   Ht 5' 9"  (1.753 m)   Wt 169 lb (76.7 kg)   BMI 24.96 kg/m??     Constitutional: WDWN, Pleasant and appropriate affect, No acute distress.    GU: no CVA tenderness   CV: no peripheral edema  Resp: normal respiratory effort  ABD: non-distended  Musc:normal gait and station  Neuro: CN II-XII grossly intact.   Psych: normal affect, well groomed, appropriate answers, A&Ox3      Lab Results   Component Value Date/Time    Sodium 139 11/21/2017 03:44 PM    Potassium 5.4 (H) 11/21/2017 03:44 PM    Chloride 99 11/21/2017 03:44 PM    CO2 18 (L) 11/21/2017 03:44 PM    Anion gap 7 09/18/2017 03:02 AM    Glucose 312 (H) 11/21/2017 03:44 PM    BUN 24 11/21/2017 03:44 PM    Creatinine 0.99 11/21/2017 03:44 PM    BUN/Creatinine ratio 24 11/21/2017 03:44 PM    GFR est AA 84 11/21/2017 03:44 PM    GFR est non-AA 73 11/21/2017 03:44 PM    Calcium 9.8 11/21/2017 03:44 PM    Bilirubin, total 0.3 11/21/2017 03:44 PM    AST (SGOT) 19 11/21/2017 03:44 PM    Alk. phosphatase 90 11/21/2017 03:44 PM    Protein, total 6.4 11/21/2017 03:44 PM    Albumin 3.7 11/21/2017 03:44 PM     A-G Ratio 1.4 11/21/2017 03:44 PM    ALT (SGPT) 23 11/21/2017 03:44 PM     Bone Scan 08/26/2017:  BONE LESIONS: Numerous osseous metastatic foci are again noted. ??There is a focus of increased radiotracer localization seen within the proximal shaft of the humerus suggesting metastatic disease, new since the previous study. ??A focus of increased radiotracer localization seen within the L1 vertebral body is also new. ??A subtle focus of increased radiotracer localization seen within the intertrochanteric region of the proximal right femur is new since the previous study. ??There is interval increase in radiotracer localization seen within multiple foci previously noted including the left intertrochanteric region, right acetabulum, left superior pubic ramus, several bilateral rib lesions, several lesions within the thoracic spine, pelvic and SI joint regions bilaterally, right humeral head, left clavicle and xyphoid process suggesting progression of osseous metastatic disease. ??A small focus of increased radiotracer localization is seen along a right anterior mid to lower rib. ??This is  nonspecific although commonly reflects post-traumatic phenomenon.     ???? ?? ?? ?? ?? ?? ??SKULL: There are approximately zero metastatic foci, as described above.    ???? ?? ?? ?? ?? ?? ??THORAX: There are approximately 15 metastatic foci, as  described above.     ???? ?? ?? ?? ?? ?? ??SPINE: There are approximately 41 metastatic foci, as described above.    ???? ?? ?? ?? ?? ?? ??PELVIS: There are approximately 13 metastatic foci, as described above.    ???? ?? ?? ?? ?? ?? ??EXTREMITIES: There are approximately 13 metastatic foci, as described above.    SOFT TISSUE CONTOURS: ??Normal.     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     OTHER FINDINGS: Radiotracer localization suggesting degenerative changes again noted within the shoulders, knees and feet/ankles bilaterally.     CT A/P WO Cont 08/26/2017:  Kidneys/ureters:   There is an approximate 4.4 cm exophytic simple cyst projecting posteriorly from the mid left kidney.  There is a 1.6 cm cyst on the anteromedial lower left kidney.  There is a 2.8 cm simple cyst projecting laterally from the lower right kidney.    Bowel:  Small sliding hiatal hernia.  No bowel obstruction or inflammation is seen. The appendix is seen in the central abdomen mildly extending to the left side and appears normal.    Aorta/vasculature:  The aorta shows atherosclerotic calcification without significant aneurysm.    Pelvis:  Within normal limits.     CT A/P WO cont 04/19/2016;  Impression:   1. Findings concerning for new osseous metastasis.  Bone scan recommended for further assessment.  2.  Interval postsurgical changes of the distal pancreas and spleen.  3.  No obvious metastasis to the liver, though evaluation is limited on a noncontrast study.  No significant abdominal or pelvic adenopathy.  4.  Small fat containing a focal hernia and patulous left inguinal ring.  5.  Enlarged prostate.    -Bone Scan 05/01/2016  Impression:  Extensive osseous metastatic disease with innumerable lesions of the axial and proximal appendicular skeleton.    Prostate Biopsy 05/14/16:  SURGICAL PATHOLOGY REPORT   DRE: Prostate 40 grams and nodule at the left apex   Last PSA Result: Rising ~ 43.03 on 04/15/2016     ??   DIAGNOSIS:   A. Right Apex Needle biopsy ??   ?? ?? ?? ?? ?? ?? 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 ?? ?? ?? ?? ?? ??(1 of 1 core(s) positive). Grade Group 5. Tumor involves one end.   B. Right Mid Needle biopsy ??   ?? ?? ?? ?? ?? ?? ??ADENOCARCINOMA, GLEASON SCORE 5 + 5 = 10 involving 40 % of the specimen (1 of 1 core(s) positive).   ?? ?? ?? ?? ?? ?? ??Grade Group 5. Tumor involves one end.   C. Right Base Needle biopsy ??   ?? ?? ?? ?? ?? ?? ??ADENOCARCINOMA, GLEASON SCORE 5 + 4 = 9 involving 3 % of the specimen (1 of 1 core(s) positive).    ?? ?? ?? ?? ?? ?? ??Grade Group 5. Ends not involved by the tumor.   D. Left Apex Needle biopsy ??   ?? ?? ?????? ?? ?? ??Benign prostatic tissue.   E. Left Mid Needle biopsy ??   ?? ?? ?? ?? ?? ?? ??Benign prostatic tissue.   F. Left Base Needle biopsy ??   ?? ?? ?? ?? ?? ?? ??Benign prostatic tissue.         Okey Regal Myka Lukins, MD          ZO:[id number], Earlie Server, MD

## 2018-01-05 LAB — PROSTATE SPECIFIC ANTIGEN, TOTAL (PSA): Prostate Specific Ag: 21.69 ng/mL — ABNORMAL HIGH (ref 0.00–4.00)

## 2018-01-05 NOTE — Telephone Encounter (Signed)
Pt has called inquiring in regards to his PSA results. Advised Dr Given has not reviewed them as of yet will forward to him. Once done the nurse will contact him.   Pt can be reached at [phone] cell or home [phone].

## 2018-01-05 NOTE — Telephone Encounter (Addendum)
Contacted John Higgins, following up on his shoulder pain wanted to see how he was feeling. Mentioned at his 01/02/18 visit and Xtandi refill phone call 12/30/17.      John Higgins called back, shoulder pain has gone away. Advised if it was to start backup again need to discuss with his pcp, Dr Manuella Ghazi.    Inquired about his PSA advised that Given has not reviewed it at this time would like results. Once reviewed we will have contact him.

## 2018-01-06 NOTE — Telephone Encounter (Signed)
Pt informed of rise in PSA.  Discussed options of proceeding with :  Chemotherapy  Switching to Xtandi  [facility] clinical trial    Not a candidate for Xofigo as on [place]    He has some interest in trial so will see if he qualifies.    Research nurse notified

## 2018-01-15 ENCOUNTER — Ambulatory Visit: Admit: 2018-01-15 | Discharge: 2018-01-15 | Primary: Family Medicine

## 2018-01-15 DIAGNOSIS — C61 Malignant neoplasm of prostate: Secondary | ICD-10-CM

## 2018-01-15 NOTE — Progress Notes (Signed)
The informed consent (V 2 DATED 05/08/17) for a clinical trial for subjects with [facility] was presented to the subject. (The subject had previously been mailed a copy of the ICF to review on 04 January 2018. The informed consent was reviewed in detail with the subject in the examination room. The subject expressed the following questions:     None    After all of the subject's questions were answered the subject expressed the desire to participate in the clinical trial. The subject signed and dated the informed consent with all other parties.  A copy of the signed and dated ICF was given to the subject for their home files.      Screening procedures were initiated after the informed consent was signed and dated by all parties.  Labs were drawn and processed per protocol.    Briscoe Burns, MA, CRC  Research Coordinator    FOR FULL DOCUMENTATION OF THIS CLINICAL TRIAL VISIT, PLEASE SEE THE SOURCE DOCUMENT, AS THIS IS THE FIRST PLACE VISIT INFORMATION FROM CLINICAL TRIAL VISITS IS RECORDED.

## 2018-01-16 ENCOUNTER — Institutional Professional Consult (permissible substitution): Admit: 2018-01-16 | Discharge: 2018-01-16 | Primary: Family Medicine

## 2018-01-16 DIAGNOSIS — C61 Malignant neoplasm of prostate: Secondary | ICD-10-CM

## 2018-01-16 MED ORDER — DEGARELIX 80 MG SUB-Q SOLN
80 mg | Freq: Once | SUBCUTANEOUS | 0 refills | Status: AC
Start: 2018-01-16 — End: 2018-01-16

## 2018-01-16 MED ORDER — DENOSUMAB 120 MG/1.7 ML (70 MG/ML) SUB-Q
120 mg/1.7 mL (70 mg/mL) | Freq: Once | SUBCUTANEOUS | 0 refills | Status: AC
Start: 2018-01-16 — End: 2018-01-16

## 2018-01-16 NOTE — Progress Notes (Signed)
John Higgins is a 77 y.o. male who is here today per the order of Dr. Given  to receive degrarelix and [name]  and to have labs drawn.   Patient's identity has been verified.   Dr. Claiborne Billings  was available in the clinic as incident to provider.     Patient has not  had any dental procedures since his last injection.  Patient denies any upcoming dental procedures.  Patient  denies jaw pain.  Patient wears dentures.     Patient has been taking supplements of at least 500 mg of calcium and 400 international units of Vitamin D daily.  This is not his first ADT injection.  This is not his first DENOSUMAB injection.  He has had the necessary labs drawn.  CMP obtained via venipuncture without any difficulty.  Patient will be notified with lab results.     degarelix  is administered to abdomen ( left) SQ without difficulty.     Patient tolerated the injection well.    Patient has been scheduled for the next injection which will be due in approximately 1 months    [name]  is administered to abdomen ( right) SQ without difficulty.     Patient tolerated the injection well.    Patient has been scheduled for the next injection which will be due in approximately 1 months  Patient has a follow-up appointment with provider on February 04, 2018      Encounter Diagnoses   Name Primary?   ??? Malignant neoplasm of prostate ([place]) Yes   ??? Bone metastases ([place])        Orders Placed This Encounter   ??? THER/PROPH/DIAG INJECTION, SUBCUT/IM   ??? COMPREHENSIVE METABOLIC PANEL   ??? CHEMOTHER HORMON ANTINEOPL SUB-Q/IM   ??? PR DEGARELIX INJECTION     Order Specific Question:   Charge Quantity?     Answer:   33     Order Specific Question:   Dose     Answer:   80 mg     Order Specific Question:   Site     Answer:   LEFT LOWER QUAD ABDOMEN     Order Specific Question:   Expiration Date     Answer:   01/17/2020     Order Specific Question:   Lot#     Answer:   [id number]     Order Specific Question:   Manufacturer     Answer:   ferring      Order Specific Question:   Perfomed by/Witnessed by:     Answer:   fh     Order Specific Question:   NDC#     Answer:   [id number] [[id number]]   ??? PR DENOSUMAB INJECTION     Order Specific Question:   Charge Quantity?     Answer:   120   ??? COLLECTION VENOUS BLOOD,VENIPUNCTURE   ??? degarelix (FIRMAGON) 80 mg solr injection     Sig: 80 mg by SubCUTAneous route once for 1 dose.     Dispense:  1 Each     Refill:  0   ??? denosumab (XGEVA) soln injection     Sig: 1.7 mL by SubCUTAneous route once for 1 dose.     Dispense:  1.7 mL     Refill:  0       Patient did not supply own medication.      Axel Filler     Reviewed by Quinn Axe, MD

## 2018-01-17 LAB — METABOLIC PANEL, COMPREHENSIVE
A-G Ratio: 1.2 (ref 1.2–2.2)
ALT (SGPT): 26 IU/L (ref 0–44)
AST (SGOT): 19 IU/L (ref 0–40)
Albumin: 3.6 g/dL (ref 3.5–4.8)
Alk. phosphatase: 110 IU/L (ref 39–117)
BUN/Creatinine ratio: 15 (ref 10–24)
BUN: 15 mg/dL (ref 8–27)
Bilirubin, total: 0.4 mg/dL (ref 0.0–1.2)
CO2: 23 mmol/L (ref 20–29)
Calcium: 10.2 mg/dL (ref 8.6–10.2)
Chloride: 100 mmol/L (ref 96–106)
Creatinine: 0.99 mg/dL (ref 0.76–1.27)
GFR est AA: 84 mL/min/{1.73_m2} (ref >59)
GFR est non-AA: 73 mL/min/{1.73_m2} (ref >59)
GLOBULIN, TOTAL: 2.9 g/dL (ref 1.5–4.5)
Glucose: 256 mg/dL — ABNORMAL HIGH (ref 65–99)
Potassium: 4.8 mmol/L (ref 3.5–5.2)
Protein, total: 6.5 g/dL (ref 6.0–8.5)
Sodium: 139 mmol/L (ref 134–144)

## 2018-01-27 MED ORDER — ABIRATERONE 250 MG TABLET
250 mg | ORAL_TABLET | ORAL | 3 refills | Status: AC
Start: 2018-01-27 — End: ?

## 2018-01-27 NOTE — Telephone Encounter (Signed)
Due to pts high co-pay , Dr. Given has agreed to switch pt to generic Zytiga (Abiraterone).Lorne Skeens

## 2018-01-27 NOTE — Telephone Encounter (Signed)
Pt has medication ready for pick up his co-pay is $124.52, His patient assistance has run out, I will discuss further with pt when he returns my phone call. Lorne Skeens

## 2018-01-29 NOTE — Telephone Encounter (Signed)
Pt c/o severe leg pain on both legs, its often and most of the time unbearable, Please advise.       Monthly Questionnaire for [facility]  Are you taking your medication, Abiraterone  as prescribed?  yes     If on Zytiga, when was the last CMP done?  01/16/18   If on Zytiga, are you taking your prednisone as prescribed? yes    Have you experienced any new or worsening side effects from this medication in the last 30 days?  no     Have you experienced any new or worsening pain in the last 30 days?  no   If so, where?      Pain severity:       Have you had any recent cardiac issues in the last 30 days? (arrythmias, heart attack, chest pain, cardiac catheterizations, cardiac surgeries, cardiac related hospitalizations) no   Do you have a cardiologist? yes    If so, name of Cardiologist Dr. Jerline Pain  In [place] @ [facility]    Have you experienced any changes in your ability to perform your usual daily activities in the last 30 days?  no             Have you experienced any significant changes in your weight or appetite in the last 30 days?  no    Have you experienced any significant changes in your ability to sleep in the last 30 days?  no    Have you experienced any increase in fatigue over the last 30 days?  no    Patient's next appointment is scheduled for 02/04/18    Patient has agreed to pick-up their medication on 01/29/18 and I have confirmed he has enough to last until then.

## 2018-02-04 ENCOUNTER — Ambulatory Visit: Admit: 2018-02-04 | Discharge: 2018-02-04 | Attending: Urology | Primary: Family Medicine

## 2018-02-04 DIAGNOSIS — C61 Malignant neoplasm of prostate: Secondary | ICD-10-CM

## 2018-02-04 LAB — PROSTATE SPECIFIC ANTIGEN, TOTAL (PSA): Prostate Specific Ag: 20.76 ng/mL — ABNORMAL HIGH (ref 0.00–4.00)

## 2018-02-04 LAB — AMB POC URINALYSIS DIP STICK AUTO W/O MICRO
Bilirubin (UA POC): NEGATIVE
Blood (UA POC): NEGATIVE
Glucose (UA POC): NEGATIVE
Ketones (UA POC): NEGATIVE
Leukocyte esterase (UA POC): NEGATIVE
Nitrites (UA POC): NEGATIVE
Protein (UA POC): NEGATIVE
Specific gravity (UA POC): 1.015 (ref 1.001–1.035)
Urobilinogen (UA POC): 1 (ref 0.2–1)
pH (UA POC): 7 (ref 4.6–8.0)

## 2018-02-04 LAB — TESTOSTERONE: Testosterone: <3 ng/dL — ABNORMAL LOW (ref 348–1197)

## 2018-02-04 NOTE — Progress Notes (Signed)
John Higgins presents today for lab draw per Dr. Bufford Buttner order.     PSA, TST, CMP, Vitamin D and CBC obtained via venipuncture without any difficulty.    Patient will be notified with lab results.     Orders Placed This Encounter   ??? PROSTATE SPECIFIC ANTIGEN, TOTAL (PSA)   ??? TESTOSTERONE   ??? VITAMIN D, 25 HYDROXY   ??? COMPREHENSIVE METABOLIC PANEL   ??? CBC W/O DIFF   ??? COLLECTION VENOUS [place]

## 2018-02-04 NOTE — Progress Notes (Signed)
John Higgins  05/17/1941  Impression:  Encounter Diagnoses     ICD-10-CM ICD-9-CM   1. Prostate cancer ([facility]) C61 185   2. Vitamin D deficiency E55.9 268.9   3. Prostate cancer metastatic to multiple sites ([facility]) C61 185     199.0   4. 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:   -Bone scan and CT scan on 04/2016 showed extensive osseous metastatic disease   - Initiated on Degerelix 05/14/16   -Started [name] 06/11/16 (pt edentulous, so no dental clearance needed), most recent injection 05/28/17   -Enrolled in ARCHES trial 06/21/16, d/c trial, when unblinded pt was on Placebo   -Bone scan and CT performed 12/12/16 - bone scan with improvement in bone mets   -Bone scan 03/11/17:  bone scan with stable number of bone mets, but increased activity in right superior pubic ramus, upper margin of the left SI joint, left intertrochanteric region, left scapula    - Bone scan 05/28/17 - There is interval development of a new metastatic focus within the right humeral head. There is also a new focus of increased  or tracer localization seen within the right hemisacrum near the SI joint suggesting additional metastatic disease. ??Since the previous study, there is increasing radiotracer localization seen within the upper thoracic spine, right acetabulum, along the left SI joint, left superior pubic ramus and intertrochanteric region of the proximal left femur suggesting progression of metastatic disease.    Thoracic involvement documented as 15 foci, previously 40 foci in May.     Current Status:  CRPC m1b              - Completed Provenge on 09/15/2017             -Currently on Zytiga and Prednisone initiated 07/2017             -CT scan 08/26/17 was negative for metastatic    -Most recent PSA of 10.02 ng/mL as of 07/16/2017, previously 6.23 ng/mL  drawn in 07/03/2017. PSA today pending   -CT A/P 09/26/2017 no evidence of metastatic disease.    -Bone scan 09/26/2017 revealed multiple new osseous metastases.   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 Candidate for Triton trial    Significant CAD history - CABG 2005, multiple coronary stents most recently 05/2014, managed by Dr Jerline Pain    Plan:  Reviewed most recent PSA- 20.76 ng/mL, stable  Recommend continuing current regimen for now  Continue Xgeva.  Discussed Xofigo. Reviewed risks and benefits. Will need to hold   Pt is a candidate for TRITON study, will have Adrienne follow up  Contine ADT, with Degarelix  Continue Zytiga and Prednisone  Continue Vit D and Ca supplements (Prosteon)  CMP monthly  Advised to consider genetic testing for his children  If pt desires to enroll in TRITON clinical trial, will FU per study protocol        Chief Complaint   Patient presents with   ??? Prostate Cancer       HPI: John Higgins, 77 y.o. male who presents today in follow up for prostate cancer.    Pt initially underwent prostate biopsy for PSA of 43. Biopsy positive for gleason 10 disease.  Bone scan showed extensive osseous metastatic disease with innumerable lesions of the axial and proximal appendicular skeleton.  Pt initiated on Degarelix on 05/14/16. Initiated on XGeva 06/11/16.  Tolerating well, no SEs.  CT A/P 09/26/2017 no evidence of metastatic disease. Bone scan 09/26/2017 revealed multiple new osseous metastases.  Previously on ARCHES clinical trial, when study was unblinded pt was on Placebo. He was d/c mid study.  Pt was initiated on Zytiga and Prednisone in 07/2017. No SEs. Notes glucose levels have been elevated since starting prednisone  Most recent PSA 20.76 ng/mL as of 02/04/2018.    Patient reports significant yet occasional lower back pain on the bilaterally. Notes pain is worsened with prolonged sitting. He notes a history of back pain, current pain feels similar to previous pain. Takes Tramadol for pain with benefit. Good appetite and stable weight.      Voiding well. Denies hematuria, dysuria, incontinence, and incomplete bladder emptying. Asymptomatic for infection.    Continues on Vit D and Ca supplements.    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  Past Medical History:   Diagnosis Date   ??? CAD (coronary artery disease) 2005, 2015    CABG 2005, multiple stents, most recently in 05/2014   ??? Diabetes ([facility])    ??? DM (diabetes mellitus screen)    ??? GERD (gastroesophageal reflux disease)    ??? HTN (hypertension)    ??? Liver disorder    ??? Prostate cancer ([facility])     cT2bN0M1b Prostate Cancer w/ bone mets   ??? Thyroid disease          Past Surgical History:   Procedure Laterality Date   ??? CARDIAC SURG PROCEDURE UNLIST       cardiac bypass 2005 stent placement   ??? HX CATARACT REMOVAL Left 08/16/2015   ??? HX CHOLECYSTECTOMY  1974   ??? 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 HX SPLENECTOMY           Allergies   Allergen Reactions   ??? Naproxen Hives         Current Outpatient Medications:   ???  insulin glargine,hum.rec.anlog (TOUJEO SOLOSTAR U-300 INSULIN SC), by SubCUTAneous route., Disp: , Rfl:   ???  abiraterone 250 mg tab, Take four tablets by mouth daily on an empty stomach.????Take one hour prior to food or two hours after food. Tablets should be swallowed whole with water.????, Disp: 120 Tab, Rfl: 3  ???  atorvastatin (LIPITOR) 40 mg tablet, , Disp: , Rfl:   ???  predniSONE (DELTASONE) 5 mg tablet, Take 1 Tab by mouth two (2) times a day., Disp: 60 Tab, Rfl: 11  ???  metoprolol succinate (TOPROL-XL) 50 mg XL tablet, daily., Disp: , Rfl:   ???  nitroglycerin (NITROSTAT) 0.4 mg SL tablet, , Disp: , Rfl:   ???  isosorbide dinitrate (ISORDIL) 30 mg tablet, Take 30 mg by mouth four (4) times daily., Disp: , Rfl:   ???  gabapentin (NEURONTIN) 300 mg capsule, two (2) times a day., Disp: , Rfl:   ???  glimepiride (AMARYL) 1 mg tablet, every evening., Disp: , Rfl:    ???  tamsulosin (FLOMAX) 0.4 mg capsule, every evening., Disp: , Rfl:   ???  clopidogrel (PLAVIX) 75 mg tablet, daily., Disp: , Rfl:   ???  METFORMIN HCL (METFORMIN PO), Take  by mouth., Disp: , Rfl:   ???  LEVOTHYROXINE SODIUM (LEVOTHYROXINE PO), Take  by mouth., Disp: , Rfl:   ???  aspirin 81 mg tablet, Take 81 mg by mouth., Disp: , Rfl:   ???  isosorbide mononitrate ER (IMDUR) 30 mg tablet, , Disp: , Rfl:     History reviewed. No pertinent family history.  Social History     Socioeconomic History   ??? Marital status: DIVORCED     Spouse name: Not on file   ??? Number of children: Not on file   ??? Years of education: Not on file   ??? Highest education level: Not on file   Tobacco Use   ??? Smoking status: Former Smoker   ??? Smokeless tobacco: Never Used   Substance and Sexual Activity   ??? Alcohol use: No   ??? Drug use: No     Any elements of the PMH, FH, SHx, ROS, or preliminary elements of the HPI that were entered by a medical assistant have been reviewed in full.    Review of Systems  Constitutional: Fever: No  Skin: Rash: No  HEENT: Hearing difficulty: Yes  Eyes: Blurred vision: No  Cardiovascular: Chest pain: No  Respiratory: Shortness of breath: No  Gastrointestinal: Nausea/vomiting: No  Musculoskeletal: Back pain: No  Neurological: Weakness: Yes  Psychological: Memory loss: Yes  Comments/additional findings:       PE:  Visit Vitals  BP 128/70   Ht 5' 9"  (1.753 m)   Wt 172 lb (78 kg)   BMI 25.40 kg/m??     Constitutional: WDWN, Pleasant and appropriate affect, No acute distress.    GU: no CVA tenderness   CV: no peripheral edema  Resp: normal respiratory effort  ABD: non-distended  Musc:normal gait and station  Neuro: CN II-XII grossly intact.   Psych: normal affect, well groomed, appropriate answers, A&Ox3      Lab Results   Component Value Date/Time    Sodium 140 02/04/2018 02:28 PM    Potassium 4.6 02/04/2018 02:28 PM    Chloride 101 02/04/2018 02:28 PM    CO2 22 02/04/2018 02:28 PM    Anion gap 7 09/18/2017 03:02 AM     Glucose 203 (H) 02/04/2018 02:28 PM    BUN 13 02/04/2018 02:28 PM    Creatinine 0.90 02/04/2018 02:28 PM    BUN/Creatinine ratio 14 02/04/2018 02:28 PM    GFR est AA 94 02/04/2018 02:28 PM    GFR est non-AA 82 02/04/2018 02:28 PM    Calcium 9.6 02/04/2018 02:28 PM    Bilirubin, total 0.4 02/04/2018 02:28 PM    AST (SGOT) 20 02/04/2018 02:28 PM    Alk. phosphatase 118 (H) 02/04/2018 02:28 PM    Protein, total 6.4 02/04/2018 02:28 PM    Albumin 3.6 02/04/2018 02:28 PM    A-G Ratio 1.3 02/04/2018 02:28 PM    ALT (SGPT) 32 02/04/2018 02:28 PM     Bone Scan 08/26/2017:  BONE LESIONS: Numerous osseous metastatic foci are again noted. ??There is a focus of increased radiotracer localization seen within the proximal shaft of the humerus suggesting metastatic disease, new since the previous study. ??A focus of increased radiotracer localization seen within the L1 vertebral body is also new. ??A subtle focus of increased radiotracer localization seen within the intertrochanteric region of the proximal right femur is new since the previous study. ??There is interval increase in radiotracer localization seen within multiple foci previously noted including the left intertrochanteric region, right acetabulum, left superior pubic ramus, several bilateral rib lesions, several lesions within the thoracic spine, pelvic and SI joint regions bilaterally, right humeral head, left clavicle  and xyphoid process suggesting progression of osseous metastatic disease. ??A small focus of increased radiotracer localization is seen along a right anterior mid to lower rib. ??This is  nonspecific although commonly reflects post-traumatic phenomenon.     ???? ?? ?? ?? ?? ?? ??SKULL: There are approximately zero metastatic foci, as described above.    ???? ?? ?? ?? ?? ?? ??THORAX: There are approximately 15 metastatic foci, as described above.     ???? ?? ?? ?? ?? ?? ??SPINE: There are approximately 41 metastatic foci, as described above.     ???? ?? ?? ?? ?? ?? ??PELVIS: There are approximately 13 metastatic foci, as described above.    ???? ?? ?? ?? ?? ?? ??EXTREMITIES: There are approximately 13 metastatic foci, as described above.    SOFT TISSUE CONTOURS: ??Normal.     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     OTHER FINDINGS: Radiotracer localization suggesting degenerative changes again noted within the shoulders, knees and feet/ankles bilaterally.     CT A/P WO Cont 08/26/2017:  Kidneys/ureters:  There is an approximate 4.4 cm exophytic simple cyst projecting posteriorly from the mid left kidney.  There is a 1.6 cm cyst on the anteromedial lower left kidney.  There is a 2.8 cm simple cyst projecting laterally from the lower right kidney.    Bowel:  Small sliding hiatal hernia.  No bowel obstruction or inflammation is seen. The appendix is seen in the central abdomen mildly extending to the left side and appears normal.    Aorta/vasculature:  The aorta shows atherosclerotic calcification without significant aneurysm.    Pelvis:  Within normal limits.     CT A/P WO cont 04/19/2016;  Impression:   1. Findings concerning for new osseous metastasis.  Bone scan recommended for further assessment.  2.  Interval postsurgical changes of the distal pancreas and spleen.  3.  No obvious metastasis to the liver, though evaluation is limited on a noncontrast study.  No significant abdominal or pelvic adenopathy.  4.  Small fat containing a focal hernia and patulous left inguinal ring.  5.  Enlarged prostate.    -Bone Scan 05/01/2016  Impression:  Extensive osseous metastatic disease with innumerable lesions of the axial and proximal appendicular skeleton.    Prostate Biopsy 05/14/16:  SURGICAL PATHOLOGY REPORT   DRE: Prostate 40 grams and nodule at the left apex   Last PSA Result: Rising ~ 43.03 on 04/15/2016     ??   DIAGNOSIS:   A. Right Apex Needle biopsy ??   ?? ?? ?? ?? ?? ?? ??ADENOCARCINOMA with neuroendocrine differentiation, GLEASON  SCORE 5 + 4 = 9 involving 60 % of the specimen   ?? ?? ?? ?? ?? ?? ??(1 of 1 core(s) positive). Grade Group 5. Tumor involves one end.   B. Right Mid Needle biopsy ??   ?? ?? ?? ?? ?? ?? ??ADENOCARCINOMA, GLEASON SCORE 5 + 5 = 10 involving 40 % of the specimen (1 of 1 core(s) positive).   ?? ?? ?? ?? ?? ?? ??Grade Group 5. Tumor involves one end.   C. Right Base Needle biopsy ??   ?? ?? ?? ?? ?? ?? ??ADENOCARCINOMA, GLEASON SCORE 5 + 4 = 9 involving 3 % of the specimen (1 of 1 core(s) positive).   ?? ?? ?? ?? ?? ?? ??Grade Group 5. Ends not involved by the tumor.   D. Left Apex Needle biopsy ??   ?? ?? ?????? ?? ?? ??  Benign prostatic tissue.   E. Left Mid Needle biopsy ??   ?? ?? ?? ?? ?? ?? ??Benign prostatic tissue.   F. Left Base Needle biopsy ??   ?? ?? ?? ?? ?? ?? ??Benign prostatic tissue.       Herbie McFarland Avleen Bordwell MD   Urologic Oncologist  [facility] MD Professorship  Professor of Urology  [facility] [phone]        ON:GEXB, Earlie Server, MD      Medical documentation provided with the assistance of Georges Mouse, medical scribe to Okey Regal Exie Chrismer, MD on 02/04/2018.

## 2018-02-05 ENCOUNTER — Encounter

## 2018-02-05 LAB — METABOLIC PANEL, COMPREHENSIVE
A-G Ratio: 1.3 (ref 1.2–2.2)
ALT (SGPT): 32 IU/L (ref 0–44)
AST (SGOT): 20 IU/L (ref 0–40)
Albumin: 3.6 g/dL (ref 3.5–4.8)
Alk. phosphatase: 118 IU/L — ABNORMAL HIGH (ref 39–117)
BUN/Creatinine ratio: 14 (ref 10–24)
BUN: 13 mg/dL (ref 8–27)
Bilirubin, total: 0.4 mg/dL (ref 0.0–1.2)
CO2: 22 mmol/L (ref 20–29)
Calcium: 9.6 mg/dL (ref 8.6–10.2)
Chloride: 101 mmol/L (ref 96–106)
Creatinine: 0.9 mg/dL (ref 0.76–1.27)
GFR est AA: 94 mL/min/{1.73_m2} (ref >59)
GFR est non-AA: 82 mL/min/{1.73_m2} (ref >59)
GLOBULIN, TOTAL: 2.8 g/dL (ref 1.5–4.5)
Glucose: 203 mg/dL — ABNORMAL HIGH (ref 65–99)
Potassium: 4.6 mmol/L (ref 3.5–5.2)
Protein, total: 6.4 g/dL (ref 6.0–8.5)
Sodium: 140 mmol/L (ref 134–144)

## 2018-02-05 LAB — CBC W/O DIFF
HCT: 37.9 % (ref 37.5–51.0)
HGB: 11.9 g/dL — ABNORMAL LOW (ref 13.0–17.7)
MCH: 27.9 pg (ref 26.6–33.0)
MCHC: 31.4 g/dL — ABNORMAL LOW (ref 31.5–35.7)
MCV: 89 fL (ref 79–97)
PLATELET: 503 10*3/uL — ABNORMAL HIGH (ref 150–379)
RBC: 4.27 x10E6/uL (ref 4.14–5.80)
RDW: 16.7 % — ABNORMAL HIGH (ref 12.3–15.4)
WBC: 15.4 10*3/uL — ABNORMAL HIGH (ref 3.4–10.8)

## 2018-02-05 NOTE — Progress Notes (Signed)
Patient is scheduled with MRI-CT [place] 02/13/2018 at 12:45pm. Patient is aware.

## 2018-02-06 LAB — VITAMIN D, 25 HYDROXY: VITAMIN D, 25-HYDROXY: >60 ng/ml (ref 30.0–100.0)

## 2018-02-13 ENCOUNTER — Encounter: Primary: Family Medicine

## 2018-02-13 ENCOUNTER — Ambulatory Visit: Admit: 2018-02-13 | Discharge: 2018-02-13 | Attending: Physician Assistant | Primary: Family Medicine

## 2018-02-13 ENCOUNTER — Encounter

## 2018-02-13 DIAGNOSIS — C61 Malignant neoplasm of prostate: Secondary | ICD-10-CM

## 2018-02-13 MED ORDER — DENOSUMAB 120 MG/1.7 ML (70 MG/ML) SUB-Q
120 mg/1.7 mL (70 mg/mL) | Freq: Once | SUBCUTANEOUS | 0 refills | Status: AC
Start: 2018-02-13 — End: 2018-02-13

## 2018-02-13 MED ORDER — DEGARELIX 80 MG SUB-Q SOLN
80 mg | Freq: Once | SUBCUTANEOUS | 0 refills | Status: AC
Start: 2018-02-13 — End: 2018-02-13

## 2018-02-13 MED ORDER — HYDROCODONE-ACETAMINOPHEN 5 MG-325 MG TAB
5-325 mg | ORAL_TABLET | Freq: Four times a day (QID) | ORAL | 0 refills | Status: AC | PRN
Start: 2018-02-13 — End: 2018-02-16

## 2018-02-13 NOTE — Progress Notes (Signed)
John Higgins  07-Dec-1940  Impression:  Encounter Diagnoses     ICD-10-CM ICD-9-CM   1. Prostate cancer metastatic to multiple sites [facility]) C61 185     199.0     cT2bN0M1b Prostate Cancer with Gleason 5+5 w/ bone mets:   -Bone scan and CT scan on 04/2016 showed extensive osseous metastatic disease   - Initiated on Degerelix 05/14/16, injection today   -Started XGeva 06/11/16 (pt edentulous, so no dental clearance needed), most recent injection 02/13/18   -Enrolled in ARCHES trial 06/21/16, d/c trial, when unblinded pt was on Placebo   -Bone scan and CT performed 12/12/16 - bone scan with improvement in bone mets   -Bone scan 03/11/17:  bone scan with stable number of bone mets, but increased activity in right superior pubic ramus, upper margin of the left SI joint, left intertrochanteric region, left scapula    - Bone scan 05/28/17 - There is interval development of a new metastatic focus within the right humeral head. There is also a new focus of increased  or tracer localization seen within the right hemisacrum near the SI joint suggesting additional metastatic disease. ??Since the previous study, there is increasing radiotracer localization seen within the upper thoracic spine, right acetabulum, along the left SI joint, left superior pubic ramus and intertrochanteric region of the proximal left femur suggesting progression of metastatic disease.    Thoracic involvement documented as 15 foci, previously 40 foci in May.     Current Status:  CRPC m1b              - Completed Provenge on 09/15/2017             -Currently on Zytiga and Prednisone initiated 07/2017             -CT scan 08/26/17 was negative for metastatic    -CT A/P 09/26/2017 no evidence of metastatic disease.   -Bone scan 09/26/2017 revealed multiple new osseous metastases.   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 Candidate for Triton trial    Significant CAD history - CABG 2005, multiple coronary stents most  recently 05/2014, managed by Dr Jerline Pain    Plan:  Pt enrolling in Triton trial  CT and bone scan today  Added chest xray for possible rib fractures from recent fall  Script for Norco 5/34m #20 for rib pain  Continue Xgeva, inj given today  Contine ADT, with Degarelix, inj given today  Continue Zytiga and Prednisone  Continue Vit D and Ca supplements (Prosteon)  CMP monthly  Advised to consider genetic testing for his children  Follow up per study protocol        Chief Complaint   Patient presents with   ??? Clinical Trials       HPI: John Higgins 77y.o. male who presents today for pursuant in Triton Trial.  He was previously on the [place].  Pt had recent fall on right side and he is concerned that he broke some ribs.    Pt initially underwent prostate biopsy for PSA of 43. Biopsy positive for gleason 10 disease.  Bone scan showed extensive osseous metastatic disease with innumerable lesions of the axial and proximal appendicular skeleton. Pt initiated on Degarelix on 05/14/16. Initiated on XGeva 06/11/16.  Tolerating well, no SEs.  CT A/P 09/26/2017 no evidence of metastatic disease. Bone scan 09/26/2017 revealed multiple new osseous  metastases.  Previously on ARCHES clinical trial, when study was unblinded pt was on Placebo. He was d/c mid study.  Pt was initiated on Zytiga and Prednisone in 07/2017. No SEs. Notes glucose levels have been elevated since starting prednisone  Most recent PSA 20.76 ng/mL as of 02/04/2018.  He is a candidate for Triton Trial    Voiding well. Denies hematuria, dysuria, incontinence, and incomplete bladder emptying. Asymptomatic for infection.    Continues on Vit D and Ca supplements.    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  Past Medical History:   Diagnosis Date   ??? CAD (coronary artery disease) 2005, 2015    CABG 2005, multiple stents, most recently in 05/2014   ??? Diabetes ([place])     ??? DM (diabetes mellitus screen)    ??? GERD (gastroesophageal reflux disease)    ??? HTN (hypertension)    ??? Liver disorder    ??? Prostate cancer ([place])     cT2bN0M1b Prostate Cancer w/ bone mets   ??? Thyroid disease          Past Surgical History:   Procedure Laterality Date   ??? CARDIAC SURG PROCEDURE UNLIST       cardiac bypass 2005 stent placement   ??? HX CATARACT REMOVAL Left 08/16/2015   ??? HX CHOLECYSTECTOMY  1974   ??? 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 HX SPLENECTOMY           Allergies   Allergen Reactions   ??? Naproxen Hives         Current Outpatient Medications:   ???  isosorbide mononitrate ER (IMDUR) 30 mg tablet, , Disp: , Rfl:   ???  insulin glargine,hum.rec.anlog (TOUJEO SOLOSTAR U-300 INSULIN SC), by SubCUTAneous route., Disp: , Rfl:   ???  abiraterone 250 mg tab, Take four tablets by mouth daily on an empty stomach.????Take one hour prior to food or two hours after food. Tablets should be swallowed whole with water.????, Disp: 120 Tab, Rfl: 3  ???  atorvastatin (LIPITOR) 40 mg tablet, , Disp: , Rfl:   ???  predniSONE (DELTASONE) 5 mg tablet, Take 1 Tab by mouth two (2) times a day., Disp: 60 Tab, Rfl: 11  ???  metoprolol succinate (TOPROL-XL) 50 mg XL tablet, daily., Disp: , Rfl:   ???  nitroglycerin (NITROSTAT) 0.4 mg SL tablet, , Disp: , Rfl:   ???  isosorbide dinitrate (ISORDIL) 30 mg tablet, Take 30 mg by mouth four (4) times daily., Disp: , Rfl:   ???  gabapentin (NEURONTIN) 300 mg capsule, two (2) times a day., Disp: , Rfl:   ???  glimepiride (AMARYL) 1 mg tablet, every evening., Disp: , Rfl:   ???  tamsulosin (FLOMAX) 0.4 mg capsule, every evening., Disp: , Rfl:   ???  clopidogrel (PLAVIX) 75 mg tablet, daily., Disp: , Rfl:   ???  METFORMIN HCL (METFORMIN PO), Take  by mouth., Disp: , Rfl:   ???  LEVOTHYROXINE SODIUM (LEVOTHYROXINE PO), Take  by mouth., Disp: , Rfl:   ???  aspirin 81 mg tablet, Take 81 mg by mouth., Disp: , Rfl:     History reviewed. No pertinent family history.  Social History      Socioeconomic History   ??? Marital status: DIVORCED     Spouse name: Not on file   ??? Number of  children: Not on file   ??? Years of education: Not on file   ??? Highest education level: Not on file   Tobacco Use   ??? Smoking status: Former Smoker   ??? Smokeless tobacco: Never Used   Substance and Sexual Activity   ??? Alcohol use: No   ??? Drug use: No     Any elements of the PMH, FH, SHx, ROS, or preliminary elements of the HPI that were entered by a medical assistant have been reviewed in full.    Review of Systems  Constitutional: Fever:   Skin: Rash:   HEENT: Hearing difficulty:   Eyes: Blurred vision:   Cardiovascular: Chest pain:   Respiratory: Shortness of breath:   Gastrointestinal: Nausea/vomiting:   Musculoskeletal: Back pain:   Neurological: Weakness:   Psychological: Memory loss:   Comments/additional findings:       PE:  There were no vitals taken for this visit.  Constitutional: WDWN, Pleasant and appropriate affect, No acute distress.    GU: no CVA tenderness   CV: no peripheral edema, RRR, no m,r,g. TTP right lateral thoracic ribs  Resp: normal respiratory effort, lungs CTAB  ABD: non-distended  Musc:normal gait and station  Neuro: CN II-XII grossly intact.   Psych: normal affect, well groomed, appropriate answers, A&Ox3      Lab Results   Component Value Date/Time    Sodium 140 02/04/2018 02:28 PM    Potassium 4.6 02/04/2018 02:28 PM    Chloride 101 02/04/2018 02:28 PM    CO2 22 02/04/2018 02:28 PM    Anion gap 7 09/18/2017 03:02 AM    Glucose 203 (H) 02/04/2018 02:28 PM    BUN 13 02/04/2018 02:28 PM    Creatinine 0.90 02/04/2018 02:28 PM    BUN/Creatinine ratio 14 02/04/2018 02:28 PM    GFR est AA 94 02/04/2018 02:28 PM    GFR est non-AA 82 02/04/2018 02:28 PM    Calcium 9.6 02/04/2018 02:28 PM    Bilirubin, total 0.4 02/04/2018 02:28 PM    AST (SGOT) 20 02/04/2018 02:28 PM    Alk. phosphatase 118 (H) 02/04/2018 02:28 PM    Protein, total 6.4 02/04/2018 02:28 PM    Albumin 3.6 02/04/2018 02:28 PM     A-G Ratio 1.3 02/04/2018 02:28 PM    ALT (SGPT) 32 02/04/2018 02:28 PM     Bone Scan 08/26/2017:  BONE LESIONS: Numerous osseous metastatic foci are again noted. ??There is a focus of increased radiotracer localization seen within the proximal shaft of the humerus suggesting metastatic disease, new since the previous study. ??A focus of increased radiotracer localization seen within the L1 vertebral body is also new. ??A subtle focus of increased radiotracer localization seen within the intertrochanteric region of the proximal right femur is new since the previous study. ??There is interval increase in radiotracer localization seen within multiple foci previously noted including the left intertrochanteric region, right acetabulum, left superior pubic ramus, several bilateral rib lesions, several lesions within the thoracic spine, pelvic and SI joint regions bilaterally, right humeral head, left clavicle and xyphoid process suggesting progression of osseous metastatic disease. ??A small focus of increased radiotracer localization is seen along a right anterior mid to lower rib. ??This is  nonspecific although commonly reflects post-traumatic phenomenon.     ???? ?? ?? ?? ?? ?? ??SKULL: There are approximately zero metastatic foci, as described above.    ???? ?? ?? ?? ?? ?? ??THORAX: There are approximately 15 metastatic foci, as described above.     ???? ?? ?? ?? ?? ?? ??  SPINE: There are approximately 41 metastatic foci, as described above.    ???? ?? ?? ?? ?? ?? ??PELVIS: There are approximately 13 metastatic foci, as described above.    ???? ?? ?? ?? ?? ?? ??EXTREMITIES: There are approximately 13 metastatic foci, as described above.    SOFT TISSUE CONTOURS: ??Normal.     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     OTHER FINDINGS: Radiotracer localization suggesting degenerative changes again noted within the shoulders, knees and feet/ankles bilaterally.     CT A/P WO Cont 08/26/2017:  Kidneys/ureters:   There is an approximate 4.4 cm exophytic simple cyst projecting posteriorly from the mid left kidney.  There is a 1.6 cm cyst on the anteromedial lower left kidney.  There is a 2.8 cm simple cyst projecting laterally from the lower right kidney.    Bowel:  Small sliding hiatal hernia.  No bowel obstruction or inflammation is seen. The appendix is seen in the central abdomen mildly extending to the left side and appears normal.    Aorta/vasculature:  The aorta shows atherosclerotic calcification without significant aneurysm.    Pelvis:  Within normal limits.     CT A/P WO cont 04/19/2016;  Impression:   1. Findings concerning for new osseous metastasis.  Bone scan recommended for further assessment.  2.  Interval postsurgical changes of the distal pancreas and spleen.  3.  No obvious metastasis to the liver, though evaluation is limited on a noncontrast study.  No significant abdominal or pelvic adenopathy.  4.  Small fat containing a focal hernia and patulous left inguinal ring.  5.  Enlarged prostate.    -Bone Scan 05/01/2016  Impression:  Extensive osseous metastatic disease with innumerable lesions of the axial and proximal appendicular skeleton.    Prostate Biopsy 05/14/16:  SURGICAL PATHOLOGY REPORT   DRE: Prostate 40 grams and nodule at the left apex   Last PSA Result: Rising ~ 43.03 on 04/15/2016     ??   DIAGNOSIS:   A. Right Apex Needle biopsy ??   ?? ?? ?? ?? ?? ?? 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 ?? ?? ?? ?? ?? ??(1 of 1 core(s) positive). Grade Group 5. Tumor involves one end.   B. Right Mid Needle biopsy ??   ?? ?? ?? ?? ?? ?? ??ADENOCARCINOMA, GLEASON SCORE 5 + 5 = 10 involving 40 % of the specimen (1 of 1 core(s) positive).   ?? ?? ?? ?? ?? ?? ??Grade Group 5. Tumor involves one end.   C. Right Base Needle biopsy ??   ?? ?? ?? ?? ?? ?? ??ADENOCARCINOMA, GLEASON SCORE 5 + 4 = 9 involving 3 % of the specimen (1 of 1 core(s) positive).    ?? ?? ?? ?? ?? ?? ??Grade Group 5. Ends not involved by the tumor.   D. Left Apex Needle biopsy ??   ?? ?? ?????? ?? ?? ??Benign prostatic tissue.   E. Left Mid Needle biopsy ??   ?? ?? ?? ?? ?? ?? ??Benign prostatic tissue.   F. Left Base Needle biopsy ??   ?? ?? ?? ?? ?? ?? ??Benign prostatic tissue.       Thresa Ross, PA        MW:[id number], Earlie Server, MD

## 2018-02-13 NOTE — Progress Notes (Signed)
SCREENING VISIT  Sponsor: [employer]  Protocol: [id number]  Subject: 027  Date of Visit: 13 February 2018      Informed Consent Process    Date Informed Consent Signed:  February 05, 2018    Date of ICF Version Signed:  08/14/17    Version/Amendment #: 6    The following were completed during the consenting process:     - Consent was signed prior to all the study related procedures.     - Patient had plenty of time to review the informed consent content.     - Patient's questions were answered.     - Patient received a copy of signed consent form.      Briscoe Burns, MA, [place]  Research Coordinator  -------------------------------------------------------------------------------------------  Subject came in for Screening Visit on 13 February 2018.    Inclusion/Exclusions were reviewed.  Subject continues to meet criteria.    Procedures    Vitals were performed. Blood pressure 100/62, pulse 96, temperature 98.3 ??F (36.8 ??C), resp. rate 12, height 5\' 9"  (1.753 m), weight 166 lb (75.3 kg)..      Labs were drawn without any difficulty and processed according to protocol.      CT and Bone Scans were performed on 02/13/18. Results were not available at time of visit.    ECG was done.    Physical Exam was done.    ECOG of 2.    Questionnaires were completed per protocol.    Subject declined optional consent for providing a tumor biopsy sample at the end of dosing.    Subject agreed to optional consent for sponsor to keep blood and tumor tissue for future research.    Patient is scheduled to return once all results have been received.    Briscoe Burns, MA, CRC  Research Coordinator    FOR FULL DOCUMENTATION OF THIS CLINICAL TRIAL VISIT, PLEASE SEE THE SOURCE DOCUMENT, AS THIS IS THE FIRST PLACE VISIT INFORMATION FROM CLINICAL TRIAL VISITS IS RECORDED.

## 2018-02-13 NOTE — Progress Notes (Signed)
John Higgins is a 77 y.o. male who is here today per the order of PA-Nikki Arvin Collard to receive Degarelix and [name] and to have labs drawn.   Patient's identity has been verified.   Dr. Claiborne Billings was available in the clinic as incident to provider.     Patient has not had any dental procedures since his last injection.  Patient denies any upcoming dental procedures.  Patient  denies jaw pain.  Pt has dentures.    Patient has been taking supplements of at least 500 mg of calcium and 400 international units of Vitamin D daily.  This is not his first ADT injection.  This is not his first DENOSUMAB injection.      Degarelix 80mg  is administered to abdomen ( right) SQ without difficulty.     Patient tolerated the injection well.    Patient has been scheduled for the next injection which will be due in approximately 1 months    [name] is administered to abdomen ( left) SQ without difficulty.     Patient tolerated the injection well.    Patient has been scheduled for the next injection which will be due in approximately 1 months        Encounter Diagnoses   Name Primary?   ??? Prostate cancer metastatic to multiple sites [facility]) Yes   ??? Rib pain on right side        Orders Placed This Encounter   ??? THER/PROPH/DIAG INJECTION, SUBCUT/IM   ??? PR DENOSUMAB INJECTION     Order Specific Question:   Charge Quantity?     Answer:   120     Order Specific Question:   Dose     Answer:   120 mg     Order Specific Question:   Site     Answer:   LEFT LOWER QUAD ABDOMEN     Order Specific Question:   Expiration Date     Answer:   06/03/2020     Order Specific Question:   Lot#     Answer:   [id number]     Order Specific Question:   Manufacturer     Answer:   [employer]     Order Specific Question:   Perfomed by/Witnessed by:     Answer:   APC     Order Specific Question:   NDC#     Answer:   13086-578-46 [[id number]]   ??? CHEMOTHER HORMON ANTINEOPL SUB-Q/IM   ??? PR DEGARELIX INJECTION     Order Specific Question:   Charge Quantity?     Answer:   53      Order Specific Question:   Dose     Answer:   80 mg     Order Specific Question:   Site     Answer:   RIGHT LOWER QUAD ABDOMEN     Order Specific Question:   Expiration Date     Answer:   03/03/2020     Order Specific Question:   Lot#     Answer:   [id number]     Order Specific Question:   Manufacturer     Answer:   Arlana Lindau     Order Specific Question:   Perfomed by/Witnessed by:     Answer:   APC     Order Specific Question:   NDC#     Answer:   [address]-6 [[id number]]   ??? HYDROcodone-acetaminophen (NORCO) 5-325 mg per tablet     Sig: Take 1 Tab by mouth every six (  6) hours as needed for Pain (Rib pain after fall) for up to 3 days. Max Daily Amount: 4 Tabs.     Dispense:  20 Tab     Refill:  0   ??? denosumab (XGEVA) soln injection     Sig: 1.7 mL by SubCUTAneous route once for 1 dose.     Dispense:  1.7 mL     Refill:  0   ??? degarelix (FIRMAGON) 80 mg solr injection     Sig: 80 mg by SubCUTAneous route once for 1 dose.     Dispense:  1 Each     Refill:  0       Patient did not supply own medication.      Constance Goltz

## 2018-02-17 ENCOUNTER — Encounter

## 2018-02-17 NOTE — Telephone Encounter (Signed)
Notes faxed per Vincente Liberty

## 2018-02-17 NOTE — Telephone Encounter (Signed)
I discussed with Dr Sheppard Coil pt's recent progression on Zytiga. Pt tested pos for BRCA and was considering Triton trial. Scans now show significant progression. I am concerned that trial comparrision arm is Xtandi which I think would have no chance of being effective.   I recommend that he consider chemo +/- PARP inhibitor with Dr Sheppard Coil.   Pt informed also

## 2018-02-24 MED ORDER — PREDNISONE 5 MG TAB
5 mg | ORAL_TABLET | Freq: Two times a day (BID) | ORAL | 11 refills | Status: AC
Start: 2018-02-24 — End: ?

## 2018-02-25 NOTE — Telephone Encounter (Signed)
Monthly Questionnaire for John Higgins  Are you taking your medication Zytiga as prescribed?  no     Per pt, he was told by Dr. Gearldine Bienenstock to d/c the [facility] and go on chemo (which he will start tomorrow with Dr. Sheppard Coil). Patient states he is still taking Prednisone, once a day. Patient advised that I would find out if he is supposed to stay on the Prednisone or d/c it entirely. Also advised him I would notify our pharmacy that he is no longer on the Zytiga.               Leafy Ro, LPN

## 2018-02-25 NOTE — Telephone Encounter (Signed)
Called patient and advised him of Dr. Bufford Buttner recommendation. Patient instructed to ask Dr. Ouida Sills or his staff tomorrow, whether he should stay on the Prednisone or not.       Leafy Ro, LPN

## 2018-03-13 ENCOUNTER — Institutional Professional Consult (permissible substitution): Admit: 2018-03-13 | Discharge: 2018-03-13 | Primary: Family Medicine

## 2018-03-13 DIAGNOSIS — C61 Malignant neoplasm of prostate: Secondary | ICD-10-CM

## 2018-03-13 MED ORDER — DEGARELIX 80 MG SUB-Q SOLN
80 mg | Freq: Once | SUBCUTANEOUS | 0 refills | Status: AC
Start: 2018-03-13 — End: 2018-03-13

## 2018-03-13 NOTE — Progress Notes (Signed)
John Higgins is a 77 y.o. male who is here today per the order of Dr. Given  to receive degarelix.  Patient's identity has been verified.   Dr. Norberto Sorenson  was available in the clinic as incident to provider.     Patient has been taking supplements of at least 500 mg of calcium and 400 international units of Vitamin D daily.  This is not his first ADT injection.  He has had the necessary labs drawn.  Patient is having labs done at [facility]. [facility] is now giving his Xgeva injection and has d/c Zytiga.     Degarelix  is administered to abdomen ( left) SQ without difficulty.     Patient tolerated the injection well.    Patient has been scheduled for the next injection which will be due in approximately 1 months  Patient has a follow-up appointment with provider on TBD.       Encounter Diagnosis   Name Primary?   ??? Prostate cancer metastatic to multiple sites [facility]) Yes       Orders Placed This Encounter   ??? CHEMOTHER HORMON ANTINEOPL SUB-Q/IM   ??? PR DEGARELIX INJECTION     Order Specific Question:   Charge Quantity?     Answer:   31     Order Specific Question:   Dose     Answer:   80 mg     Order Specific Question:   Site     Answer:   LEFT LOWER QUAD ABDOMEN     Order Specific Question:   Expiration Date     Answer:   02/12/2020     Order Specific Question:   Lot#     Answer:   [id number]     Order Specific Question:   Manufacturer     Answer:   Ar[employer]ana Lindau     Order Specific Question:   Perfomed by/Witnessed by:     Answer:   fh     Order Specific Question:   NDC#     Answer:   [address]-2 [[id number]]   ??? degarelix (FIRMAGON) 80 mg solr injection     Sig: 80 mg by SubCUTAneous route once for 1 dose.     Dispense:  1 Each     Refill:  0       Patient did not supply own medication.    Axel Filler         Reviewed by Drusilla Kanner, MD

## 2018-04-10 ENCOUNTER — Encounter: Primary: Family Medicine

## 2018-04-14 ENCOUNTER — Encounter: Primary: Family Medicine

## 2018-07-16 NOTE — Progress Notes (Signed)
Spoke with patient who continues to be treated with Docetaxel for his prostate cancer.  Patient indicated that he is feeling good and that his psa continues to drop.  A copy of his most recent oncology visit was printed and placed in his study binder.    Amire Gossen, BS, MA, CRC  Research Coordinator    FOR FULL DOCUMENTATION OF THIS CLINICAL TRIAL VISIT, PLEASE SEE THE SOURCE DOCUMENT, AS THIS IS THE FIRST PLACE VISIT INFORMATION FROM CLINICAL TRIAL VISITS IS RECORDED.

## 2018-09-25 ENCOUNTER — Inpatient Hospital Stay: Admit: 2018-09-25 | Payer: MEDICARE | Primary: Family Medicine

## 2018-09-25 DIAGNOSIS — E039 Hypothyroidism, unspecified: Secondary | ICD-10-CM

## 2018-09-25 LAB — CBC WITH AUTOMATED DIFF
ATYPICAL LYMPHS: 2.7 % — ABNORMAL HIGH (ref 0–0)
Acanthrocytes: 1
Anisocytosis: 1
BAND NEUTROPHILS: 3.6 % (ref 0–11)
BASOPHILS: 1.1 % (ref 0–3)
EOSINOPHILS: 0.2 % (ref 0–5)
Elliptocytes: 1
HCT: 39.7 % (ref 37.0–50.0)
HGB: 11.7 gm/dl — ABNORMAL LOW (ref 12.4–17.2)
IMMATURE GRANULOCYTES: 6.2 % — CR (ref 0.0–3.0)
LYMPHOCYTES: 14.5 % — ABNORMAL LOW (ref 28–48)
MCH: 27.3 pg (ref 23.0–34.6)
MCHC: 29.5 gm/dl — ABNORMAL LOW (ref 30.0–36.0)
MCV: 92.8 fL (ref 80.0–98.0)
MONOCYTES: 15.5 % — ABNORMAL HIGH (ref 1–13)
MPV: 10.4 fL — ABNORMAL HIGH (ref 6.0–10.0)
MYELOCYTES: 5.5 % — ABNORMAL HIGH (ref 0–0)
Macrocytes: 1
Microcytes: 1
NEUTROPHILS: 58.2 % (ref 34–64)
NRBC: 4
Ovalocytes-DIF: 1
PLATELET COMMENTS: NORMAL
PLATELET: 451 10*3/uL — ABNORMAL HIGH (ref 140–450)
Poikilocytosis: 2
RBC: 4.28 M/uL (ref 3.80–5.70)
RDW-SD: 73.3 — ABNORMAL HIGH (ref 35.1–43.9)
Schistocytes: 1
Smudge cells: 14.5
WBC: 8.8 10*3/uL (ref 4.0–11.0)

## 2018-09-25 LAB — METABOLIC PANEL, COMPREHENSIVE
ALT (SGPT): 28 U/L (ref 12–78)
AST (SGOT): 15 U/L (ref 15–37)
Albumin: 3.2 gm/dl — ABNORMAL LOW (ref 3.4–5.0)
Alk. phosphatase: 58 U/L (ref 45–117)
Anion gap: 10 mmol/L (ref 5–15)
BUN: 20 mg/dl (ref 7–25)
Bilirubin, total: 0.7 mg/dl (ref 0.2–1.0)
CO2: 25 mEq/L (ref 21–32)
Calcium: 9.4 mg/dl (ref 8.5–10.1)
Chloride: 104 mEq/L (ref 98–107)
Creatinine: 1.1 mg/dl (ref 0.6–1.3)
GFR est AA: >60
GFR est non-AA: >60
Glucose: 147 mg/dl — ABNORMAL HIGH (ref 74–106)
Potassium: 4.2 mEq/L (ref 3.5–5.1)
Protein, total: 6.7 gm/dl (ref 6.4–8.2)
Sodium: 139 mEq/L (ref 136–145)

## 2018-09-25 LAB — LIPID PANEL
CHOL/HDL Ratio: 4.9 Ratio (ref 0.0–5.0)
Cholesterol, total: 292 mg/dl — ABNORMAL HIGH (ref 140–199)
HDL Cholesterol: 60 mg/dl (ref 40–96)
LDL, calculated: 192 mg/dl — ABNORMAL HIGH (ref 0–130)
Triglyceride: 200 mg/dl — ABNORMAL HIGH (ref 29–150)

## 2018-09-25 LAB — TSH 3RD GENERATION: TSH: 2.36 u[IU]/mL (ref 0.358–3.740)

## 2018-09-25 LAB — MICROALBUMIN, UR, RAND W/ MICROALB/CREAT RATIO
Creatinine, urine random: 121 mg/dl (ref 30.0–125.0)
Microalbumin,urine random: 12.9 mg/L (ref 0.0–29.9)
Microalbumin/Creat ratio (mg/g creat): 11 mg/g (ref 0–30)

## 2018-09-25 LAB — VITAMIN D, 25 HYDROXY: Vitamin D, 25-OH, Total: 47.3 ng/ml (ref 30.0–100.0)

## 2018-09-25 LAB — HEMOGLOBIN A1C W/O EAG: Hemoglobin A1c: 8.6 % — ABNORMAL HIGH (ref 4.2–5.6)

## 2019-01-25 ENCOUNTER — Telehealth: Payer: Self-pay | Admitting: Oncology

## 2019-01-25 NOTE — Telephone Encounter (Signed)
I spoke to the pt' social worker, Adria Dill today and she will have the pt's records faxed to our office. I provided her with the new patient fax number.

## 2019-01-25 NOTE — Telephone Encounter (Signed)
Received a call from the pt's daughter Michael Frey who will be moving her father to [name] from [name]. An appt has been scheduled for the pt to see Dr. Alen Blew on 4/1 at 11am. Mrs. Michael Frey has agreed to the appt date and time.

## 2019-02-04 ENCOUNTER — Inpatient Hospital Stay: Payer: Medicare Other | Attending: Oncology | Admitting: Oncology

## 2019-02-04 ENCOUNTER — Other Ambulatory Visit: Payer: Self-pay

## 2019-02-04 VITALS — BP 123/77 | HR 100 | Temp 97.9°F | Resp 18 | Ht 68.0 in | Wt 150.9 lb

## 2019-02-04 DIAGNOSIS — Z86711 Personal history of pulmonary embolism: Secondary | ICD-10-CM | POA: Insufficient documentation

## 2019-02-04 DIAGNOSIS — C7951 Secondary malignant neoplasm of bone: Secondary | ICD-10-CM | POA: Diagnosis not present

## 2019-02-04 DIAGNOSIS — D473 Essential (hemorrhagic) thrombocythemia: Secondary | ICD-10-CM

## 2019-02-04 DIAGNOSIS — C7800 Secondary malignant neoplasm of unspecified lung: Secondary | ICD-10-CM | POA: Diagnosis not present

## 2019-02-04 DIAGNOSIS — Z9081 Acquired absence of spleen: Secondary | ICD-10-CM

## 2019-02-04 DIAGNOSIS — Z7901 Long term (current) use of anticoagulants: Secondary | ICD-10-CM

## 2019-02-04 DIAGNOSIS — Z5111 Encounter for antineoplastic chemotherapy: Secondary | ICD-10-CM | POA: Insufficient documentation

## 2019-02-04 DIAGNOSIS — R9721 Rising PSA following treatment for malignant neoplasm of prostate: Secondary | ICD-10-CM

## 2019-02-04 DIAGNOSIS — Z7902 Long term (current) use of antithrombotics/antiplatelets: Secondary | ICD-10-CM

## 2019-02-04 DIAGNOSIS — Z79899 Other long term (current) drug therapy: Secondary | ICD-10-CM

## 2019-02-04 DIAGNOSIS — I2510 Atherosclerotic heart disease of native coronary artery without angina pectoris: Secondary | ICD-10-CM

## 2019-02-04 DIAGNOSIS — J168 Pneumonia due to other specified infectious organisms: Secondary | ICD-10-CM

## 2019-02-04 DIAGNOSIS — C61 Malignant neoplasm of prostate: Secondary | ICD-10-CM | POA: Insufficient documentation

## 2019-02-04 DIAGNOSIS — E119 Type 2 diabetes mellitus without complications: Secondary | ICD-10-CM | POA: Insufficient documentation

## 2019-02-04 DIAGNOSIS — Z5189 Encounter for other specified aftercare: Secondary | ICD-10-CM | POA: Insufficient documentation

## 2019-02-04 NOTE — Progress Notes (Signed)
Reason for the request:    Prostate cancer  HPI: I was asked by Dr. Sheppard Coil  to evaluate [name] for the evaluation of prostate cancer.  He is a 78 year old man relocated from [place] to [place] to stay with his daughter in the last week.  He was diagnosed with prostate cancer in 2017.  At that time he presented with a Gleason score of 5+ 5 = with advanced disease at the lumbar spine concerning for pelvic metastasis.  He was started on degarelix and Xgeva as well as Xtandi as a part of study.  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  He was started on docetaxel 75 mg per metered square with growth factor support since May 2 of 2019.   He is currently receiving docetaxel with prednisone as third line for his prostate cancer started on May 2 of 2019.  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  Prednisone was held and subsequently restarted 5 mg twice a day.  He continues to receive Xgeva and degarelix on a monthly basis.  Is PSA in March 2020 was 14.5 which is increased from as low as 5.8 in November 2019.  His PSA in February 2020 was 9.0.  On scan obtained on January 6 of 2020 which showed little change compared to prior study in October 2019.  CT scan obtained in January 2020 showed interval improvement of the right iliopsoas muscle metastatic lesion.  There is also slight interval improvement of his right external iliac lymph node.  CT scan of chest in February 2020 showed possible infectious process that was treated as cryptococcal pneumonia.  Clinically, he reports last cycle of chemotherapy caused him to be more fatigued, nauseated and have diarrhea.  The symptoms has improved since that time.  He has not reported any major changes in his bone pain and uses tramadol only.  He ambulates with the help of a cane and has not reported any falls or syncope.  Denies any  recent hospitalizations or illnesses in the last few weeks.     He does not report any headaches, blurry vision, syncope or seizures. Does not report any fevers, chills or sweats.  Does not report any cough, wheezing or hemoptysis.  Does not report any chest pain, palpitation, orthopnea or leg edema.  Does not report any nausea, vomiting or abdominal pain.  Does not report any constipation or diarrhea.  Does not report any skeletal complaints.    Does not report frequency, urgency or hematuria.  Does not report any skin rashes or lesions. Does not report any heat or cold intolerance.  Does not report any lymphadenopathy or petechiae.  Does not report any anxiety or depression.  Remaining review of systems is negative.    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  Allergies to naproxen.  Medication personally reviewed and updated in his chart.  Currently on prednisone twice a day as well as Plavix.  He is no longer taking Lasix.  He does take metoprolol, isosorbide dinitrate and aspirin.  No family history of prostate cancer.  Social History   Socioeconomic History  . Marital status: Divorced    Spouse name: Not on file  . Number of children: Not on file  . Years of education: Not on file  . Highest education level: Not on file  Occupational History  .  Not on file  Social Needs  . Financial resource strain: Not on file  . Food insecurity:    Worry: Not on file    Inability: Not on file  . Transportation needs:    Medical: Not on file    Non-medical: Not on file  Tobacco Use  . Smoking status: Not on file  Substance and Sexual Activity  . Alcohol use: Not on file  . Drug use: Not on file  . Sexual activity: Not on file  Lifestyle  . Physical activity:    Days per week: Not on file    Minutes per session: Not on file  . Stress: Not on file  Relationships  . Social connections:    Talks on  phone: Not on file    Gets together: Not on file    Attends religious service: Not on file    Active member of club or organization: Not on file    Attends meetings of clubs or organizations: Not on file    Relationship status: Not on file  . Intimate partner violence:    Fear of current or ex partner: Not on file    Emotionally abused: Not on file    Physically abused: Not on file    Forced sexual activity: Not on file  Other Topics Concern  . Not on file  Social History Narrative  . Not on file  :  Pertinent items are noted in HPI.  Exam: Blood pressure 123/77, pulse 100, temperature 97.9 F (36.6 C), temperature source Oral, resp. rate 18, height 5\' 8"  (1.727 m), weight 150 lb 14.4 oz (68.4 kg), SpO2 99 %.  ECOG 1  General appearance: alert and cooperative appeared without distress.  Slightly frail. Head: atraumatic without any abnormalities. Eyes: conjunctivae/corneas clear. PERRL.  Sclera anicteric. Throat: lips, mucosa, and tongue normal; without oral thrush or ulcers. Resp: clear to auscultation bilaterally without rhonchi, wheezes or dullness to percussion. Cardio: regular rate and rhythm, S1, S2 normal, no murmur, click, rub or gallop GI: soft, non-tender; bowel sounds normal; no masses,  no organomegaly Skin: Skin color, texture, turgor normal. No rashes or lesions Lymph nodes: Cervical, supraclavicular, and axillary nodes normal. Neurologic: Grossly normal without any motor, sensory or deep tendon reflexes. Musculoskeletal: No joint deformity or effusion.    Assessment and Plan:   78 year old man with the following:  1.  Castration-resistant prostate cancer with disease to the bone diagnosed in 2017.  He had a Gleason score 10 with disease lymphadenopathy and bone and possible pulmonary metastasis.  He was treated initially with androgen deprivation with enzalutamide.  He developed progression of disease in 2019 and has been on docetaxel with prednisone  intermittently since that time.  His last chemotherapy was given on March 12 of 2020.  The natural course of his disease was discussed today and treatment options were reviewed.  The first step is to update his staging work-up which is due at this time and I recommend repeating a CT scan as well as bone scan in the near future.  I would like to also update his recent PSA and determine the next course of action.  Options of therapy will include resuming Taxotere chemotherapy, Jevtana chemotherapy or short treatment break.  The risks and benefits of the rationale for doing all these approaches were discussed today and is agreeable to proceed.  Next up is to staging with CT scan and bone scan in the immediate future.  2.  IV access: Port-A-Cath is  in place and we will update port tip placement by imaging studies and Port-A-Cath will be in use moving forward.  3.  Oral prednisone: I have asked him to decrease his prednisone to 5 mg daily and potentially discontinue in the future.  4.  Androgen depravation: Risks and benefits of continuing this indefinitely was reviewed today.  I have recommended switching to Lupron 30 mg every 4 months for convenience.  Complications associated with this therapy including weight gain and osteoporosis.  5.  Bone directed therapy: Resume Xgeva on a monthly basis.  I recommended continuing calcium and vitamin D supplements.  Long-term complications including hypocalcemia and osteonecrosis of the jaw.  6.  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  He is currently on Diflucan.  7.  Goals of care and prognosis: His disease is very aggressive and aggressive therapy is warranted.  His performance status is adequate and I recommended continuing this approach.  His disease is incurable however.  8.  Follow-up: Is to be determined after completing his staging work-up.   60  minutes was spent with the patient face-to-face  today.  More than 50% of time was spent on reviewing imaging studies, laboratory data, records from outside treating physician as well as discussing his disease status and plan of care.     Thank you for the referral. A copy of this consult has been forwarded to the requesting physician.

## 2019-02-05 ENCOUNTER — Telehealth: Payer: Self-pay | Admitting: Oncology

## 2019-02-05 NOTE — Telephone Encounter (Signed)
Scheduled appt per 4/2 sch message.  Patient daughter aware of the scheduled appts.

## 2019-02-09 ENCOUNTER — Other Ambulatory Visit: Payer: Self-pay

## 2019-02-09 ENCOUNTER — Ambulatory Visit (HOSPITAL_COMMUNITY)
Admission: RE | Admit: 2019-02-09 | Discharge: 2019-02-09 | Disposition: A | Discharge status: Home / Self Care | Payer: Medicare Other | Source: Ambulatory Visit | Attending: Oncology | Admitting: Oncology

## 2019-02-09 ENCOUNTER — Inpatient Hospital Stay: Payer: Medicare Other

## 2019-02-09 DIAGNOSIS — C61 Malignant neoplasm of prostate: Secondary | ICD-10-CM | POA: Insufficient documentation

## 2019-02-09 DIAGNOSIS — Z7902 Long term (current) use of antithrombotics/antiplatelets: Secondary | ICD-10-CM | POA: Diagnosis not present

## 2019-02-09 DIAGNOSIS — C7951 Secondary malignant neoplasm of bone: Secondary | ICD-10-CM | POA: Diagnosis present

## 2019-02-09 DIAGNOSIS — E119 Type 2 diabetes mellitus without complications: Secondary | ICD-10-CM | POA: Diagnosis not present

## 2019-02-09 DIAGNOSIS — Z86711 Personal history of pulmonary embolism: Secondary | ICD-10-CM | POA: Diagnosis not present

## 2019-02-09 DIAGNOSIS — Z5189 Encounter for other specified aftercare: Secondary | ICD-10-CM | POA: Diagnosis not present

## 2019-02-09 DIAGNOSIS — Z5111 Encounter for antineoplastic chemotherapy: Secondary | ICD-10-CM | POA: Diagnosis present

## 2019-02-09 LAB — CBC WITH DIFFERENTIAL (CANCER CENTER ONLY)
Abs Immature Granulocytes: 0.06 10*3/uL (ref 0.00–0.07)
Basophils Absolute: 0.1 10*3/uL (ref 0.0–0.1)
Basophils Relative: 1 %
Eosinophils Absolute: 0.2 10*3/uL (ref 0.0–0.5)
Eosinophils Relative: 2 %
HCT: 34.9 % — ABNORMAL LOW (ref 39.0–52.0)
Hemoglobin: 10.4 g/dL — ABNORMAL LOW (ref 13.0–17.0)
Immature Granulocytes: 1 %
Lymphocytes Relative: 22 %
Lymphs Abs: 2.5 10*3/uL (ref 0.7–4.0)
MCH: 26.9 pg (ref 26.0–34.0)
MCHC: 29.8 g/dL — ABNORMAL LOW (ref 30.0–36.0)
MCV: 90.2 fL (ref 80.0–100.0)
Monocytes Absolute: 0.8 10*3/uL (ref 0.1–1.0)
Monocytes Relative: 8 %
Neutro Abs: 7.6 10*3/uL (ref 1.7–7.7)
Neutrophils Relative %: 66 %
Platelet Count: 441 10*3/uL — ABNORMAL HIGH (ref 150–400)
RBC: 3.87 MIL/uL — ABNORMAL LOW (ref 4.22–5.81)
RDW: 21 % — ABNORMAL HIGH (ref 11.5–15.5)
WBC Count: 11.2 10*3/uL — ABNORMAL HIGH (ref 4.0–10.5)
nRBC: 0 % (ref 0.0–0.2)

## 2019-02-09 LAB — CMP (CANCER CENTER ONLY)
ALT: 20 U/L (ref 0–44)
AST: 25 U/L (ref 15–41)
Albumin: 2.6 g/dL — ABNORMAL LOW (ref 3.5–5.0)
Alkaline Phosphatase: 78 U/L (ref 38–126)
Anion gap: 12 (ref 5–15)
BUN: 11 mg/dL (ref 8–23)
CO2: 26 mmol/L (ref 22–32)
Calcium: 8.6 mg/dL — ABNORMAL LOW (ref 8.9–10.3)
Chloride: 105 mmol/L (ref 98–111)
Creatinine: 0.86 mg/dL (ref 0.61–1.24)
GFR, Est AFR Am: >60 mL/min (ref >60)
GFR, Estimated: >60 mL/min (ref >60)
Glucose, Bld: 153 mg/dL — ABNORMAL HIGH (ref 70–99)
Potassium: 3.9 mmol/L (ref 3.5–5.1)
Sodium: 143 mmol/L (ref 135–145)
Total Bilirubin: 0.5 mg/dL (ref 0.3–1.2)
Total Protein: 6.2 g/dL — ABNORMAL LOW (ref 6.5–8.1)

## 2019-02-09 MED ORDER — IOHEXOL 300 MG/ML  SOLN
100.0000 mL | Freq: Once | INTRAMUSCULAR | Status: AC | PRN
  Administered 2019-02-09: 100 mL via INTRAVENOUS

## 2019-02-09 MED ORDER — HEPARIN SOD (PORK) LOCK FLUSH 100 UNIT/ML IV SOLN
INTRAVENOUS | Status: AC
  Administered 2019-02-09: 13:00:00 500 [IU] via INTRAVENOUS
  Filled 2019-02-09: qty 5

## 2019-02-09 MED ORDER — HEPARIN SOD (PORK) LOCK FLUSH 100 UNIT/ML IV SOLN
500.0000 [IU] | Freq: Once | INTRAVENOUS | Status: AC
  Administered 2019-02-09: 13:00:00 500 [IU] via INTRAVENOUS

## 2019-02-09 MED ORDER — SODIUM CHLORIDE (PF) 0.9 % IJ SOLN
INTRAMUSCULAR | Status: AC
  Filled 2019-02-09: qty 50

## 2019-02-10 ENCOUNTER — Other Ambulatory Visit: Payer: Self-pay | Admitting: *Deleted

## 2019-02-10 ENCOUNTER — Other Ambulatory Visit: Payer: Self-pay | Admitting: Oncology

## 2019-02-10 DIAGNOSIS — C61 Malignant neoplasm of prostate: Secondary | ICD-10-CM

## 2019-02-10 LAB — TESTOSTERONE: Testosterone: <3 ng/dL — ABNORMAL LOW (ref 264–916)

## 2019-02-10 LAB — PROSTATE-SPECIFIC AG, SERUM (LABCORP): Prostate Specific Ag, Serum: 15.4 ng/mL — ABNORMAL HIGH (ref 0.0–4.0)

## 2019-02-10 MED ORDER — LIDOCAINE-PRILOCAINE 2.5-2.5 % EX CREA: 1.0000 "application " | TOPICAL_CREAM | CUTANEOUS | 0 refills | Status: DC | PRN

## 2019-02-10 MED ORDER — PROCHLORPERAZINE MALEATE 10 MG PO TABS: 10.0000 mg | ORAL_TABLET | Freq: Four times a day (QID) | ORAL | 0 refills | Status: AC | PRN

## 2019-02-10 MED ORDER — PROCHLORPERAZINE MALEATE 10 MG PO TABS: 10.0000 mg | ORAL_TABLET | Freq: Four times a day (QID) | ORAL | 0 refills | Status: DC | PRN

## 2019-02-10 NOTE — Progress Notes (Signed)
The results of the CT scan and laboratory testing on February 09, 2019 was personally reviewed and discussed with the patient over the phone today.  I recommended proceeding with chemotherapy utilizing a different agent other than Taxotere.  His PSA appears to be rising despite treatment which indicative of possible relapse.  Options of therapy were reviewed today which include Jevtana chemotherapy among others.  Given his high risk disease status I will proceed with Jevtana chemotherapy in the near future.  Complication associated with this treatment was discussed today with the patient over the phone.  These were include nausea, fatigue, myelosuppression, diarrhea.  He is at risk of developing neutropenic sepsis and growth factor support will be needed.  He will be contacted with appointment to start treatment in the near future.

## 2019-02-10 NOTE — Progress Notes (Signed)
START ON PATHWAY REGIMEN - Prostate     A cycle is every 21 days.:     Cabazitaxel      Prednisone   **Always confirm dose/schedule in your pharmacy ordering system**  Patient Characteristics: Adenocarcinoma, Distant Metastases, Castration Resistant, Symptomatic, Prior Docetaxel/Docetaxel Ineligible Histology: Adenocarcinoma Therapeutic Status: Distant Metastases  Intent of Therapy: Non-Curative / Palliative Intent, Discussed with Patient

## 2019-02-11 ENCOUNTER — Inpatient Hospital Stay: Payer: Medicare Other

## 2019-02-11 ENCOUNTER — Other Ambulatory Visit: Payer: Self-pay

## 2019-02-11 VITALS — BP 112/58 | HR 88 | Temp 97.8°F | Resp 18

## 2019-02-11 DIAGNOSIS — C61 Malignant neoplasm of prostate: Secondary | ICD-10-CM

## 2019-02-11 DIAGNOSIS — Z5111 Encounter for antineoplastic chemotherapy: Secondary | ICD-10-CM | POA: Diagnosis not present

## 2019-02-11 MED ORDER — LEUPROLIDE ACETATE (4 MONTH) 30 MG IM KIT
30.0000 mg | PACK | Freq: Once | INTRAMUSCULAR | Status: AC
  Administered 2019-02-11: 30 mg via INTRAMUSCULAR
  Filled 2019-02-11: qty 30

## 2019-02-11 MED ORDER — DENOSUMAB 120 MG/1.7ML ~~LOC~~ SOLN
SUBCUTANEOUS | Status: AC
  Filled 2019-02-11: qty 1.7

## 2019-02-11 MED ORDER — DENOSUMAB 120 MG/1.7ML ~~LOC~~ SOLN
120.0000 mg | Freq: Once | SUBCUTANEOUS | Status: AC
  Administered 2019-02-11: 120 mg via SUBCUTANEOUS

## 2019-02-11 NOTE — Patient Instructions (Signed)
Denosumab injection What is this medicine? DENOSUMAB (den oh sue mab) slows bone breakdown. Prolia is used to treat osteoporosis in women after menopause and in men, and in people who are taking corticosteroids for 6 months or more. Xgeva is used to treat a high calcium level due to cancer and to prevent bone fractures and other bone problems caused by multiple myeloma or cancer bone metastases. Xgeva is also used to treat giant cell tumor of the bone. This medicine may be used for other purposes; ask your health care provider or pharmacist if you have questions. COMMON BRAND NAME(S): Prolia, XGEVA What should I tell my health care provider before I take this medicine? They need to know if you have any of these conditions: -dental disease -having surgery or tooth extraction -infection -kidney disease -low levels of calcium or Vitamin D in the blood -malnutrition -on hemodialysis -skin conditions or sensitivity -thyroid or parathyroid disease -an unusual reaction to denosumab, other medicines, foods, dyes, or preservatives -pregnant or trying to get pregnant -breast-feeding How should I use this medicine? This medicine is for injection under the skin. It is given by a health care professional in a hospital or clinic setting. A special MedGuide will be given to you before each treatment. Be sure to read this information carefully each time. For Prolia, talk to your pediatrician regarding the use of this medicine in children. Special care may be needed. For Xgeva, talk to your pediatrician regarding the use of this medicine in children. While this drug may be prescribed for children as young as 13 years for selected conditions, precautions do apply. Overdosage: If you think you have taken too much of this medicine contact a poison control center or emergency room at once. NOTE: This medicine is only for you. Do not share this medicine with others. What if I miss a dose? It is important not to  miss your dose. Call your doctor or health care professional if you are unable to keep an appointment. What may interact with this medicine? Do not take this medicine with any of the following medications: -other medicines containing denosumab This medicine may also interact with the following medications: -medicines that lower your chance of fighting infection -steroid medicines like prednisone or cortisone This list may not describe all possible interactions. Give your health care provider a list of all the medicines, herbs, non-prescription drugs, or dietary supplements you use. Also tell them if you smoke, drink alcohol, or use illegal drugs. Some items may interact with your medicine. What should I watch for while using this medicine? Visit your doctor or health care professional for regular checks on your progress. Your doctor or health care professional may order blood tests and other tests to see how you are doing. Call your doctor or health care professional for advice if you get a fever, chills or sore throat, or other symptoms of a cold or flu. Do not treat yourself. This drug may decrease your body's ability to fight infection. Try to avoid being around people who are sick. You should make sure you get enough calcium and vitamin D while you are taking this medicine, unless your doctor tells you not to. Discuss the foods you eat and the vitamins you take with your health care professional. See your dentist regularly. Brush and floss your teeth as directed. Before you have any dental work done, tell your dentist you are receiving this medicine. Do not become pregnant while taking this medicine or for 5 months   after stopping it. Talk with your doctor or health care professional about your birth control options while taking this medicine. Women should inform their doctor if they wish to become pregnant or think they might be pregnant. There is a potential for serious side effects to an unborn  child. Talk to your health care professional or pharmacist for more information. What side effects may I notice from receiving this medicine? Side effects that you should report to your doctor or health care professional as soon as possible: -allergic reactions like skin rash, itching or hives, swelling of the face, lips, or tongue -bone pain -breathing problems -dizziness -jaw pain, especially after dental work -redness, blistering, peeling of the skin -signs and symptoms of infection like fever or chills; cough; sore throat; pain or trouble passing urine -signs of low calcium like fast heartbeat, muscle cramps or muscle pain; pain, tingling, numbness in the hands or feet; seizures -unusual bleeding or bruising -unusually weak or tired Side effects that usually do not require medical attention (report to your doctor or health care professional if they continue or are bothersome): -constipation -diarrhea -headache -joint pain -loss of appetite -muscle pain -runny nose -tiredness -upset stomach This list may not describe all possible side effects. Call your doctor for medical advice about side effects. You may report side effects to FDA at 1-800-FDA-1088. Where should I keep my medicine? This medicine is only given in a clinic, doctor's office, or other health care setting and will not be stored at home. NOTE: This sheet is a summary. It may not cover all possible information. If you have questions about this medicine, talk to your doctor, pharmacist, or health care provider.  2019 Elsevier/Gold Standard (2018-02-27 16:10:44)  Leuprolide injection What is this medicine? LEUPROLIDE (loo PROE lide) is a man-made hormone. It is used to treat the symptoms of prostate cancer. This medicine may also be used to treat children with early onset of puberty. It may be used for other hormonal conditions. This medicine may be used for other purposes; ask your health care provider or pharmacist if  you have questions. COMMON BRAND NAME(S): Lupron What should I tell my health care provider before I take this medicine? They need to know if you have any of these conditions: -diabetes -heart disease or previous heart attack -high blood pressure -high cholesterol -pain or difficulty passing urine -spinal cord metastasis -stroke -tobacco smoker -an unusual or allergic reaction to leuprolide, benzyl alcohol, other medicines, foods, dyes, or preservatives -pregnant or trying to get pregnant -breast-feeding How should I use this medicine? This medicine is for injection under the skin or into a muscle. You will be taught how to prepare and give this medicine. Use exactly as directed. Take your medicine at regular intervals. Do not take your medicine more often than directed. It is important that you put your used needles and syringes in a special sharps container. Do not put them in a trash can. If you do not have a sharps container, call your pharmacist or healthcare provider to get one. A special MedGuide will be given to you by the pharmacist with each prescription and refill. Be sure to read this information carefully each time. Talk to your pediatrician regarding the use of this medicine in children. While this medicine may be prescribed for children as young as 8 years for selected conditions, precautions do apply. Overdosage: If you think you have taken too much of this medicine contact a poison control center or emergency room at   NOTE: This medicine is only for you. Do not share this medicine with others. What if I miss a dose? If you miss a dose, take it as soon as you can. If it is almost time for your next dose, take only that dose. Do not take double or extra doses. What may interact with this medicine? Do not take this medicine with any of the following medications: -chasteberry This medicine may also interact with the following medications: -herbal or dietary supplements,  like black cohosh or DHEA -male hormones, like estrogens or progestins and birth control pills, patches, rings, or injections -male hormones, like testosterone This list may not describe all possible interactions. Give your health care provider a list of all the medicines, herbs, non-prescription drugs, or dietary supplements you use. Also tell them if you smoke, drink alcohol, or use illegal drugs. Some items may interact with your medicine. What should I watch for while using this medicine? Visit your doctor or health care professional for regular checks on your progress. During the first week, your symptoms may get worse, but then will improve as you continue your treatment. You may get hot flashes, increased bone pain, increased difficulty passing urine, or an aggravation of nerve symptoms. Discuss these effects with your doctor or health care professional, some of them may improve with continued use of this medicine. Male patients may experience a menstrual cycle or spotting during the first 2 months of therapy with this medicine. If this continues, contact your doctor or health care professional. What side effects may I notice from receiving this medicine? Side effects that you should report to your doctor or health care professional as soon as possible: -allergic reactions like skin rash, itching or hives, swelling of the face, lips, or tongue -breathing problems -chest pain -depression or memory disorders -pain in your legs or groin -pain at site where injected -severe headache -swelling of the feet and legs -visual changes -vomiting Side effects that usually do not require medical attention (report to your doctor or health care professional if they continue or are bothersome): -breast swelling or tenderness -decrease in sex drive or performance -diarrhea -hot flashes -loss of appetite -muscle, joint, or bone pains -nausea -redness or irritation at site where injected -skin  problems or acne This list may not describe all possible side effects. Call your doctor for medical advice about side effects. You may report side effects to FDA at 1-800-FDA-1088. Where should I keep my medicine? Keep out of the reach of children. Store below 25 degrees C (77 degrees F). Do not freeze. Protect from light. Do not use if it is not clear or if there are particles present. Throw away any unused medicine after the expiration date. NOTE: This sheet is a summary. It may not cover all possible information. If you have questions about this medicine, talk to your doctor, pharmacist, or health care provider.  2019 Elsevier/Gold Standard (2016-06-10 10:54:35)

## 2019-02-12 ENCOUNTER — Encounter: Payer: Self-pay | Admitting: Medical Oncology

## 2019-02-15 ENCOUNTER — Telehealth: Payer: Self-pay | Admitting: Oncology

## 2019-02-15 NOTE — Progress Notes (Signed)
Received a call from [place] voicing her concerns because she was not able to attend her dad's appointment with Dr. Alen Blew due to COVID-19 restrictions. She recently moved him to [facility] so she could be informed and active in his care. Her father's memory is not great and he was unable to recall a lot of the visit or thefollow up phone call with labs and plans for a new chemo drug.  We dicussed the restaging scans and lab results. Per Dr. Alen Blew, he has disease progression on the current chemo and he is changing treatment to [facility] and Neulasta. We discussed the drugs and side effects. I informed her that the chemo education nurse will contact her father to review these medications. I explained that I am here as a resource but if her father is having treatment issues or other physical problems she needs to call Dr.Shadad's nurses or use My-Chart. She was very thankful for the information and states the call has helped to reduce her anxiety. I asked her to reach out to me if I can assist her in any way. She voiced understanding.

## 2019-02-15 NOTE — Telephone Encounter (Signed)
Spoke with dtr re next appointments for 4/15 (ched/telephone call) and 4/24. Patient will get updated schedule 4/24.

## 2019-02-16 ENCOUNTER — Telehealth: Payer: Self-pay | Admitting: Internal Medicine

## 2019-02-16 NOTE — Telephone Encounter (Signed)
COVID-19 Pre-Screening Questions: ° °Do you currently have a fever (>100 °F), chills or unexplained body aches? No  ° °Are you currently experiencing new cough, shortness of breath, sore throat, runny nose? No  °•  °Have you recently travelled outside the state of [place] in the last 14 days? No  °•  °Have you been in contact with someone that is currently pending confirmation of Covid19 testing or has been confirmed to have the Covid19 virus?  No  °

## 2019-02-17 ENCOUNTER — Inpatient Hospital Stay: Payer: Medicare Other

## 2019-02-17 ENCOUNTER — Other Ambulatory Visit: Payer: Self-pay

## 2019-02-17 ENCOUNTER — Encounter: Payer: Self-pay | Admitting: Internal Medicine

## 2019-02-17 ENCOUNTER — Telehealth: Payer: Self-pay | Admitting: *Deleted

## 2019-02-17 ENCOUNTER — Ambulatory Visit (INDEPENDENT_AMBULATORY_CARE_PROVIDER_SITE_OTHER): Payer: Medicare Other | Admitting: Internal Medicine

## 2019-02-17 DIAGNOSIS — B450 Pulmonary cryptococcosis: Secondary | ICD-10-CM

## 2019-02-17 MED ORDER — FLUCONAZOLE 200 MG PO TABS: 200.0000 mg | ORAL_TABLET | Freq: Two times a day (BID) | ORAL | 11 refills | Status: DC

## 2019-02-17 NOTE — Progress Notes (Signed)
[facility]      Reason for Consult: Cryptococcal pneumonitis    Referring Physician: Dr. Alen Blew    Patient ID: Michael Frey, male    DOB: Apr 16, 1941, 78 y.o.   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  He has a history of prostate cancer and getting chemotherapy, going to restart this week.  He was noted to have pulmonary nodules on a CT and follow up BAL positive for Cryptococcus neoformans.  LP negative for infection in the CNS.  Started on fluconazole 200 mg twice a day.  Has had some nausea with it but overall tolerating.  Started December 21, 2018.  Moved here from [place] to be with family.  Recent CT done and nodules persistent, does not appear to have any change in size.    PMH: prostate cancer with metastasis to bone  Prior to Admission medications   Medication Sig Start Date End Date Taking? Authorizing Provider  acetaminophen (TYLENOL) 500 MG tablet Take 500 mg by mouth every 6 (six) hours as needed (2 tabs as needed for pain).   Yes [provider]  aspirin EC 81 MG tablet Take 81 mg by mouth at bedtime.   Yes [provider]  busPIRone (BUSPAR) 7.5 MG tablet Take 7.5 mg by mouth 2 (two) times daily as needed (for anxiety).   Yes [provider]  clopidogrel (PLAVIX) 75 MG tablet Take 75 mg by mouth daily.   Yes [provider]  fluconazole (DIFLUCAN) 200 MG tablet Take 200 mg by mouth 2 times daily at 12 noon and 4 pm. For 30 days   Yes [provider]  furosemide (LASIX) 20 MG tablet Take 20 mg by mouth daily.   Yes [provider]  Insulin Glargine, 1 Unit Dial, (TOUJEO SOLOSTAR) 300 UNIT/ML SOPN Inject 15 Units into the skin 2 (two) times daily.   Yes [provider]  isosorbide mononitrate (IMDUR) 30 MG 24 hr tablet Take 30 mg by mouth daily.   Yes [provider]  levothyroxine (SYNTHROID, LEVOTHROID) 25 MCG tablet Take 25 mcg  by mouth daily before breakfast.   Yes [provider]  lidocaine-prilocaine (EMLA) cream Apply 1 application topically as needed. 02/10/19  Yes Wyatt Portela, MD  metFORMIN (GLUCOPHAGE) 1000 MG tablet Take 1,000 mg by mouth 2 (two) times daily with a meal.   Yes [provider]  metoprolol succinate (TOPROL-XL) 25 MG 24 hr tablet Take 25 mg by mouth daily.   Yes [provider]  nitroGLYCERIN (NITROSTAT) 0.4 MG SL tablet Place 0.4 mg under the tongue every 5 (five) minutes as needed for chest pain.   Yes [provider]  predniSONE (DELTASONE) 5 MG tablet Take 5 mg by mouth 2 (two) times daily with a meal. At 6 am and noon   Yes [provider]  rosuvastatin (CRESTOR) 10 MG tablet Take 10 mg by mouth daily.   Yes [provider]  tamsulosin (FLOMAX) 0.4 MG CAPS capsule Take 0.4 mg by mouth daily after supper.   Yes [provider]  traMADol (ULTRAM) 50 MG tablet Take 50 mg by mouth every 6 (six) hours as needed for moderate pain.   Yes [provider]  prochlorperazine (COMPAZINE) 10 MG tablet Take 1 tablet (10 mg total) by mouth every 6 (six) hours as needed for nausea or vomiting. Patient not taking: Reported on 02/17/2019 02/10/19  Wyatt Portela, MD    Allergies  Allergen Reactions  . Naproxen     Social History   Tobacco Use  . Smoking status: Never Smoker  . Smokeless tobacco: Never Used  Substance Use Topics  . Alcohol use: Not on file  . Drug use: Not on file    [facility]: father died of stroke  Review of Systems  Constitutional: negative for fevers, chills and sweats Respiratory: negative for cough, sputum or hemoptysis Gastrointestinal: negative for diarrhea; positive for occasional nausea Musculoskeletal: negative for myalgias and arthralgias All other systems reviewed and are negative    Constitutional: in no apparent distress  Vitals:   02/17/19 1346  BP: 100/66  Pulse: (!) 108  Temp: (!) 97.5 F  (36.4 C)   EYES: anicteric ENMT: no thrush Cardiovascular: Cor RRR Respiratory: CTA B; normal respiratory effort Musculoskeletal: no pedal edema noted Skin: negatives: no rash Neuro: non-focal  Labs: Lab Results  Component Value Date   WBC 11.2 (H) 02/09/2019   HGB 10.4 (L) 02/09/2019   HCT 34.9 (L) 02/09/2019   MCV 90.2 02/09/2019   PLT 441 (H) 02/09/2019    Lab Results  Component Value Date   CREATININE 0.86 02/09/2019   BUN 11 02/09/2019   NA 143 02/09/2019   K 3.9 02/09/2019   CL 105 02/09/2019   CO2 26 02/09/2019    Lab Results  Component Value Date   ALT 20 02/09/2019   AST 25 02/09/2019   ALKPHOS 78 02/09/2019   BILITOT 0.5 02/09/2019     Assessment: Cryptococcal pneumonitis.  Confirmed by BAL.  LP negative.  Started in [place].  Taking twice a day for tolerance.  Will need prolonged treatment, 6-12 months.     Plan: 1) Diflucan refilled 2) follow up in 3 months 3) ok from ID standpoint for continued prostate cancer treatments

## 2019-02-17 NOTE — Progress Notes (Signed)
Pt pref Onpro (received this as part of his previous tx at outside facility).  No PA required per referral notes.  MD approved Onpro in order to limit exposure to clinic during [place]. Kennith Center, Pharm.D., CPP 02/17/2019@12 :14 PM

## 2019-02-19 ENCOUNTER — Telehealth: Payer: Self-pay | Admitting: Medical Oncology

## 2019-02-19 NOTE — Telephone Encounter (Signed)
Spoke with Mr. Hisaw to introduce myself as the prostate nurse navigator and my role. I was unable to meet him the day he consulted with Dr. Alen Blew. He relocated to [place] from [place] to be with his daughter. He was diagnosed in 2017 with metastatic prostate cancer and started hormone therapy (Lupron) and Xtandi. After disease progression in May 2019, he started chemo (docetaxol). Dr. Alen Blew ordered labs and restaging scans that show progression of disease and has changed his treatment plan. He is scheduled  Jevtana 4/24 with Onpro. He and daughter-Gina received chemo education with Abigail Butts via telephone. He feels things are going well with his transition of care. I asked him to reach out to me for questions or concerns. He voiced understanding and thanked me for calling.

## 2019-02-19 NOTE — Telephone Encounter (Signed)
Left message with Barnett Applebaum (daughter) to make sure things are going well with getting her father's care established here at [facility]. I asked her to call me with questions or concerns.

## 2019-02-26 ENCOUNTER — Inpatient Hospital Stay: Payer: Medicare Other

## 2019-02-26 ENCOUNTER — Other Ambulatory Visit: Payer: Self-pay

## 2019-02-26 ENCOUNTER — Telehealth: Payer: Self-pay

## 2019-02-26 ENCOUNTER — Other Ambulatory Visit: Payer: Self-pay | Admitting: Oncology

## 2019-02-26 ENCOUNTER — Ambulatory Visit (HOSPITAL_COMMUNITY)
Admission: RE | Admit: 2019-02-26 | Discharge: 2019-02-26 | Disposition: A | Discharge status: Home / Self Care | Payer: Medicare Other | Source: Ambulatory Visit | Attending: Oncology | Admitting: Oncology

## 2019-02-26 VITALS — BP 150/82 | HR 86 | Temp 99.0°F | Resp 18

## 2019-02-26 DIAGNOSIS — C61 Malignant neoplasm of prostate: Secondary | ICD-10-CM | POA: Insufficient documentation

## 2019-02-26 DIAGNOSIS — Z95828 Presence of other vascular implants and grafts: Secondary | ICD-10-CM

## 2019-02-26 DIAGNOSIS — Z5111 Encounter for antineoplastic chemotherapy: Secondary | ICD-10-CM | POA: Diagnosis not present

## 2019-02-26 LAB — CBC WITH DIFFERENTIAL (CANCER CENTER ONLY)
Abs Immature Granulocytes: 0.04 10*3/uL (ref 0.00–0.07)
Basophils Absolute: 0.1 10*3/uL (ref 0.0–0.1)
Basophils Relative: 1 %
Eosinophils Absolute: 0.1 10*3/uL (ref 0.0–0.5)
Eosinophils Relative: 1 %
HCT: 36.8 % — ABNORMAL LOW (ref 39.0–52.0)
Hemoglobin: 11.2 g/dL — ABNORMAL LOW (ref 13.0–17.0)
Immature Granulocytes: 0 %
Lymphocytes Relative: 14 %
Lymphs Abs: 1.5 10*3/uL (ref 0.7–4.0)
MCH: 26.7 pg (ref 26.0–34.0)
MCHC: 30.4 g/dL (ref 30.0–36.0)
MCV: 87.6 fL (ref 80.0–100.0)
Monocytes Absolute: 0.9 10*3/uL (ref 0.1–1.0)
Monocytes Relative: 8 %
Neutro Abs: 8.3 10*3/uL — ABNORMAL HIGH (ref 1.7–7.7)
Neutrophils Relative %: 76 %
Platelet Count: 419 10*3/uL — ABNORMAL HIGH (ref 150–400)
RBC: 4.2 MIL/uL — ABNORMAL LOW (ref 4.22–5.81)
RDW: 19.7 % — ABNORMAL HIGH (ref 11.5–15.5)
WBC Count: 10.9 10*3/uL — ABNORMAL HIGH (ref 4.0–10.5)
nRBC: 0 % (ref 0.0–0.2)

## 2019-02-26 LAB — CMP (CANCER CENTER ONLY)
ALT: 17 U/L (ref 0–44)
AST: 22 U/L (ref 15–41)
Albumin: 2.8 g/dL — ABNORMAL LOW (ref 3.5–5.0)
Alkaline Phosphatase: 73 U/L (ref 38–126)
Anion gap: 14 (ref 5–15)
BUN: 13 mg/dL (ref 8–23)
CO2: 21 mmol/L — ABNORMAL LOW (ref 22–32)
Calcium: 9.1 mg/dL (ref 8.9–10.3)
Chloride: 102 mmol/L (ref 98–111)
Creatinine: 0.94 mg/dL (ref 0.61–1.24)
GFR, Est AFR Am: >60 mL/min (ref >60)
GFR, Estimated: >60 mL/min (ref >60)
Glucose, Bld: 247 mg/dL — ABNORMAL HIGH (ref 70–99)
Potassium: 4.7 mmol/L (ref 3.5–5.1)
Sodium: 137 mmol/L (ref 135–145)
Total Bilirubin: 0.3 mg/dL (ref 0.3–1.2)
Total Protein: 6.9 g/dL (ref 6.5–8.1)

## 2019-02-26 MED ORDER — DIPHENHYDRAMINE HCL 50 MG/ML IJ SOLN
INTRAMUSCULAR | Status: AC
  Filled 2019-02-26: qty 1

## 2019-02-26 MED ORDER — SODIUM CHLORIDE 0.9 % IV SOLN
Freq: Once | INTRAVENOUS | Status: AC
  Administered 2019-02-26: 13:00:00 via INTRAVENOUS
  Filled 2019-02-26: qty 250

## 2019-02-26 MED ORDER — FAMOTIDINE IN NACL 20-0.9 MG/50ML-% IV SOLN
INTRAVENOUS | Status: AC
  Filled 2019-02-26: qty 50

## 2019-02-26 MED ORDER — DEXTROSE 5 % IV SOLN
20.0000 mg/m2 | Freq: Once | INTRAVENOUS | Status: DC
  Filled 2019-02-26: qty 3.6

## 2019-02-26 MED ORDER — SODIUM CHLORIDE 0.9 % IV SOLN
20.0000 mg/m2 | Freq: Once | INTRAVENOUS | Status: AC
  Administered 2019-02-26: 15:00:00 36 mg via INTRAVENOUS
  Filled 2019-02-26: qty 3.6

## 2019-02-26 MED ORDER — PEGFILGRASTIM 6 MG/0.6ML ~~LOC~~ PSKT
6.0000 mg | PREFILLED_SYRINGE | Freq: Once | SUBCUTANEOUS | Status: AC
  Administered 2019-02-26: 16:00:00 6 mg via SUBCUTANEOUS

## 2019-02-26 MED ORDER — SODIUM CHLORIDE 0.9% FLUSH
10.0000 mL | INTRAVENOUS | Status: DC | PRN
  Administered 2019-02-26: 16:00:00 10 mL
  Filled 2019-02-26: qty 10

## 2019-02-26 MED ORDER — DEXAMETHASONE SODIUM PHOSPHATE 10 MG/ML IJ SOLN
INTRAMUSCULAR | Status: AC
  Filled 2019-02-26: qty 1

## 2019-02-26 MED ORDER — HEPARIN SOD (PORK) LOCK FLUSH 100 UNIT/ML IV SOLN
500.0000 [IU] | Freq: Once | INTRAVENOUS | Status: AC | PRN
  Administered 2019-02-26: 500 [IU]
  Filled 2019-02-26: qty 5

## 2019-02-26 MED ORDER — PEGFILGRASTIM 6 MG/0.6ML ~~LOC~~ PSKT
PREFILLED_SYRINGE | SUBCUTANEOUS | Status: AC
  Filled 2019-02-26: qty 0.6

## 2019-02-26 MED ORDER — DIPHENHYDRAMINE HCL 50 MG/ML IJ SOLN
25.0000 mg | Freq: Once | INTRAMUSCULAR | Status: AC
  Administered 2019-02-26: 13:00:00 25 mg via INTRAVENOUS

## 2019-02-26 MED ORDER — FAMOTIDINE IN NACL 20-0.9 MG/50ML-% IV SOLN
20.0000 mg | Freq: Once | INTRAVENOUS | Status: AC
  Administered 2019-02-26: 20 mg via INTRAVENOUS

## 2019-02-26 MED ORDER — SODIUM CHLORIDE 0.9% FLUSH
10.0000 mL | INTRAVENOUS | Status: DC | PRN
  Filled 2019-02-26: qty 10

## 2019-02-26 MED ORDER — DEXAMETHASONE SODIUM PHOSPHATE 10 MG/ML IJ SOLN
10.0000 mg | Freq: Once | INTRAMUSCULAR | Status: AC
  Administered 2019-02-26: 10 mg via INTRAVENOUS

## 2019-02-26 NOTE — Patient Instructions (Signed)
[facility] Discharge Instructions for Patients Receiving Chemotherapy  Today you received the following chemotherapy agents:  Jevtana ( cabazitaxel)  To help prevent nausea and vomiting after your treatment, we encourage you to take your nausea medication as prescribed    If you develop nausea and vomiting that is not controlled by your nausea medication, call the clinic.   BELOW ARE SYMPTOMS THAT SHOULD BE REPORTED IMMEDIATELY:  *FEVER GREATER THAN 100.5 F  *CHILLS WITH OR WITHOUT FEVER  NAUSEA AND VOMITING THAT IS NOT CONTROLLED WITH YOUR NAUSEA MEDICATION  *UNUSUAL SHORTNESS OF BREATH  *UNUSUAL BRUISING OR BLEEDING  TENDERNESS IN MOUTH AND THROAT WITH OR WITHOUT PRESENCE OF ULCERS  *URINARY PROBLEMS  *BOWEL PROBLEMS  UNUSUAL RASH Items with * indicate a potential emergency and should be followed up as soon as possible.  Feel free to call the clinic should you have any questions or concerns. The clinic phone number is [phone].  Please show the [facility] at check-in to the Emergency Department and triage nurse.  This information is directly available on the CDC website: RunningShows.co.za.html    Source:CDC Reference to specific commercial products, manufacturers, companies, or trademarks does not constitute its endorsement or recommendation by the [facility], [place], or Centers for [employer] and [employer].

## 2019-02-27 ENCOUNTER — Ambulatory Visit: Payer: Medicare Other

## 2019-02-27 LAB — PROSTATE-SPECIFIC AG, SERUM (LABCORP): Prostate Specific Ag, Serum: 19.9 ng/mL — ABNORMAL HIGH (ref 0.0–4.0)

## 2019-03-01 ENCOUNTER — Telehealth: Payer: Self-pay

## 2019-03-01 NOTE — Telephone Encounter (Signed)
Chemo follow-up call. Patient states he is doing fine and doesn't have any complaints. Patient instructed to call the office with any new questions or concerns. Patient verbalized understanding.

## 2019-03-02 ENCOUNTER — Telehealth: Payer: Self-pay

## 2019-03-02 NOTE — Telephone Encounter (Signed)
Received message from patient daughter Rosalie Doctor verbalizing concern regarding the patient's increasing weight loss and lethargy. She understands that this is a side effect of chemotherapy but wondering if there is anything else to do. Discussed supplement shakes like boost or ensure and she stated that the patient tried one but did not care for it. She also stated that he is without lower teeth right now and they are hoping to pick them up from his dentist in [place]. Explained that will make Dr. Alen Blew aware and request the dietician to follow up with any recommendations and education. Daughter requested that she be called and she can have the patient on speaker phone. Daughter had no other questions or concerns and message has been sent requesting dietician to follow up.

## 2019-03-03 ENCOUNTER — Ambulatory Visit: Payer: Medicare Other

## 2019-03-03 NOTE — Progress Notes (Signed)
Nutrition  Referral received from nurse for poor appetite, weight loss and daughter concerned.    Called daughter at 12:33 and left message with RD's call back number.  Abdulah Iqbal B. Zenia Resides, [place], [place] Registered Dietitian [phone] (pager)

## 2019-03-04 ENCOUNTER — Telehealth: Payer: Self-pay

## 2019-03-04 NOTE — Telephone Encounter (Signed)
Nutrition Assessment   Reason for Assessment:   Referral from nursing with poor appetite and weight loss, daughter concerned.    ASSESSMENT:  78 year old male with prostate cancer followed by Dr. Alen Blew.  Past medical history DM, crytococcus pneumonia, CAD.   Daughter returned RD's call this afternoon.  Patient was on speaker phone during call.  Daughter reports that appetite has been decreased and typically only eats 2 meals per day.  Typically cereal in the morning and an evening meal that daughter prepares.  Currently without bottom dentures right now so having difficulty chewing.  Has tried protein shake from [employer] and did not like it.  Reports some nausea. No constipation or diarrhea.     Nutrition Focused Physical Exam: deferred   Medications: lantus, compazine, predinsone   Labs: glucose 247   Anthropometrics:   Height: 68 inches Weight: 145 lb on 4/15 Dtr reports weight loss of 25 lb since the end of Jan 2020 BMI: 22  15% weight loss in the last 3 months, significant   Estimated Energy Needs  Kcals: 1980-2300 calories Protein: 99-115 g Fluid: > 1.9 L   NUTRITION DIAGNOSIS: Inadequate oral intake related to cancer and missing bottom teeth as evidenced by 15% weight loss in the last 3 months    INTERVENTION:  Discussed ways to increase calories and protein.  Will email handout to daughter. Discussed oral nutrition supplement shakes and recommend 350 calorie or higher shake. Would not recommend diabetic shake at this time due to lower in calories.  Recommend close monitoring of blood glucose and reach out to MD for medication adjustments vs restricting intake at this time.  Discussed soft moist protein foods and will email handout.   Will email high calorie, high protein recipes from [employer].   Contact information provided and daughter will reach out to RD if needed in the future.    MONITORING, EVALUATION, GOAL: patient will consume adequate calories and  protein to maintain weight   Next Visit: Daughter to contact as needed  Tierra Thoma B. Zenia Resides, [place], [place] Registered Dietitian [phone] (pager)

## 2019-03-18 ENCOUNTER — Other Ambulatory Visit: Payer: Self-pay

## 2019-03-18 DIAGNOSIS — C61 Malignant neoplasm of prostate: Secondary | ICD-10-CM

## 2019-03-19 ENCOUNTER — Inpatient Hospital Stay: Payer: Medicare Other

## 2019-03-19 ENCOUNTER — Inpatient Hospital Stay: Payer: Medicare Other | Attending: Oncology | Admitting: Oncology

## 2019-03-19 ENCOUNTER — Other Ambulatory Visit: Payer: Self-pay

## 2019-03-19 VITALS — BP 108/68 | HR 100 | Temp 98.2°F | Resp 18 | Ht 68.0 in | Wt 138.2 lb

## 2019-03-19 DIAGNOSIS — Z79899 Other long term (current) drug therapy: Secondary | ICD-10-CM

## 2019-03-19 DIAGNOSIS — C61 Malignant neoplasm of prostate: Secondary | ICD-10-CM

## 2019-03-19 DIAGNOSIS — Z7952 Long term (current) use of systemic steroids: Secondary | ICD-10-CM | POA: Diagnosis not present

## 2019-03-19 DIAGNOSIS — Z5189 Encounter for other specified aftercare: Secondary | ICD-10-CM | POA: Diagnosis not present

## 2019-03-19 DIAGNOSIS — Z95828 Presence of other vascular implants and grafts: Secondary | ICD-10-CM | POA: Insufficient documentation

## 2019-03-19 DIAGNOSIS — C7800 Secondary malignant neoplasm of unspecified lung: Secondary | ICD-10-CM | POA: Diagnosis not present

## 2019-03-19 DIAGNOSIS — Z5111 Encounter for antineoplastic chemotherapy: Secondary | ICD-10-CM | POA: Diagnosis present

## 2019-03-19 DIAGNOSIS — C7951 Secondary malignant neoplasm of bone: Secondary | ICD-10-CM

## 2019-03-19 DIAGNOSIS — R630 Anorexia: Secondary | ICD-10-CM | POA: Diagnosis not present

## 2019-03-19 DIAGNOSIS — Z86711 Personal history of pulmonary embolism: Secondary | ICD-10-CM

## 2019-03-19 DIAGNOSIS — B450 Pulmonary cryptococcosis: Secondary | ICD-10-CM | POA: Diagnosis not present

## 2019-03-19 LAB — CBC WITH DIFFERENTIAL (CANCER CENTER ONLY)
Abs Immature Granulocytes: 0.19 10*3/uL — ABNORMAL HIGH (ref 0.00–0.07)
Basophils Absolute: 0.1 10*3/uL (ref 0.0–0.1)
Basophils Relative: 1 %
Eosinophils Absolute: 0.1 10*3/uL (ref 0.0–0.5)
Eosinophils Relative: 0 %
HCT: 37.1 % — ABNORMAL LOW (ref 39.0–52.0)
Hemoglobin: 11.2 g/dL — ABNORMAL LOW (ref 13.0–17.0)
Immature Granulocytes: 1 %
Lymphocytes Relative: 20 %
Lymphs Abs: 2.8 10*3/uL (ref 0.7–4.0)
MCH: 26.7 pg (ref 26.0–34.0)
MCHC: 30.2 g/dL (ref 30.0–36.0)
MCV: 88.3 fL (ref 80.0–100.0)
Monocytes Absolute: 1.3 10*3/uL — ABNORMAL HIGH (ref 0.1–1.0)
Monocytes Relative: 9 %
Neutro Abs: 9.9 10*3/uL — ABNORMAL HIGH (ref 1.7–7.7)
Neutrophils Relative %: 69 %
Platelet Count: 367 10*3/uL (ref 150–400)
RBC: 4.2 MIL/uL — ABNORMAL LOW (ref 4.22–5.81)
RDW: 20.3 % — ABNORMAL HIGH (ref 11.5–15.5)
WBC Count: 14.3 10*3/uL — ABNORMAL HIGH (ref 4.0–10.5)
nRBC: 0.1 % (ref 0.0–0.2)

## 2019-03-19 LAB — CMP (CANCER CENTER ONLY)
ALT: 53 U/L — ABNORMAL HIGH (ref 0–44)
AST: 87 U/L — ABNORMAL HIGH (ref 15–41)
Albumin: 2.6 g/dL — ABNORMAL LOW (ref 3.5–5.0)
Alkaline Phosphatase: 90 U/L (ref 38–126)
Anion gap: 12 (ref 5–15)
BUN: 12 mg/dL (ref 8–23)
CO2: 23 mmol/L (ref 22–32)
Calcium: 8.8 mg/dL — ABNORMAL LOW (ref 8.9–10.3)
Chloride: 102 mmol/L (ref 98–111)
Creatinine: 0.94 mg/dL (ref 0.61–1.24)
GFR, Est AFR Am: >60 mL/min (ref >60)
GFR, Estimated: >60 mL/min (ref >60)
Glucose, Bld: 187 mg/dL — ABNORMAL HIGH (ref 70–99)
Potassium: 3.9 mmol/L (ref 3.5–5.1)
Sodium: 137 mmol/L (ref 135–145)
Total Bilirubin: 0.2 mg/dL — ABNORMAL LOW (ref 0.3–1.2)
Total Protein: 6 g/dL — ABNORMAL LOW (ref 6.5–8.1)

## 2019-03-19 MED ORDER — SODIUM CHLORIDE 0.9% FLUSH
10.0000 mL | Freq: Once | INTRAVENOUS | Status: AC
  Administered 2019-03-19: 09:00:00 10 mL
  Filled 2019-03-19: qty 10

## 2019-03-19 MED ORDER — FAMOTIDINE IN NACL 20-0.9 MG/50ML-% IV SOLN
20.0000 mg | Freq: Once | INTRAVENOUS | Status: AC
  Administered 2019-03-19: 20 mg via INTRAVENOUS

## 2019-03-19 MED ORDER — DIPHENHYDRAMINE HCL 50 MG/ML IJ SOLN
INTRAMUSCULAR | Status: AC
  Filled 2019-03-19: qty 1

## 2019-03-19 MED ORDER — DEXTROSE 5 % IV SOLN
20.0000 mg/m2 | Freq: Once | INTRAVENOUS | Status: DC
  Filled 2019-03-19: qty 3.6

## 2019-03-19 MED ORDER — DENOSUMAB 120 MG/1.7ML ~~LOC~~ SOLN
120.0000 mg | Freq: Once | SUBCUTANEOUS | Status: AC
  Administered 2019-03-19: 11:00:00 120 mg via SUBCUTANEOUS

## 2019-03-19 MED ORDER — FAMOTIDINE IN NACL 20-0.9 MG/50ML-% IV SOLN
INTRAVENOUS | Status: AC
  Filled 2019-03-19: qty 50

## 2019-03-19 MED ORDER — DEXAMETHASONE SODIUM PHOSPHATE 10 MG/ML IJ SOLN
10.0000 mg | Freq: Once | INTRAMUSCULAR | Status: AC
  Administered 2019-03-19: 10 mg via INTRAVENOUS

## 2019-03-19 MED ORDER — SODIUM CHLORIDE 0.9% FLUSH
10.0000 mL | INTRAVENOUS | Status: DC | PRN
  Administered 2019-03-19: 10 mL
  Filled 2019-03-19: qty 10

## 2019-03-19 MED ORDER — SODIUM CHLORIDE 0.9 % IV SOLN
20.0000 mg/m2 | Freq: Once | INTRAVENOUS | Status: AC
  Administered 2019-03-19: 36 mg via INTRAVENOUS
  Filled 2019-03-19: qty 3.6

## 2019-03-19 MED ORDER — PEGFILGRASTIM 6 MG/0.6ML ~~LOC~~ PSKT
PREFILLED_SYRINGE | SUBCUTANEOUS | Status: AC
  Filled 2019-03-19: qty 0.6

## 2019-03-19 MED ORDER — HEPARIN SOD (PORK) LOCK FLUSH 100 UNIT/ML IV SOLN
500.0000 [IU] | Freq: Once | INTRAVENOUS | Status: AC | PRN
  Administered 2019-03-19: 500 [IU]
  Filled 2019-03-19: qty 5

## 2019-03-19 MED ORDER — DIPHENHYDRAMINE HCL 50 MG/ML IJ SOLN
25.0000 mg | Freq: Once | INTRAMUSCULAR | Status: AC
  Administered 2019-03-19: 10:00:00 25 mg via INTRAVENOUS

## 2019-03-19 MED ORDER — DEXAMETHASONE SODIUM PHOSPHATE 10 MG/ML IJ SOLN
INTRAMUSCULAR | Status: AC
  Filled 2019-03-19: qty 1

## 2019-03-19 MED ORDER — PEGFILGRASTIM 6 MG/0.6ML ~~LOC~~ PSKT
6.0000 mg | PREFILLED_SYRINGE | Freq: Once | SUBCUTANEOUS | Status: AC
  Administered 2019-03-19: 6 mg via SUBCUTANEOUS

## 2019-03-19 MED ORDER — DENOSUMAB 120 MG/1.7ML ~~LOC~~ SOLN
SUBCUTANEOUS | Status: AC
  Filled 2019-03-19: qty 1.7

## 2019-03-19 MED ORDER — SODIUM CHLORIDE 0.9 % IV SOLN
Freq: Once | INTRAVENOUS | Status: AC
  Administered 2019-03-19: 10:00:00 via INTRAVENOUS
  Filled 2019-03-19: qty 250

## 2019-03-19 NOTE — Patient Instructions (Signed)
[facility] Discharge Instructions for Patients Receiving Chemotherapy  Today you received the following chemotherapy agents:  Jevtana ( cabazitaxel)  To help prevent nausea and vomiting after your treatment, we encourage you to take your nausea medication as prescribed    If you develop nausea and vomiting that is not controlled by your nausea medication, call the clinic.   BELOW ARE SYMPTOMS THAT SHOULD BE REPORTED IMMEDIATELY:  *FEVER GREATER THAN 100.5 F  *CHILLS WITH OR WITHOUT FEVER  NAUSEA AND VOMITING THAT IS NOT CONTROLLED WITH YOUR NAUSEA MEDICATION  *UNUSUAL SHORTNESS OF BREATH  *UNUSUAL BRUISING OR BLEEDING  TENDERNESS IN MOUTH AND THROAT WITH OR WITHOUT PRESENCE OF ULCERS  *URINARY PROBLEMS  *BOWEL PROBLEMS  UNUSUAL RASH Items with * indicate a potential emergency and should be followed up as soon as possible.  Feel free to call the clinic should you have any questions or concerns. The clinic phone number is [phone].  Please show the [facility] at check-in to the Emergency Department and triage nurse.  This information is directly available on the CDC website: RunningShows.co.za.html    Source:CDC Reference to specific commercial products, manufacturers, companies, or trademarks does not constitute its endorsement or recommendation by the [place], [place], or Centers for [employer] and [employer].

## 2019-03-19 NOTE — Progress Notes (Signed)
Per Dr. Alen Blew patient is OK to treat with today's labs

## 2019-03-19 NOTE — Progress Notes (Signed)
Hematology and Oncology Follow Up Visit  Michael Frey [id number] 1940-11-15 78 y.o. 03/19/2019 9:41 AM System, Pcp Not InNo ref. 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  He presented with a Gleason score 5+5 = 10 at that time.   Prior Therapy:  He was started on degarelix and Xgeva as well as Xtandi as a part of study while living in [place].    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  He was started on docetaxel 75 mg per metered square with growth factor support since May 2 of 2019.  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  Prednisone was held and subsequently restarted 5 mg twice a day.  He subsequently developed progression of disease with PSA up to 20.  Current therapy:   Jevtana 20 mg per metered square started April 24 of 2020.  He is currently here for cycle 2.  Lupron 30 mg every 4 months started on April 9 of 2020.  Xgeva 120 mg every 6 weeks started on February 11, 2019.   Interim History: Michael Frey presents today for a follow-up visit.  Since the last visit, he started Jevtana salvage chemotherapy without any major complications.  He did report some mild nausea and diarrhea as well as anorexia.  His symptoms is resolved at this time and feels better.  He is ambulating without any major difficulties and has not reported any falls or syncope.  Denies any worsening neuropathy or instability.  Patient denied any alteration mental status, neuropathy, confusion or dizziness.  Denies any headaches or lethargy.  Denies any night sweats, weight loss or changes in appetite.  Denied orthopnea, dyspnea on exertion or chest discomfort.  Denies shortness of breath, difficulty breathing hemoptysis or cough.  Denies any abdominal distention, nausea, early satiety or  dyspepsia.  Denies any hematuria, frequency, dysuria or nocturia.  Denies any skin irritation, dryness or rash.  Denies any ecchymosis or petechiae.  Denies any lymphadenopathy or clotting.  Denies any heat or cold intolerance.  Denies any anxiety or depression.  Remaining review of system is negative.        Medications: I have reviewed the patient's current medications.  Current Outpatient Medications  Medication Sig Dispense Refill  . acetaminophen (TYLENOL) 500 MG tablet Take 500 mg by mouth every 6 (six) hours as needed (2 tabs as needed for pain).    Marland Kitchen aspirin EC 81 MG tablet Take 81 mg by mouth at bedtime.    . busPIRone (BUSPAR) 7.5 MG tablet Take 7.5 mg by mouth 2 (two) times daily as needed (for anxiety).    . clopidogrel (PLAVIX) 75 MG tablet Take 75 mg by mouth daily.    . fluconazole (DIFLUCAN) 200 MG tablet Take 1 tablet (200 mg total) by mouth 2 times daily at 12 noon and 4 pm. For 30 days 60 tablet 11  . furosemide (LASIX) 20 MG tablet Take 20 mg by mouth daily.    . Insulin Glargine, 1 Unit Dial, (TOUJEO SOLOSTAR) 300 UNIT/ML SOPN Inject 15 Units into the skin 2 (two) times daily.    . isosorbide mononitrate (IMDUR) 30 MG 24 hr tablet Take 30 mg by mouth daily.    Marland Kitchen levothyroxine (SYNTHROID, LEVOTHROID) 25 MCG tablet Take 25 mcg by mouth daily before breakfast.    . lidocaine-prilocaine (EMLA)  cream Apply 1 application topically as needed. 30 g 0  . metFORMIN (GLUCOPHAGE) 1000 MG tablet Take 1,000 mg by mouth 2 (two) times daily with a meal.    . metoprolol succinate (TOPROL-XL) 25 MG 24 hr tablet Take 25 mg by mouth daily.    . nitroGLYCERIN (NITROSTAT) 0.4 MG SL tablet Place 0.4 mg under the tongue every 5 (five) minutes as needed for chest pain.    . predniSONE (DELTASONE) 5 MG tablet Take 5 mg by mouth 2 (two) times daily with a meal. At 6 am and noon    . prochlorperazine (COMPAZINE) 10 MG tablet Take 1 tablet (10 mg total) by mouth every 6 (six) hours as needed for  nausea or vomiting. (Patient not taking: Reported on 02/17/2019) 30 tablet 0  . rosuvastatin (CRESTOR) 10 MG tablet Take 10 mg by mouth daily.    . tamsulosin (FLOMAX) 0.4 MG CAPS capsule Take 0.4 mg by mouth daily after supper.    . traMADol (ULTRAM) 50 MG tablet Take 50 mg by mouth every 6 (six) hours as needed for moderate pain.     No current facility-administered medications for this visit.      Allergies:  Allergies  Allergen Reactions  . Naproxen     Past Medical History, Surgical history, Social history, and Family History were reviewed and updated.    Physical Exam: Blood pressure 108/68, pulse 100, temperature 98.2 F (36.8 C), temperature source Oral, resp. rate 18, height 5\' 8"  (1.727 m), weight 138 lb 3.2 oz (62.7 kg), SpO2 100 %. ECOG: 1 General appearance: alert and cooperative appeared without distress. Head: Normocephalic, without obvious abnormality Oropharynx: No oral thrush or ulcers. Eyes: No scleral icterus.  Pupils are equal and round reactive to light. Lymph nodes: Cervical, supraclavicular, and axillary nodes normal. Heart:regular rate and rhythm, S1, S2 normal, no murmur, click, rub or gallop Lung:chest clear, no wheezing, rales, normal symmetric air entry Abdomin: soft, non-tender, without masses or organomegaly. Neurological: No motor, sensory deficits.  Intact deep tendon reflexes. Skin: No rashes or lesions.  No ecchymosis or petechiae. Musculoskeletal: No joint deformity or effusion. Psychiatric: Mood and affect are appropriate.    Lab Results: Lab Results  Component Value Date   WBC 14.3 (H) 03/19/2019   HGB 11.2 (L) 03/19/2019   HCT 37.1 (L) 03/19/2019   MCV 88.3 03/19/2019   PLT 367 03/19/2019     Chemistry      Component Value Date/Time   NA 137 02/26/2019 1041   K 4.7 02/26/2019 1041   CL 102 02/26/2019 1041   CO2 21 (L) 02/26/2019 1041   BUN 13 02/26/2019 1041   CREATININE 0.94 02/26/2019 1041      Component Value Date/Time    CALCIUM 9.1 02/26/2019 1041   ALKPHOS 73 02/26/2019 1041   AST 22 02/26/2019 1041   ALT 17 02/26/2019 1041   BILITOT 0.3 02/26/2019 1041     Results for COLBE, VIVIANO (MRN [id number]) as of 03/19/2019 09:26  Ref. Range 02/09/2019 10:47 02/26/2019 10:41  Prostate Specific Ag, Serum Latest Ref Range: 0.0 - 4.0 ng/mL 15.4 (H) 19.9 (H)    Radiological Studies:  EXAM: CT CHEST, ABDOMEN, AND PELVIS WITH CONTRAST  TECHNIQUE: Multidetector CT imaging of the chest, abdomen and pelvis was performed following the standard protocol during bolus administration of intravenous contrast.  CONTRAST:  123mL OMNIPAQUE IOHEXOL 300 MG/ML SOLN, additional oral enteric contrast.  COMPARISON:  None.  FINDINGS: CT CHEST FINDINGS  Cardiovascular: Extensive 3 vessel  coronary artery calcification and stents. Status post median sternotomy and CABG. Normal heart size. No pericardial effusion. Right chest port catheter.  Mediastinum/Nodes: No enlarged mediastinal, hilar, or axillary lymph nodes. Thyroid gland, trachea, and esophagus demonstrate no significant findings.  Lungs/Pleura: There are numerous small pulmonary nodules, most conspicuous in the left upper lobe and superior segment right lower lobe, including some evidence of cavitation in the left upper lobe (series 6, image 52). No pleural effusion or pneumothorax.  Musculoskeletal: Innumerable sclerotic osseous lesions. Nonacute appearing fractures of the right sixth through ninth ribs.  CT ABDOMEN PELVIS FINDINGS  Hepatobiliary: No focal liver abnormality is seen. Status post cholecystectomy. No biliary dilatation.  Pancreas: Status post distal pancreatectomy. No pancreatic ductal dilatation or surrounding inflammatory changes.  Spleen: Status post splenectomy.  Adrenals/Urinary Tract: Adrenal glands are unremarkable. Kidneys are normal, without renal calculi, focal lesion, or hydronephrosis. Bladder is  unremarkable.  Stomach/Bowel: Stomach is within normal limits. Appendix appears normal. No evidence of bowel wall thickening, distention, or inflammatory changes. Large burden of stool in the colon.  Vascular/Lymphatic: Severe mixed calcific atherosclerosis. There are enlarged right perirectal and periprostatic lymph nodes (series 2, image 120, 117, series 4, image 87, 107)  Reproductive: No mass or other abnormality.  Prostatomegaly.  Other: Fat containing bilateral inguinal hernias. No abdominopelvic ascites.  Musculoskeletal: Innumerable sclerotic osseous lesions.  IMPRESSION: 1. There are numerous small pulmonary nodules, most conspicuous in the left upper lobe and superior segment right lower lobe, including some evidence of cavitation in the left upper lobe (series 6, image 52). Findings generally favor atypical infection, as pulmonary nodules are an uncommon manifestation of metastatic prostate malignancy. Comparison to prior imaging would be helpful to evaluate for stability or interval change.  2. Innumerable sclerotic osseous lesions, consistent with diffusely metastatic prostate malignancy.  3. Prostatomegaly with enlarged right perirectal and periprostatic lymph nodes (series 2, image 120, 117, series 4, image 87, 107). Comparison to prior imaging would be helpful to evaluate for stability or interval change.  4.  Status post distal pancreatectomy and splenectomy.  5.  Coronary artery disease and aortic atherosclerosis.  6. Nonacute appearing fractures of the right sixth through ninth ribs.    Impression and Plan:  78 year old man with the following:  1.    Advanced prostate cancer with disease to the bone that is currently castration-resistant diagnosed in 2017.  He is currently on Jevtana chemotherapy after progressing on therapy as outlined above.  He tolerated the first cycle with few complications but ready to proceed with cycle 2.  Risks  and benefits of continuing this therapy long-term was discussed.  These complications include nausea, mild suppression and diarrhea.  He is agreeable to proceed we will continue to monitor his PSA regularly.  2.  IV access: Port-A-Cath continues to be in use without any issues.  3.    Anorexia: We have discussed strategies to boost his appetite including using nutritional supplements as well as increasing high caloric diet.  4.  Androgen depravation: He is currently on Lupron which she will receive every 4 months.  His next injection will be in August 2020.  Long-term complications were reiterated which includes weight gain, hot flashes among others.  5.  Bone directed therapy: He is currently on Xgeva which he will receive every 6 weeks while on chemotherapy.  Complications including osteonecrosis of the jaw and hypocalcemia were reiterated.  6.  Cryptococcal pneumonia: He is currently on Diflucan and arranging follow-up with infectious disease.  7.  Goals of care and prognosis: Therapy remains palliative at this time although aggressive therapy is warranted given his reasonable performance status.  8.  Growth factor support: He will receive Neulasta onpro after each cycle of therapy.  He is high risk of neutropenia and growth factor support is needed.  9.  Follow-up: We will be in 3 weeks for the next cycle of therapy.   25  minutes was spent with the patient face-to-face today.  More than 50% of time was dedicated to reviewing his disease status, laboratory data as well as managing complications related to chemotherapy.       Zola Button, MD 5/15/20209:41 AM

## 2019-03-19 NOTE — Progress Notes (Signed)
Confirmed with Dr. Chapman Fitch dose today to be 20mg /m2 and not 25mg /m2.  Jalene Mullet, PharmD PGY2 Hematology/ Oncology Pharmacy Resident 03/19/2019 10:14 AM

## 2019-03-20 ENCOUNTER — Ambulatory Visit: Payer: Medicare Other

## 2019-03-20 LAB — PROSTATE-SPECIFIC AG, SERUM (LABCORP): Prostate Specific Ag, Serum: 18.9 ng/mL — ABNORMAL HIGH (ref 0.0–4.0)

## 2019-03-22 ENCOUNTER — Other Ambulatory Visit: Payer: Self-pay

## 2019-03-22 ENCOUNTER — Emergency Department (HOSPITAL_COMMUNITY): Payer: Medicare Other

## 2019-03-22 ENCOUNTER — Telehealth: Payer: Self-pay

## 2019-03-22 ENCOUNTER — Encounter (HOSPITAL_COMMUNITY): Payer: Self-pay

## 2019-03-22 ENCOUNTER — Telehealth: Payer: Self-pay | Admitting: Oncology

## 2019-03-22 ENCOUNTER — Inpatient Hospital Stay (HOSPITAL_COMMUNITY)
Admission: EM | Admit: 2019-03-22 | Discharge: 2019-03-24 | DRG: 176 | Disposition: A | Discharge status: Home / Self Care | Payer: Medicare Other | Attending: Internal Medicine | Admitting: Internal Medicine

## 2019-03-22 DIAGNOSIS — I2699 Other pulmonary embolism without acute cor pulmonale: Secondary | ICD-10-CM | POA: Diagnosis not present

## 2019-03-22 DIAGNOSIS — Z79899 Other long term (current) drug therapy: Secondary | ICD-10-CM

## 2019-03-22 DIAGNOSIS — E1165 Type 2 diabetes mellitus with hyperglycemia: Secondary | ICD-10-CM | POA: Diagnosis present

## 2019-03-22 DIAGNOSIS — E1142 Type 2 diabetes mellitus with diabetic polyneuropathy: Secondary | ICD-10-CM | POA: Diagnosis present

## 2019-03-22 DIAGNOSIS — Z9221 Personal history of antineoplastic chemotherapy: Secondary | ICD-10-CM

## 2019-03-22 DIAGNOSIS — I2510 Atherosclerotic heart disease of native coronary artery without angina pectoris: Secondary | ICD-10-CM | POA: Diagnosis present

## 2019-03-22 DIAGNOSIS — I10 Essential (primary) hypertension: Secondary | ICD-10-CM | POA: Diagnosis present

## 2019-03-22 DIAGNOSIS — E039 Hypothyroidism, unspecified: Secondary | ICD-10-CM | POA: Diagnosis present

## 2019-03-22 DIAGNOSIS — C61 Malignant neoplasm of prostate: Secondary | ICD-10-CM | POA: Diagnosis present

## 2019-03-22 DIAGNOSIS — Z7902 Long term (current) use of antithrombotics/antiplatelets: Secondary | ICD-10-CM

## 2019-03-22 DIAGNOSIS — C7801 Secondary malignant neoplasm of right lung: Secondary | ICD-10-CM | POA: Diagnosis present

## 2019-03-22 DIAGNOSIS — E860 Dehydration: Secondary | ICD-10-CM | POA: Diagnosis not present

## 2019-03-22 DIAGNOSIS — Z1159 Encounter for screening for other viral diseases: Secondary | ICD-10-CM

## 2019-03-22 DIAGNOSIS — E118 Type 2 diabetes mellitus with unspecified complications: Secondary | ICD-10-CM | POA: Diagnosis not present

## 2019-03-22 DIAGNOSIS — C7802 Secondary malignant neoplasm of left lung: Secondary | ICD-10-CM | POA: Diagnosis present

## 2019-03-22 DIAGNOSIS — Z9081 Acquired absence of spleen: Secondary | ICD-10-CM

## 2019-03-22 DIAGNOSIS — Z794 Long term (current) use of insulin: Secondary | ICD-10-CM

## 2019-03-22 DIAGNOSIS — R296 Repeated falls: Secondary | ICD-10-CM | POA: Diagnosis present

## 2019-03-22 DIAGNOSIS — E785 Hyperlipidemia, unspecified: Secondary | ICD-10-CM | POA: Diagnosis present

## 2019-03-22 DIAGNOSIS — Z951 Presence of aortocoronary bypass graft: Secondary | ICD-10-CM

## 2019-03-22 DIAGNOSIS — L8990 Pressure ulcer of unspecified site, unspecified stage: Secondary | ICD-10-CM

## 2019-03-22 DIAGNOSIS — Z7952 Long term (current) use of systemic steroids: Secondary | ICD-10-CM

## 2019-03-22 DIAGNOSIS — D72829 Elevated white blood cell count, unspecified: Secondary | ICD-10-CM | POA: Diagnosis present

## 2019-03-22 DIAGNOSIS — C7951 Secondary malignant neoplasm of bone: Secondary | ICD-10-CM | POA: Diagnosis present

## 2019-03-22 DIAGNOSIS — Z7982 Long term (current) use of aspirin: Secondary | ICD-10-CM

## 2019-03-22 HISTORY — DX: Type 2 diabetes mellitus without complications: E11.9

## 2019-03-22 HISTORY — DX: Malignant (primary) neoplasm, unspecified: C80.1

## 2019-03-22 LAB — BASIC METABOLIC PANEL
Anion gap: 16 — ABNORMAL HIGH (ref 5–15)
BUN: 25 mg/dL — ABNORMAL HIGH (ref 8–23)
CO2: 21 mmol/L — ABNORMAL LOW (ref 22–32)
Calcium: 8.9 mg/dL (ref 8.9–10.3)
Chloride: 99 mmol/L (ref 98–111)
Creatinine, Ser: 1.06 mg/dL (ref 0.61–1.24)
GFR calc Af Amer: >60 mL/min (ref >60)
GFR calc non Af Amer: >60 mL/min (ref >60)
Glucose, Bld: 238 mg/dL — ABNORMAL HIGH (ref 70–99)
Potassium: 4.2 mmol/L (ref 3.5–5.1)
Sodium: 136 mmol/L (ref 135–145)

## 2019-03-22 LAB — CBC WITH DIFFERENTIAL/PLATELET
Abs Immature Granulocytes: 4.2 10*3/uL — ABNORMAL HIGH (ref 0.00–0.07)
Basophils Absolute: 0 10*3/uL (ref 0.0–0.1)
Basophils Relative: 0 %
Eosinophils Absolute: 0.1 10*3/uL (ref 0.0–0.5)
Eosinophils Relative: 0 %
HCT: 34.9 % — ABNORMAL LOW (ref 39.0–52.0)
Hemoglobin: 10.9 g/dL — ABNORMAL LOW (ref 13.0–17.0)
Immature Granulocytes: 9 %
Lymphocytes Relative: 4 %
Lymphs Abs: 1.8 10*3/uL (ref 0.7–4.0)
MCH: 26.8 pg (ref 26.0–34.0)
MCHC: 31.2 g/dL (ref 30.0–36.0)
MCV: 86 fL (ref 80.0–100.0)
Monocytes Absolute: 0.2 10*3/uL (ref 0.1–1.0)
Monocytes Relative: 1 %
Neutro Abs: 42.6 10*3/uL — ABNORMAL HIGH (ref 1.7–7.7)
Neutrophils Relative %: 86 %
Platelets: 230 10*3/uL (ref 150–400)
RBC: 4.06 MIL/uL — ABNORMAL LOW (ref 4.22–5.81)
RDW: 20.2 % — ABNORMAL HIGH (ref 11.5–15.5)
WBC: 48.9 10*3/uL — ABNORMAL HIGH (ref 4.0–10.5)
nRBC: 0 % (ref 0.0–0.2)

## 2019-03-22 LAB — GLUCOSE, CAPILLARY: Glucose-Capillary: 153 mg/dL — ABNORMAL HIGH (ref 70–99)

## 2019-03-22 LAB — D-DIMER, QUANTITATIVE: D-Dimer, Quant: 7.41 ug/mL-FEU — ABNORMAL HIGH (ref 0.00–0.50)

## 2019-03-22 LAB — TROPONIN I
Troponin I: 0.03 ng/mL (ref <0.03)
Troponin I: <0.03 ng/mL (ref <0.03)

## 2019-03-22 LAB — CBC
HCT: 37.4 % — ABNORMAL LOW (ref 39.0–52.0)
Hemoglobin: 11.6 g/dL — ABNORMAL LOW (ref 13.0–17.0)
MCH: 27.2 pg (ref 26.0–34.0)
MCHC: 31 g/dL (ref 30.0–36.0)
MCV: 87.8 fL (ref 80.0–100.0)
Platelets: 284 10*3/uL (ref 150–400)
RBC: 4.26 MIL/uL (ref 4.22–5.81)
RDW: 20.6 % — ABNORMAL HIGH (ref 11.5–15.5)
WBC: 53.6 10*3/uL (ref 4.0–10.5)
nRBC: 0 % (ref 0.0–0.2)

## 2019-03-22 LAB — BRAIN NATRIURETIC PEPTIDE: B Natriuretic Peptide: 126.8 pg/mL — ABNORMAL HIGH (ref 0.0–100.0)

## 2019-03-22 LAB — CBG MONITORING, ED: Glucose-Capillary: 177 mg/dL — ABNORMAL HIGH (ref 70–99)

## 2019-03-22 LAB — SARS CORONAVIRUS 2 BY RT PCR (HOSPITAL ORDER, PERFORMED IN ~~LOC~~ HOSPITAL LAB): SARS Coronavirus 2: NEGATIVE

## 2019-03-22 MED ORDER — ROSUVASTATIN CALCIUM 10 MG PO TABS
10.0000 mg | ORAL_TABLET | Freq: Every day | ORAL | Status: DC
  Administered 2019-03-23 – 2019-03-24 (×2): 10 mg via ORAL
  Filled 2019-03-22 (×2): qty 1

## 2019-03-22 MED ORDER — SODIUM CHLORIDE 0.9% FLUSH
10.0000 mL | Freq: Two times a day (BID) | INTRAVENOUS | Status: DC
  Administered 2019-03-22 – 2019-03-24 (×4): 10 mL

## 2019-03-22 MED ORDER — ONDANSETRON HCL 4 MG/2ML IJ SOLN: 4.0000 mg | Freq: Four times a day (QID) | INTRAMUSCULAR | Status: DC | PRN

## 2019-03-22 MED ORDER — SODIUM CHLORIDE (PF) 0.9 % IJ SOLN
INTRAMUSCULAR | Status: AC
  Filled 2019-03-22: qty 50

## 2019-03-22 MED ORDER — SODIUM CHLORIDE 0.9 % IV BOLUS
500.0000 mL | Freq: Once | INTRAVENOUS | Status: AC
  Administered 2019-03-22: 500 mL via INTRAVENOUS

## 2019-03-22 MED ORDER — ACETAMINOPHEN 325 MG PO TABS: 650.0000 mg | ORAL_TABLET | Freq: Four times a day (QID) | ORAL | Status: DC | PRN

## 2019-03-22 MED ORDER — HEPARIN (PORCINE) 25000 UT/250ML-% IV SOLN
1100.0000 [IU]/h | INTRAVENOUS | Status: DC
  Administered 2019-03-22: 20:00:00 1100 [IU]/h via INTRAVENOUS
  Filled 2019-03-22 (×2): qty 250

## 2019-03-22 MED ORDER — PREDNISONE 5 MG PO TABS
5.0000 mg | ORAL_TABLET | Freq: Two times a day (BID) | ORAL | Status: DC
  Administered 2019-03-23 – 2019-03-24 (×3): 5 mg via ORAL
  Filled 2019-03-22 (×3): qty 1

## 2019-03-22 MED ORDER — ORAL CARE MOUTH RINSE
15.0000 mL | Freq: Two times a day (BID) | OROMUCOSAL | Status: DC
  Administered 2019-03-22 – 2019-03-24 (×4): 15 mL via OROMUCOSAL

## 2019-03-22 MED ORDER — IOHEXOL 350 MG/ML SOLN
100.0000 mL | Freq: Once | INTRAVENOUS | Status: AC | PRN
  Administered 2019-03-22: 50 mL via INTRAVENOUS

## 2019-03-22 MED ORDER — CLOPIDOGREL BISULFATE 75 MG PO TABS
75.0000 mg | ORAL_TABLET | Freq: Every day | ORAL | Status: DC
  Administered 2019-03-23: 75 mg via ORAL
  Filled 2019-03-22 (×2): qty 1

## 2019-03-22 MED ORDER — INSULIN GLARGINE 100 UNIT/ML ~~LOC~~ SOLN
15.0000 [IU] | Freq: Two times a day (BID) | SUBCUTANEOUS | Status: DC
  Administered 2019-03-22: 23:00:00 15 [IU] via SUBCUTANEOUS
  Filled 2019-03-22 (×2): qty 0.15

## 2019-03-22 MED ORDER — SODIUM CHLORIDE 0.9 % IV SOLN
INTRAVENOUS | Status: AC
  Administered 2019-03-22: 23:00:00 via INTRAVENOUS

## 2019-03-22 MED ORDER — HYDROCODONE-ACETAMINOPHEN 5-325 MG PO TABS: 1.0000 | ORAL_TABLET | ORAL | Status: DC | PRN

## 2019-03-22 MED ORDER — ONDANSETRON HCL 4 MG PO TABS: 4.0000 mg | ORAL_TABLET | Freq: Four times a day (QID) | ORAL | Status: DC | PRN

## 2019-03-22 MED ORDER — HEPARIN BOLUS VIA INFUSION
2000.0000 [IU] | Freq: Once | INTRAVENOUS | Status: AC
  Administered 2019-03-22: 2000 [IU] via INTRAVENOUS
  Filled 2019-03-22: qty 2000

## 2019-03-22 MED ORDER — FLUCONAZOLE 100 MG PO TABS
200.0000 mg | ORAL_TABLET | Freq: Two times a day (BID) | ORAL | Status: DC
  Administered 2019-03-23 (×2): 200 mg via ORAL
  Filled 2019-03-22 (×2): qty 2

## 2019-03-22 MED ORDER — INSULIN ASPART 100 UNIT/ML ~~LOC~~ SOLN
0.0000 [IU] | SUBCUTANEOUS | Status: DC
  Administered 2019-03-23: 3 [IU] via SUBCUTANEOUS

## 2019-03-22 MED ORDER — ACETAMINOPHEN 650 MG RE SUPP: 650.0000 mg | Freq: Four times a day (QID) | RECTAL | Status: DC | PRN

## 2019-03-22 MED ORDER — SODIUM CHLORIDE 0.9% FLUSH: 3.0000 mL | Freq: Once | INTRAVENOUS | Status: DC

## 2019-03-22 MED ORDER — SODIUM CHLORIDE 0.9% FLUSH: 10.0000 mL | INTRAVENOUS | Status: DC | PRN

## 2019-03-22 MED ORDER — CHLORHEXIDINE GLUCONATE CLOTH 2 % EX PADS
6.0000 | MEDICATED_PAD | Freq: Every day | CUTANEOUS | Status: DC
  Administered 2019-03-23 – 2019-03-24 (×2): 6 via TOPICAL

## 2019-03-22 NOTE — ED Notes (Signed)
ED TO INPATIENT HANDOFF REPORT  ED Nurse Name and Phone #: [phone] Myrtie Soman Name/Age/Gender Michael Frey 78 y.o. male Room/Bed: WA23/WA23  Code Status   Code Status: Not on file  Home/SNF/Other Home Patient oriented to: self, place, time and situation Is this baseline? Yes   Triage Complete: Triage complete  Chief Complaint Chest Pain (Cancer pt)  Triage Note Patient c/o left chest pain and SOB since 1300 today. patient also c/o hyperglycemia and dizziness x 2-3 days.   Allergies Allergies  Allergen Reactions  . Naproxen     Level of Care/Admitting Diagnosis ED Disposition    ED Disposition Condition [facility] Area: [place] [[id number]]  Level of Care: Stepdown [14]  Admit to SDU based on following criteria: Hemodynamic compromise or significant risk of instability:  Patient requiring short term acute titration and management of vasoactive drips, and invasive monitoring (i.e., CVP and Arterial line).  Covid Evaluation: N/A  Diagnosis: Pulmonary embolus [facility]) [[id number]]  Admitting Physician: Toy Baker [[id number]]  Attending Physician: Toy Baker [[id number]]  PT Class (Do Not Modify): Observation [104]  PT Acc Code (Do Not Modify): Observation [10022]       B Medical/Surgery History Past Medical History:  Diagnosis Date  . Cancer ([place])   . Diabetes mellitus without complication [facility])    Past Surgical History:  Procedure Laterality Date  . CHOLECYSTECTOMY    . paratial pancrectomy    . PORTACATH PLACEMENT    . SPLENECTOMY       A IV Location/Drains/Wounds Patient Lines/Drains/Airways Status   Active Line/Drains/Airways    Name:   Placement date:   Placement time:   Site:   Days:   Implanted Port Right Chest   --    --    Chest             Intake/Output Last 24 hours No intake or output data in the 24 hours ending 03/22/19 2122  Labs/Imaging Results for orders placed or performed during the  hospital encounter of 03/22/19 (from the past 48 hour(s))  CBG monitoring, ED     Status: Abnormal   Collection Time: 03/22/19  3:52 PM  Result Value Ref Range   Glucose-Capillary 177 (H) 70 - 99 mg/dL  Basic metabolic panel     Status: Abnormal   Collection Time: 03/22/19  3:59 PM  Result Value Ref Range   Sodium 136 135 - 145 mmol/L   Potassium 4.2 3.5 - 5.1 mmol/L   Chloride 99 98 - 111 mmol/L   CO2 21 (L) 22 - 32 mmol/L   Glucose, Bld 238 (H) 70 - 99 mg/dL   BUN 25 (H) 8 - 23 mg/dL   Creatinine, Ser 1.06 0.61 - 1.24 mg/dL   Calcium 8.9 8.9 - 10.3 mg/dL   GFR calc non Af Amer >60 >60 mL/min   GFR calc Af Amer >60 >60 mL/min   Anion gap 16 (H) 5 - 15    Comment: Performed at [facility], [place] [address], [place], [place] [address]  CBC     Status: Abnormal   Collection Time: 03/22/19  3:59 PM  Result Value Ref Range   WBC 53.6 (HH) 4.0 - 10.5 K/uL    Comment: This critical result has verified and been called to [facility]. RN by Sarita Bottom on 05 18 2020 at 1634, and has been read back. CRITICAL RESULT VERIFIED   RBC 4.26 4.22 - 5.81 MIL/uL   Hemoglobin  11.6 (L) 13.0 - 17.0 g/dL   HCT 37.4 (L) 39.0 - 52.0 %   MCV 87.8 80.0 - 100.0 fL   MCH 27.2 26.0 - 34.0 pg   MCHC 31.0 30.0 - 36.0 g/dL   RDW 20.6 (H) 11.5 - 15.5 %   Platelets 284 150 - 400 K/uL   nRBC 0.0 0.0 - 0.2 %    Comment: Performed at [facility], [place] [address] [place], [place] [address]  Troponin I - ONCE - STAT     Status: Abnormal   Collection Time: 03/22/19  3:59 PM  Result Value Ref Range   Troponin I 0.03 (HH) <0.03 ng/mL    Comment: CRITICAL RESULT CALLED TO, READ BACK BY AND VERIFIED WITH: BEIGUM,S RN @1651  ON 03/22/2019 JACKSON,K Performed at [facility], [place] [address], [place], [place] [address]   Brain natriuretic peptide     Status: Abnormal   Collection Time: 03/22/19  3:59 PM  Result Value Ref Range   B Natriuretic Peptide 126.8 (H)  0.0 - 100.0 pg/mL    Comment: Performed at [facility], [place] [address] [place], [place] [address]  D-dimer, quantitative (not at [facility])     Status: Abnormal   Collection Time: 03/22/19  3:59 PM  Result Value Ref Range   D-Dimer, Quant 7.41 (H) 0.00 - 0.50 ug/mL-FEU    Comment: (NOTE) At the manufacturer cut-off of 0.50 ug/mL FEU, this assay has been documented to exclude PE with a sensitivity and negative predictive value of 97 to 99%.  At this time, this assay has not been approved by the FDA to exclude DVT/VTE. Results should be correlated with clinical presentation. Performed at [facility], [place] [address], [place], [place] [address]   SARS Coronavirus 2 (CEPHEID - Performed in [place] hospital lab), Hosp Order     Status: None   Collection Time: 03/22/19  4:40 PM  Result Value Ref Range   SARS Coronavirus 2 NEGATIVE NEGATIVE    Comment: (NOTE) If result is NEGATIVE SARS-CoV-2 target nucleic acids are NOT DETECTED. The SARS-CoV-2 RNA is generally detectable in upper and lower  respiratory specimens during the acute phase of infection. The lowest  concentration of SARS-CoV-2 viral copies this assay can detect is 250  copies / mL. A negative result does not preclude SARS-CoV-2 infection  and should not be used as the sole basis for treatment or other  patient management decisions.  A negative result may occur with  improper specimen collection / handling, submission of specimen other  than nasopharyngeal swab, presence of viral mutation(s) within the  areas targeted by this assay, and inadequate number of viral copies  (<250 copies / mL). A negative result must be combined with clinical  observations, patient history, and epidemiological information. If result is POSITIVE SARS-CoV-2 target nucleic acids are DETECTED. The SARS-CoV-2 RNA is generally detectable in upper and lower  respiratory specimens dur ing the acute phase of  infection.  Positive  results are indicative of active infection with SARS-CoV-2.  Clinical  correlation with patient history and other diagnostic information is  necessary to determine patient infection status.  Positive results do  not rule out bacterial infection or co-infection with other viruses. If result is PRESUMPTIVE POSTIVE SARS-CoV-2 nucleic acids MAY BE PRESENT.   A presumptive positive result was obtained on the submitted specimen  and confirmed on repeat testing.  While 2019 novel coronavirus  (SARS-CoV-2) nucleic acids may be present  in the submitted sample  additional confirmatory testing may be necessary for epidemiological  and / or clinical management purposes  to differentiate between  SARS-CoV-2 and other Sarbecovirus currently known to infect humans.  If clinically indicated additional testing with an alternate test  methodology [phone]) is advised. The SARS-CoV-2 RNA is generally  detectable in upper and lower respiratory sp ecimens during the acute  phase of infection. The expected result is Negative. Fact Sheet for Patients:  StrictlyIdeas.no Fact Sheet for Healthcare Providers: BankingDealers.co.za This test is not yet approved or cleared by the [place] FDA and has been authorized for detection and/or diagnosis of SARS-CoV-2 by FDA under an Emergency Use Authorization (EUA).  This EUA will remain in effect (meaning this test can be used) for the duration of the COVID-19 declaration under Section 564(b)(1) of the Act, 21 U.S.C. section 360bbb-3(b)(1), unless the authorization is terminated or revoked sooner. Performed at [facility], [place] [address] [place], [place] [address]    Dg Chest 2 View  Result Date: 03/22/2019 CLINICAL DATA:  77 year old male with shortness of breath and fatigue EXAM: CHEST - 2 VIEW COMPARISON:  Chest x-ray 02/26/2019, CT 02/09/2019 FINDINGS: Cardiomediastinal  silhouette unchanged in size and contour. Surgical changes of median sternotomy and CABG. Right IJ port catheter is unchanged. Sclerotic foci of the visualized appendicular and axial skeleton, similar to the comparison CT and plain film. No pneumothorax or pleural effusion.  No confluent airspace disease. Lung nodules are better characterized on recent CT imaging, not visualized on the current study. IMPRESSION: Negative for acute cardiopulmonary disease. Redemonstration of sclerotic metastases of the visualized axial and appendicular skeleton. Right IJ port catheter. Surgical changes of median sternotomy and CABG. Electronically Signed   By: Corrie Mckusick D.O.   On: 03/22/2019 16:11   Ct Angio Chest Pe W And/or Wo Contrast  Result Date: 03/22/2019 CLINICAL DATA:  LEFT chest pain and shortness of breath since 1300 hours. Hyperglycemia and dizziness. EXAM: CT ANGIOGRAPHY CHEST WITH CONTRAST TECHNIQUE: Multidetector CT imaging of the chest was performed using the standard protocol during bolus administration of intravenous contrast. Multiplanar CT image reconstructions and MIPs were obtained to evaluate the vascular anatomy. CONTRAST:  61mL OMNIPAQUE IOHEXOL 350 MG/ML SOLN COMPARISON:  Chest x-ray earlier today. CT chest 02/09/2019 FINDINGS: Cardiovascular: Satisfactory opacification of the pulmonary arteries to the segmental level. BILATERAL pulmonary lower lobe emboli are present, more extensive on the RIGHT. No signs of RIGHT heart strain. Extensive three-vessel coronary artery calcification. Median sternotomy for CABG. RIGHT chest port catheter. Aortic atherosclerosis. Normal heart size. No pericardial effusion. Mediastinum/Nodes: No enlarged mediastinal, hilar, or axillary lymph nodes. Thyroid gland, trachea, and esophagus demonstrate no significant findings. Lungs/Pleura: Redemonstrated are numerous small pulmonary nodules, most conspicuous in the LEFT upper lobe and superior segment RIGHT lower lobe, with  some cavitation in the LEFT upper lobe. No pleural effusion or pneumothorax. These appear slightly improved compared with priors. Upper Abdomen: No acute abnormality. Musculoskeletal: Innumerable sclerotic osseous lesions, nonacute appearing RIGHT-sided rib fractures. Review of the MIP images confirms the above findings. IMPRESSION: 1. BILATERAL pulmonary emboli, greater on the RIGHT. 2. Widespread osseous metastatic disease, secondary to prostate cancer. 3. BILATERAL pulmonary nodules, appears slightly improved compared with priors. Atypical infection is favored. 4. These results were called by telephone at the time of interpretation on 03/22/2019 at 7:05 pm to Dr. Dorie Rank , who verbally acknowledged these results. Aortic Atherosclerosis (ICD10-I70.0). Electronically Signed   By: Roderic Ovens.D.  On: 03/22/2019 19:10    Pending Labs Unresulted Labs (From admission, onward)    Start     Ordered   03/23/19 0400  Heparin level (unfractionated)  Once-Timed,   R     03/22/19 2033   03/22/19 1559  Pathologist smear review  Once,   R     03/22/19 1559          Vitals/Pain Today's Vitals   03/22/19 1915 03/22/19 1930 03/22/19 1945 03/22/19 2000  BP:  127/79  134/69  Pulse: 99 95 92 94  Resp: 17 18 16 17   Temp:      TempSrc:      SpO2: 96% 98% 93% 97%  Weight:      Height:      PainSc:        Isolation Precautions No active isolations  Medications Medications  sodium chloride flush (NS) 0.9 % injection 3 mL (3 mLs Intravenous Not Given 03/22/19 1704)  sodium chloride (PF) 0.9 % injection (has no administration in time range)  heparin bolus via infusion 2,000 Units (2,000 Units Intravenous Bolus from Bag 03/22/19 2009)    Followed by  heparin ADULT infusion 100 units/mL (25000 units/214mL sodium chloride 0.45%) (1,100 Units/hr Intravenous New Bag/Given 03/22/19 2008)  sodium chloride 0.9 % bolus 500 mL (has no administration in time range)  iohexol (OMNIPAQUE) 350 MG/ML injection 100  mL (50 mLs Intravenous Contrast Given 03/22/19 1810)    Mobility walks with person assist Low fall risk   Focused Assessments Pulmonary Assessment Handoff:  Lung sounds:   O2 Device: Room Air        R Recommendations: See Admitting Provider Note  Report given to:   Additional Notes: Patient is a cancer patient.

## 2019-03-22 NOTE — Telephone Encounter (Signed)
Patient's daughter Michael Frey called and stated patient is dizzy, weak, and has no appetite. States blood sugars running in the 400's and last BP was 73/45. Sandi Mealy, PA made aware and recommends patient proceed to the Emergency Department for evaluation. Michael Frey made aware and verbalized understanding. States she will bring her father to [facility] ED.

## 2019-03-22 NOTE — Progress Notes (Signed)
ANTICOAGULATION CONSULT NOTE - Initial Consult  Pharmacy Consult for Heparin Indication: pulmonary embolus  Allergies  Allergen Reactions  . Naproxen     Patient Measurements: Height: 5\' 8"  (172.7 cm) Weight: 138 lb (62.6 kg) IBW/kg (Calculated) : 68.4  Vital Signs: Temp: 97.6 F (36.4 C) (05/18 1541) Temp Source: Oral (05/18 1541) BP: 126/75 (05/18 1908) Pulse Rate: 94 (05/18 1908)  Labs: Recent Labs    03/22/19 1559  HGB 11.6*  HCT 37.4*  PLT 284  CREATININE 1.06  TROPONINI 0.03*    Estimated Creatinine Clearance: 49.2 mL/min (by C-G formula based on SCr of 1.06 mg/dL).   Medical History: Past Medical History:  Diagnosis Date  . Cancer ([place])   . Diabetes mellitus without complication ([facility])     Medications:  Infusions:   No anticoagulants PTA  Assessment: 78 yo M with hx metastatic prostate cancer currently undergoing palliative chemotherapy & treatment for cryptococcal PNA who presents with chest pain.  Chest CT + new bilateral PE.  No bleeding noted.  Hg slightly low but at baseline, pltc WNL.   Last chemo 5/15- WBC elevated post- Neulasta.  Goal of Therapy:  Heparin level 0.3-0.7 units/ml Monitor platelets by anticoagulation protocol: Yes   Plan:  Give heparin 2000 units IV bolus x1 Start heparin infusion at 1100 units/hr Check heparin level 8h after infusion started Daily heparin level & CBC while on heparin  Biagio Borg 03/22/2019,7:20 PM

## 2019-03-22 NOTE — Telephone Encounter (Signed)
-----   Message from Wyatt Portela, MD sent at 03/22/2019  8:14 AM EDT ----- Please let him know his PSA is slightly down.

## 2019-03-22 NOTE — H&P (Signed)
Michael Frey OAC:[id number] DOB: 08/21/1941 DOA: 03/22/2019     PCP: System, Pcp Not In   Outpatient Specialists:  CARDS:   In Michael Frey    Oncology   Dr. Alen Frey   ID Michael Frey  Patient arrived to ER on 03/22/19 at 1526  Patient coming from: home Lives With family daughter    Chief Complaint:  Chief Complaint  Patient presents with  . Chest Pain  . Hyperglycemia  . Dizziness  . cancer patient    HPI: Michael Frey is a 78 y.o. 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 Reports feeling short of breath worse with exertion even getting dressed. His family noted his blood pressure 73/45, reports palpitations, Occasional chest pain  Family called oncology and he was sent to [facility] ER Also noted his BG was running high in 400's  Recent falls have been doing for relatively well he has chronic neuropathy and states his legs chronically swell a bit he have not had any chest pain no pleuritic chest pain.  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 Critical Care  . SARS Coronavirus 2 (CEPHEID - Performed in [place] hospital lab), [facility]  . DG Chest 2 View  . CT Angio Chest PE W and/or Wo Contrast  . Basic metabolic panel  . CBC  . Troponin I - ONCE - STAT  . Brain natriuretic peptide  . D-dimer, quantitative (not at [facility]  . Pathologist smear review  . Heparin level (unfractionated)  . Cardiac monitoring  . Saline Lock IV, Maintain IV access  . Cardiac monitoring  . heparin per pharmacy consult  . Consult to hospitalist  . Pulse oximetry, continuous  . CBG monitoring, ED  . ED EKG within 10 minutes  . Place in observation (patient's expected length of stay will be less than 2 midnights)     Following Medications were ordered in ER: Medications  sodium chloride flush (NS) 0.9 % injection 3 mL (3 mLs Intravenous Not Given 03/22/19 1704)  sodium chloride (PF) 0.9 % injection (has no administration in time range)  heparin bolus via infusion 2,000 Units (2,000 Units Intravenous Bolus from Bag 03/22/19 2009)    Followed by  heparin ADULT infusion 100  units/mL (25000 units/259mL sodium chloride 0.45%) (1,100 Units/hr Intravenous New Bag/Given 03/22/19 2008)  sodium chloride 0.9 % bolus 500 mL (has no administration in time range)  iohexol (OMNIPAQUE) 350 MG/ML injection 100 mL (50 mLs Intravenous Contrast Given 03/22/19 1810)        Consult Orders  (From admission, onward)         Start     Ordered   03/22/19 1944  Consult to hospitalist  Once    Provider:  Toy Baker, MD  Question Answer Comment  Place call to: [facility]   Reason for Consult Admit      03/22/19 1943             Significant initial  Findings: Abnormal Labs Reviewed  BASIC METABOLIC PANEL - Abnormal; Notable for the following components:      Result Value   CO2 21 (*)    Glucose, Bld 238 (*)    BUN 25 (*)    Anion gap 16 (*)    All other  components within normal limits  CBC - Abnormal; Notable for the following components:   WBC 53.6 (*)    Hemoglobin 11.6 (*)    HCT 37.4 (*)    RDW 20.6 (*)    All other components within normal limits  TROPONIN I - Abnormal; Notable for the following components:   Troponin I 0.03 (*)    All other components within normal limits  BRAIN NATRIURETIC PEPTIDE - Abnormal; Notable for the following components:   B Natriuretic Peptide 126.8 (*)    All other components within normal limits  D-DIMER, QUANTITATIVE (NOT AT [facility]) - Abnormal; Notable for the following components:   D-Dimer, Quant 7.41 (*)    All other components within normal limits  CBG MONITORING, ED - Abnormal; Notable for the following components:   Glucose-Capillary 177 (*)    All other components within normal limits    Otherwise labs showing:    Recent Labs  Lab 03/19/19 0912 03/22/19 1559  NA 137 136  K 3.9 4.2  CO2 23 21*  GLUCOSE 187* 238*  BUN 12 25*  CREATININE 0.94 1.06  CALCIUM 8.8* 8.9    Cr  Stable,  Lab Results  Component Value Date   CREATININE 1.06 03/22/2019   CREATININE 0.94 03/19/2019   CREATININE 0.94 02/26/2019    Recent Labs  Lab 03/19/19 0912  AST 87*  ALT 53*  ALKPHOS 90  BILITOT 0.2*  PROT 6.0*  ALBUMIN 2.6*   Lab Results  Component Value Date   CALCIUM 8.9 03/22/2019     WBC      Component Value Date/Time   WBC 53.6 (HH) 03/22/2019 1559   ANC    Component Value Date/Time   NEUTROABS 9.9 (H) 03/19/2019 0912    ALC   Plt: Lab Results  Component Value Date   PLT 284 03/22/2019     Lab Results  Component Value Date   DDIMER 7.41 (H) 03/22/2019    No results found for: FERRITIN      HG/HCT  stable,      Component Value Date/Time   HGB 11.6 (L) 03/22/2019 1559   HGB 11.2 (L) 03/19/2019 0912   HCT 37.4 (L) 03/22/2019 1559      Troponin   Cardiac Panel (last 3 results) Recent Labs    03/22/19 1559  TROPONINI 0.03*     BNP (last 3 results)  Recent Labs    03/22/19 1559  BNP 126.8*  CBG (last 3)  Recent Labs    03/22/19 1552  GLUCAP 177*     UA not ordered    CXR -  NON acute    CTA chest -  Bilateral  PE R>L ,multiple pulmonary nodules improved    ECG:  Personally reviewed by me showing: HR : 103  Rhythm:  Sinus tachycardia incomplete RBBB,   no evidence of ischemic changes QTC 480      ED Triage Vitals  Enc Vitals Group     BP 03/22/19 1538 (!) 69/46     Pulse Rate 03/22/19 1538 (!) 120     Resp 03/22/19 1538 18     Temp 03/22/19 1541 97.6 F (36.4 C)     Temp Source 03/22/19 1541 Oral     SpO2 03/22/19 1538 96 %     Weight 03/22/19 1541 138 lb (62.6 kg)     Height 03/22/19 1541 5\' 8"  (1.727 m)     Head Circumference --      Peak Flow --      Pain Score 03/22/19 1541 5     Pain Loc --      Pain Edu? --      Excl. in [place]? --   TMAX(24)@       Latest  Blood pressure 134/69, pulse 94, temperature 97.6 F (36.4 C), temperature source Oral, resp. rate 17, height 5\' 8"  (1.727 m), weight 62.6 kg, SpO2 97 %.     Hospitalist was called for admission for PE   Review of Systems:    Pertinent positives include:  fatigue, chest pain shortness of breath at rest. dyspnea on exertion, Constitutional:  No weight loss, night sweats, Fevers, chills, weight loss  HEENT:  No headaches, Difficulty swallowing,Tooth/dental problems,Sore throat,  No sneezing, itching, ear ache, nasal congestion, post nasal drip,  Cardio-vascular:  No , Orthopnea, PND, anasarca, dizziness, palpitations.no Bilateral lower extremity swelling  GI:  No heartburn, indigestion, abdominal pain, nausea, vomiting, diarrhea, change in bowel habits, loss of appetite, melena, blood in stool, hematemesis Resp:    No excess mucus, no productive cough, No non-productive cough, No coughing up of blood.No change in color of mucus.No wheezing. Skin:  no rash or lesions. No jaundice GU:  no dysuria, change in color of urine, no urgency  or frequency. No straining to urinate.  No flank pain.  Musculoskeletal:  No joint pain or no joint swelling. No decreased range of motion. No back pain.  Psych:  No change in mood or affect. No depression or anxiety. No memory loss.  Neuro: no localizing neurological complaints, no tingling, no weakness, no double vision, no gait abnormality, no slurred speech, no confusion  All systems reviewed and apart from [place] all are negative  Past Medical History:   Past Medical History:  Diagnosis Date  . Cancer ([place])   . Diabetes mellitus without complication [facility])       Past Surgical History:  Procedure Laterality Date  . CHOLECYSTECTOMY    . paratial pancrectomy    . PORTACATH PLACEMENT    . SPLENECTOMY      Social History:  Ambulatory  Kasandra Knudsen,     reports that he has never smoked. He has never used smokeless tobacco. He reports that he does not drink alcohol or use drugs.     Family History:   Family History  Problem Relation Age of Onset  . Stroke Father   . Hypertension Other     Allergies: Allergies  Allergen  Reactions  . Naproxen       Prior to Admission medications   Medication Sig Start Date End Date Taking? Authorizing Provider  acetaminophen (TYLENOL) 500 MG tablet Take 500 mg by mouth every 6 (six) hours as needed (2 tabs as needed for pain).   Yes [provider]  aspirin EC 81 MG tablet Take 81 mg by mouth at bedtime.   Yes [provider]  clopidogrel (PLAVIX) 75 MG tablet Take 75 mg by mouth daily.   Yes [provider]  fluconazole (DIFLUCAN) 200 MG tablet Take 1 tablet (200 mg total) by mouth 2 times daily at 12 noon and 4 pm. For 30 days 02/17/19  Yes Michael Frey, Okey Regal, MD  Insulin Glargine, 1 Unit Dial, (TOUJEO SOLOSTAR) 300 UNIT/ML SOPN Inject 15 Units into the skin 2 (two) times daily.   Yes [provider]  metFORMIN (GLUCOPHAGE) 1000 MG tablet Take 1,000 mg by mouth 2 (two) times daily with a meal.   Yes  [provider]  metoprolol succinate (TOPROL-XL) 25 MG 24 hr tablet Take 25 mg by mouth daily.   Yes [provider]  prochlorperazine (COMPAZINE) 10 MG tablet Take 1 tablet (10 mg total) by mouth every 6 (six) hours as needed for nausea or vomiting. 02/10/19  Yes Wyatt Portela, MD  rosuvastatin (CRESTOR) 10 MG tablet Take 10 mg by mouth daily.   Yes [provider]  lidocaine-prilocaine (EMLA) cream Apply 1 application topically as needed. Patient not taking: Reported on 03/22/2019 02/10/19   Wyatt Portela, MD  nitroGLYCERIN (NITROSTAT) 0.4 MG SL tablet Place 0.4 mg under the tongue every 5 (five) minutes as needed for chest pain.    [provider]   Physical Exam: Blood pressure 134/69, pulse 94, temperature 97.6 F (36.4 C), temperature source Oral, resp. rate 17, height 5\' 8"  (1.727 m), weight 62.6 kg, SpO2 97 %. 1. General:  in No  Acute distress   Chronically ill -appearing 2. Psychological: Alert and Oriented 3. Head/ENT:    Dry Mucous Membranes                          Head Non traumatic, neck supple                            Poor Dentition 4. SKIN:   decreased Skin turgor,  Skin clean Dry and intact no rash 5. Heart: Regular rate and rhythm no Murmur, no Rub or gallop 6. Lungs: Clear to auscultation bilaterally, no wheezes or crackles   7. Abdomen: Soft,  non-tender, Non distended obese  bowel sounds present 8. Lower extremities: no clubbing, cyanosis, no  edema 9. Neurologically Grossly intact, moving all 4 extremities equally  10. MSK: Normal range of motion   All other LABS:     Recent Labs  Lab 03/19/19 0912 03/22/19 1559  WBC 14.3* 53.6*  NEUTROABS 9.9*  --   HGB 11.2* 11.6*  HCT 37.1* 37.4*  MCV 88.3 87.8  PLT 367 284     Recent Labs  Lab 03/19/19 0912 03/22/19 1559  NA 137 136  K 3.9 4.2  CL 102 99  CO2 23 21*  GLUCOSE 187* 238*  BUN 12 25*  CREATININE 0.94 1.06  CALCIUM 8.8* 8.9     Recent Labs  Lab  03/19/19 0912  AST 87*  ALT 53*  ALKPHOS 90  BILITOT 0.2*  PROT 6.0*  ALBUMIN 2.6*       Cultures: No results found for: SDES, SPECREQUEST, CULT, REPTSTATUS   Radiological Exams on Admission: Dg Chest 2 View  Result Date: 03/22/2019 CLINICAL DATA:  78 year old male with shortness of breath and fatigue EXAM: CHEST - 2 VIEW COMPARISON:  Chest x-ray 02/26/2019, CT 02/09/2019 FINDINGS: Cardiomediastinal silhouette unchanged in size and contour. Surgical changes of median sternotomy and CABG. Right IJ port catheter is unchanged. Sclerotic foci of the visualized appendicular and axial skeleton, similar to the comparison CT and plain film. No pneumothorax or pleural effusion.  No confluent airspace disease. Lung nodules are better characterized on recent CT imaging, not visualized on the current study. IMPRESSION: Negative for acute cardiopulmonary disease. Redemonstration of sclerotic metastases of the visualized axial and appendicular skeleton. Right IJ port catheter. Surgical changes of median sternotomy and CABG. Electronically Signed   By: Corrie Mckusick D.O.   On: 03/22/2019 16:11   Ct Angio Chest Pe W And/or Wo Contrast  Result Date: 03/22/2019 CLINICAL DATA:  LEFT chest pain and shortness of breath since 1300 hours. Hyperglycemia and dizziness. EXAM: CT ANGIOGRAPHY CHEST WITH CONTRAST TECHNIQUE: Multidetector CT imaging of the chest was performed using the standard protocol during bolus administration of intravenous contrast. Multiplanar CT image reconstructions and MIPs were obtained to evaluate the vascular anatomy. CONTRAST:  52mL OMNIPAQUE IOHEXOL 350 MG/ML SOLN COMPARISON:  Chest x-ray earlier today. CT chest 02/09/2019 FINDINGS: Cardiovascular: Satisfactory opacification of the pulmonary arteries to the segmental level. BILATERAL pulmonary lower lobe emboli are present, more extensive on the RIGHT. No signs of RIGHT heart strain. Extensive three-vessel coronary artery calcification. Median  sternotomy for CABG. RIGHT chest port catheter. Aortic atherosclerosis. Normal heart size. No pericardial effusion. Mediastinum/Nodes: No enlarged mediastinal, hilar, or axillary lymph nodes. Thyroid gland, trachea, and esophagus demonstrate no significant findings. Lungs/Pleura: Redemonstrated are numerous small pulmonary nodules, most conspicuous in the LEFT upper lobe and superior segment RIGHT lower lobe, with some cavitation in the LEFT upper lobe. No pleural effusion or pneumothorax. These appear slightly improved compared with priors. Upper Abdomen: No acute abnormality. Musculoskeletal: Innumerable sclerotic osseous lesions, nonacute appearing RIGHT-sided rib fractures. Review of the MIP images confirms the above findings. IMPRESSION: 1. BILATERAL pulmonary emboli, greater on the RIGHT. 2. Widespread osseous metastatic disease, secondary to prostate cancer. 3. BILATERAL pulmonary nodules, appears slightly improved compared with priors. Atypical infection is favored. 4. These results were called by telephone at the time of interpretation on 03/22/2019 at 7:05 pm to Dr. Dorie Rank , who verbally acknowledged these results. Aortic Atherosclerosis (ICD10-I70.0). Electronically Signed   By: Staci Righter M.D.   On: 03/22/2019 19:10    Chart has been reviewed    Assessment/Plan  78 y.o. 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 Pulmonary embolus (HCC) -  Admit to  stepdown Initiate heparin drip  Would likely benefit from case manager consult for long term anticoagulation Hold home blood pressure medications avoid hypotension Cycle cardiac enzymes Order echogram and lower extremity Dopplers  Most likely risk factors for hypercoagulable state being   malignancy   Initial hypotension resolved but  will monitor in stepdown   . Prostate cancer [facility]) -chronic will continue to follow-up with oncology patient on chronic prednisone 5 mg twice daily will continue . DM (  diabetes mellitus), type 2 with complications ([place]) -  - Order  Moderate  SSI   - continue home insulin regimen   -  check TSH and HgA1C  - Hold by mouth medications  Currently blood glucose have stabilized  . Dehydration -we will rehydrate and continue to monitor fluid status . CAD (coronary artery disease) -  - chronic, usually on aspirin but will hold since patient is going to be on anticoagulation for now continue statin resume beta blocker when able to tolerate. For now continue with Plavix. Will need to have patient discuss with his primary cardiologist benefit of continuing Plavix unclear when was the last stenting bribed last CABG was in 2005   Leukocytosis - severe, patient recently received Neulasta at baseline his he has leukocytosis around 14 thought to be secondary to steroids.  Obtain CBC with differential to further evaluate if severely abnormal will let oncology know in AM Other plan as per orders.  DVT prophylaxis: on heparin   Code Status:  FULL CODE  as per patient     Family Communication:   Family not at  Bedside    Disposition Plan:      To home once workup is complete and patient is stable                      Consults called: emailed Dr. Alen Frey that pt has been admitted  Admission status:  ED Disposition    ED Disposition Condition [place]: [place] [[id number]]  Level of Care: Stepdown [14]  Admit to SDU based on following criteria: Hemodynamic compromise or significant risk of instability:  Patient requiring short term acute titration and management of vasoactive drips, and invasive monitoring (i.e., CVP and Arterial line).  Covid Evaluation: N/A  Diagnosis: Pulmonary embolus [facility]) [[id number]]  Admitting Physician: Toy Baker [[id number]]   Attending Physician: Toy Baker [[id number]]  PT Class (Do Not Modify): Observation [104]  PT Acc Code (Do Not Modify): Observation [10022]        Obs    Level of care        SDU tele indefinitely please discontinue once patient no longer qualifies  Precautions: NONE  No active isolations  PPE: Used by the provider:   P100  eye Goggles,  Gloves    Redell Bhandari Hettinger 03/22/2019, 10:16 PM     [facility]     after 2 AM please page floor coverage PA If 7AM-7PM, please contact the day team taking care of the patient using [email or web address]

## 2019-03-22 NOTE — Telephone Encounter (Signed)
Tried to reach regarding schedule °

## 2019-03-22 NOTE — Telephone Encounter (Signed)
Called and spoke to patient's daughter Barnett Applebaum. Informed her that per Dr. Alen Blew, PSA is down from 19.9 to 18.9. She verbalized understanding and stated she will relay message to the patient.

## 2019-03-22 NOTE — ED Triage Notes (Signed)
Patient c/o left chest pain and SOB since 1300 today. patient also c/o hyperglycemia and dizziness x 2-3 days.

## 2019-03-22 NOTE — ED Provider Notes (Addendum)
[facility] DEPT Provider Note   CSN: [id number] Arrival date & time: 03/22/19  1526    History   Chief Complaint Chief Complaint  Patient presents with  . Chest Pain  . Hyperglycemia  . Dizziness  . cancer patient    HPI Rushton Early is a 78 y.o. male.     HPI Pt has a history of metastatic prostrate cancer.  Pt is getting chemo, last treatment 5/15.  Pt has been feeling lightheaded and dizzy the last few days.  He has also been feeling short of breath.  His blood sugars have been high but he is getting steroids.  No cough.  No fever.  No new leg swelling but he has had some for a while.  He also started having pain in his chest .  He has a sharp pain in the left chest.  It would come and go, lasting a few seconds associated with palpitations.  He called his cancer doctor up and they told him to come to the ED. Past Medical History:  Diagnosis Date  . Cancer ([place])   . Diabetes mellitus without complication [facility])     Patient Active Problem List   Diagnosis Date Noted  . Port-A-Cath in place 03/19/2019  . 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 Prostate cancer ([place]) 02/04/2019    Past Surgical History:  Procedure Laterality Date  . CHOLECYSTECTOMY    . paratial pancrectomy    . PORTACATH PLACEMENT    . SPLENECTOMY    CABG in 2005 in [place] Medications    Prior to Admission medications   Medication Sig Start Date End Date Taking? Authorizing Provider  acetaminophen (TYLENOL) 500 MG tablet Take 500 mg by mouth every 6 (six) hours as needed (2 tabs as needed for pain).   Yes [provider]  aspirin EC 81 MG tablet Take 81 mg by mouth at bedtime.   Yes [provider]  clopidogrel (PLAVIX) 75 MG tablet Take 75 mg by mouth daily.   Yes [provider]  fluconazole (DIFLUCAN) 200 MG tablet Take 1 tablet (200 mg total) by mouth 2 times daily at 12 noon and 4 pm. For 30 days 02/17/19  Yes  Comer, Okey Regal, MD  Insulin Glargine, 1 Unit Dial, (TOUJEO SOLOSTAR) 300 UNIT/ML SOPN Inject 15 Units into the skin 2 (two) times daily.   Yes [provider]  metFORMIN (GLUCOPHAGE) 1000 MG tablet Take 1,000 mg by mouth 2 (two) times daily with a meal.   Yes [provider]  metoprolol succinate (TOPROL-XL) 25 MG 24 hr tablet Take 25 mg by mouth daily.   Yes [provider]  prochlorperazine (COMPAZINE) 10 MG tablet Take 1 tablet (10 mg total) by mouth every 6 (six) hours as needed for nausea or vomiting. 02/10/19  Yes Wyatt Portela, MD  rosuvastatin (CRESTOR) 10 MG tablet Take 10 mg by mouth daily.   Yes [provider]  lidocaine-prilocaine (EMLA) cream Apply 1 application topically as needed. Patient not taking: Reported on 03/22/2019 02/10/19   Wyatt Portela, MD  nitroGLYCERIN (NITROSTAT) 0.4 MG SL tablet Place 0.4 mg under the tongue every 5 (five) minutes as needed for chest pain.    [provider]    Family History History reviewed. No pertinent family history.  Social History Social History   Tobacco Use  . Smoking status: Never Smoker  . Smokeless tobacco: Never Used  Substance Use Topics  .  Alcohol use: Never    Frequency: Never  . Drug use: Never     Allergies   Naproxen   Review of Systems Review of Systems  All other systems reviewed and are negative.    Physical Exam Updated Vital Signs BP 126/75 (BP Location: Right Arm)   Pulse 94   Temp 97.6 F (36.4 C) (Oral)   Resp 17   Ht 1.727 m (5\' 8" )   Wt 62.6 kg   SpO2 99%   BMI 20.98 kg/m   Physical Exam Vitals signs and nursing note reviewed.  Constitutional:      General: He is not in acute distress.    Appearance: He is well-developed.  HENT:     Head: Normocephalic and atraumatic.     Right Ear: External ear normal.     Left Ear: External ear normal.  Eyes:     General: No scleral icterus.       Right eye: No discharge.        Left eye: No  discharge.     Conjunctiva/sclera: Conjunctivae normal.  Neck:     Musculoskeletal: Neck supple.     Trachea: No tracheal deviation.  Cardiovascular:     Rate and Rhythm: Normal rate and regular rhythm.  Pulmonary:     Effort: Pulmonary effort is normal. No respiratory distress.     Breath sounds: Normal breath sounds. No stridor. No wheezing or rales.  Abdominal:     General: Bowel sounds are normal. There is no distension.     Palpations: Abdomen is soft.     Tenderness: There is no abdominal tenderness. There is no guarding or rebound.  Musculoskeletal:        General: No tenderness.  Skin:    General: Skin is warm and dry.     Findings: No rash.  Neurological:     Mental Status: He is alert.     Cranial Nerves: No cranial nerve deficit (no facial droop, extraocular movements intact, no slurred speech).     Sensory: No sensory deficit.     Motor: No abnormal muscle tone or seizure activity.     Coordination: Coordination normal.      ED Treatments / Results  Labs (all labs ordered are listed, but only abnormal results are displayed) Labs Reviewed  BASIC METABOLIC PANEL - Abnormal; Notable for the following components:      Result Value   CO2 21 (*)    Glucose, Bld 238 (*)    BUN 25 (*)    Anion gap 16 (*)    All other components within normal limits  CBC - Abnormal; Notable for the following components:   WBC 53.6 (*)    Hemoglobin 11.6 (*)    HCT 37.4 (*)    RDW 20.6 (*)    All other components within normal limits  TROPONIN I - Abnormal; Notable for the following components:   Troponin I 0.03 (*)    All other components within normal limits  BRAIN NATRIURETIC PEPTIDE - Abnormal; Notable for the following components:   B Natriuretic Peptide 126.8 (*)    All other components within normal limits  D-DIMER, QUANTITATIVE (NOT AT [facility]) - Abnormal; Notable for the following components:   D-Dimer, Quant 7.41 (*)    All other components within normal limits  CBG  MONITORING, ED - Abnormal; Notable for the following components:   Glucose-Capillary 177 (*)    All other components within normal limits  SARS CORONAVIRUS 2 [facility]  ORDER, PERFORMED IN [facility] LAB)  PATHOLOGIST SMEAR REVIEW    EKG EKG Interpretation  Date/Time:  Monday Mar 22 2019 15:51:13 EDT Ventricular Rate:  103 PR Interval:    QRS Duration: 116 QT Interval:  366 QTC Calculation: 480 R Axis:   -78 Text Interpretation:  Sinus tachycardia Atrial premature complexes Incomplete RBBB and LAFB Probable anterior infarct, age indeterminate No old tracing to compare Confirmed by Dorie Rank [phone]) on 03/22/2019 3:59:03 PM   Radiology Dg Chest 2 View  Result Date: 03/22/2019 CLINICAL DATA:  78 year old male with shortness of breath and fatigue EXAM: CHEST - 2 VIEW COMPARISON:  Chest x-ray 02/26/2019, CT 02/09/2019 FINDINGS: Cardiomediastinal silhouette unchanged in size and contour. Surgical changes of median sternotomy and CABG. Right IJ port catheter is unchanged. Sclerotic foci of the visualized appendicular and axial skeleton, similar to the comparison CT and plain film. No pneumothorax or pleural effusion.  No confluent airspace disease. Lung nodules are better characterized on recent CT imaging, not visualized on the current study. IMPRESSION: Negative for acute cardiopulmonary disease. Redemonstration of sclerotic metastases of the visualized axial and appendicular skeleton. Right IJ port catheter. Surgical changes of median sternotomy and CABG. Electronically Signed   By: Corrie Mckusick D.O.   On: 03/22/2019 16:11   Ct Angio Chest Pe W And/or Wo Contrast  Result Date: 03/22/2019 CLINICAL DATA:  LEFT chest pain and shortness of breath since 1300 hours. Hyperglycemia and dizziness. EXAM: CT ANGIOGRAPHY CHEST WITH CONTRAST TECHNIQUE: Multidetector CT imaging of the chest was performed using the standard protocol during bolus administration of intravenous contrast. Multiplanar  CT image reconstructions and MIPs were obtained to evaluate the vascular anatomy. CONTRAST:  51mL OMNIPAQUE IOHEXOL 350 MG/ML SOLN COMPARISON:  Chest x-ray earlier today. CT chest 02/09/2019 FINDINGS: Cardiovascular: Satisfactory opacification of the pulmonary arteries to the segmental level. BILATERAL pulmonary lower lobe emboli are present, more extensive on the RIGHT. No signs of RIGHT heart strain. Extensive three-vessel coronary artery calcification. Median sternotomy for CABG. RIGHT chest port catheter. Aortic atherosclerosis. Normal heart size. No pericardial effusion. Mediastinum/Nodes: No enlarged mediastinal, hilar, or axillary lymph nodes. Thyroid gland, trachea, and esophagus demonstrate no significant findings. Lungs/Pleura: Redemonstrated are numerous small pulmonary nodules, most conspicuous in the LEFT upper lobe and superior segment RIGHT lower lobe, with some cavitation in the LEFT upper lobe. No pleural effusion or pneumothorax. These appear slightly improved compared with priors. Upper Abdomen: No acute abnormality. Musculoskeletal: Innumerable sclerotic osseous lesions, nonacute appearing RIGHT-sided rib fractures. Review of the MIP images confirms the above findings. IMPRESSION: 1. BILATERAL pulmonary emboli, greater on the RIGHT. 2. Widespread osseous metastatic disease, secondary to prostate cancer. 3. BILATERAL pulmonary nodules, appears slightly improved compared with priors. Atypical infection is favored. 4. These results were called by telephone at the time of interpretation on 03/22/2019 at 7:05 pm to Dr. Dorie Rank , who verbally acknowledged these results. Aortic Atherosclerosis (ICD10-I70.0). Electronically Signed   By: Staci Righter M.D.   On: 03/22/2019 19:10    Procedures .Critical Care Performed by: Dorie Rank, MD Authorized by: Dorie Rank, MD   Critical care provider statement:    Critical care time (minutes):  30   Critical care was time spent personally by me on the  following activities:  Discussions with consultants, evaluation of patient's response to treatment, examination of patient, ordering and performing treatments and interventions, ordering and review of laboratory studies, ordering and review of radiographic studies, pulse oximetry, re-evaluation of patient's condition, obtaining history  from patient or surrogate and review of old charts   (including critical care time)  Medications Ordered in ED Medications  sodium chloride flush (NS) 0.9 % injection 3 mL (3 mLs Intravenous Not Given 03/22/19 1704)  sodium chloride (PF) 0.9 % injection (has no administration in time range)  iohexol (OMNIPAQUE) 350 MG/ML injection 100 mL (50 mLs Intravenous Contrast Given 03/22/19 1810)     Initial Impression / Assessment and Plan / ED Course  I have reviewed the triage vital signs and the nursing notes.  Pertinent labs & imaging results that were available during my care of the patient were reviewed by me and considered in my medical decision making (see chart for details).  Clinical Course as of Mar 21 1942  Mon Mar 22, 2019  1601 Pt is not currently in the room.  Pt is in xray    [JK]  1723 No acute pneumonia or findings on chest x-ray.  White blood cell count is significantly elevated and d-dimer is also elevated.  Patient was given Neulasta and steroids at his recent chemotherapy treatment.   [place] to call a family member.  Patient declined and said he would call them himself.   [JK]    Clinical Course User Index [JK] Dorie Rank, MD     Patient presented to the emergency room for evaluation of shortness of breath.  Patient denied any fevers with significant coughing.  Chest x-ray did not show any evidence of pneumonia.  Code was negative.  Patient is at high risk for pulmonary embolism and had elevated d-dimer.  CT scan was positive for PE.  IV heparin was ordered.  Patient has remained medically stable.  Plan on admission for further  treatment.  Final Clinical Impressions(s) / ED Diagnoses   Final diagnoses:  Acute pulmonary embolism, unspecified pulmonary embolism type, unspecified whether acute cor pulmonale present [facility])      Dorie Rank, MD 03/22/19 Raye Sorrow    Dorie Rank, MD 03/22/19 2118

## 2019-03-23 ENCOUNTER — Observation Stay (HOSPITAL_COMMUNITY): Payer: Medicare Other

## 2019-03-23 ENCOUNTER — Observation Stay (HOSPITAL_BASED_OUTPATIENT_CLINIC_OR_DEPARTMENT_OTHER): Payer: Medicare Other

## 2019-03-23 DIAGNOSIS — Z794 Long term (current) use of insulin: Secondary | ICD-10-CM | POA: Diagnosis not present

## 2019-03-23 DIAGNOSIS — C7802 Secondary malignant neoplasm of left lung: Secondary | ICD-10-CM | POA: Diagnosis present

## 2019-03-23 DIAGNOSIS — I10 Essential (primary) hypertension: Secondary | ICD-10-CM | POA: Diagnosis present

## 2019-03-23 DIAGNOSIS — Z1159 Encounter for screening for other viral diseases: Secondary | ICD-10-CM | POA: Diagnosis not present

## 2019-03-23 DIAGNOSIS — C7951 Secondary malignant neoplasm of bone: Secondary | ICD-10-CM | POA: Diagnosis present

## 2019-03-23 DIAGNOSIS — I2699 Other pulmonary embolism without acute cor pulmonale: Secondary | ICD-10-CM | POA: Diagnosis present

## 2019-03-23 DIAGNOSIS — E1165 Type 2 diabetes mellitus with hyperglycemia: Secondary | ICD-10-CM | POA: Diagnosis present

## 2019-03-23 DIAGNOSIS — Z9081 Acquired absence of spleen: Secondary | ICD-10-CM | POA: Diagnosis not present

## 2019-03-23 DIAGNOSIS — Z7982 Long term (current) use of aspirin: Secondary | ICD-10-CM | POA: Diagnosis not present

## 2019-03-23 DIAGNOSIS — E039 Hypothyroidism, unspecified: Secondary | ICD-10-CM | POA: Diagnosis present

## 2019-03-23 DIAGNOSIS — Z7952 Long term (current) use of systemic steroids: Secondary | ICD-10-CM | POA: Diagnosis not present

## 2019-03-23 DIAGNOSIS — L8990 Pressure ulcer of unspecified site, unspecified stage: Secondary | ICD-10-CM

## 2019-03-23 DIAGNOSIS — Z79899 Other long term (current) drug therapy: Secondary | ICD-10-CM | POA: Diagnosis not present

## 2019-03-23 DIAGNOSIS — E785 Hyperlipidemia, unspecified: Secondary | ICD-10-CM | POA: Diagnosis present

## 2019-03-23 DIAGNOSIS — D72829 Elevated white blood cell count, unspecified: Secondary | ICD-10-CM | POA: Diagnosis present

## 2019-03-23 DIAGNOSIS — I2510 Atherosclerotic heart disease of native coronary artery without angina pectoris: Secondary | ICD-10-CM | POA: Diagnosis present

## 2019-03-23 DIAGNOSIS — E860 Dehydration: Secondary | ICD-10-CM | POA: Diagnosis present

## 2019-03-23 DIAGNOSIS — C7801 Secondary malignant neoplasm of right lung: Secondary | ICD-10-CM | POA: Diagnosis present

## 2019-03-23 DIAGNOSIS — Z951 Presence of aortocoronary bypass graft: Secondary | ICD-10-CM | POA: Diagnosis not present

## 2019-03-23 DIAGNOSIS — E118 Type 2 diabetes mellitus with unspecified complications: Secondary | ICD-10-CM | POA: Diagnosis not present

## 2019-03-23 DIAGNOSIS — R296 Repeated falls: Secondary | ICD-10-CM | POA: Diagnosis present

## 2019-03-23 DIAGNOSIS — C61 Malignant neoplasm of prostate: Secondary | ICD-10-CM | POA: Diagnosis present

## 2019-03-23 DIAGNOSIS — E1142 Type 2 diabetes mellitus with diabetic polyneuropathy: Secondary | ICD-10-CM | POA: Diagnosis present

## 2019-03-23 DIAGNOSIS — Z7902 Long term (current) use of antithrombotics/antiplatelets: Secondary | ICD-10-CM | POA: Diagnosis not present

## 2019-03-23 DIAGNOSIS — Z9221 Personal history of antineoplastic chemotherapy: Secondary | ICD-10-CM | POA: Diagnosis not present

## 2019-03-23 LAB — COMPREHENSIVE METABOLIC PANEL
ALT: 33 U/L (ref 0–44)
AST: 31 U/L (ref 15–41)
Albumin: 2.7 g/dL — ABNORMAL LOW (ref 3.5–5.0)
Alkaline Phosphatase: 83 U/L (ref 38–126)
Anion gap: 9 (ref 5–15)
BUN: 19 mg/dL (ref 8–23)
CO2: 24 mmol/L (ref 22–32)
Calcium: 8.2 mg/dL — ABNORMAL LOW (ref 8.9–10.3)
Chloride: 104 mmol/L (ref 98–111)
Creatinine, Ser: 0.73 mg/dL (ref 0.61–1.24)
GFR calc Af Amer: >60 mL/min (ref >60)
GFR calc non Af Amer: >60 mL/min (ref >60)
Glucose, Bld: 83 mg/dL (ref 70–99)
Potassium: 3.6 mmol/L (ref 3.5–5.1)
Sodium: 137 mmol/L (ref 135–145)
Total Bilirubin: 0.7 mg/dL (ref 0.3–1.2)
Total Protein: 5.5 g/dL — ABNORMAL LOW (ref 6.5–8.1)

## 2019-03-23 LAB — GLUCOSE, CAPILLARY
Glucose-Capillary: 60 mg/dL — ABNORMAL LOW (ref 70–99)
Glucose-Capillary: 80 mg/dL (ref 70–99)
Glucose-Capillary: 80 mg/dL (ref 70–99)
Glucose-Capillary: 173 mg/dL — ABNORMAL HIGH (ref 70–99)
Glucose-Capillary: 259 mg/dL — ABNORMAL HIGH (ref 70–99)
Glucose-Capillary: 157 mg/dL — ABNORMAL HIGH (ref 70–99)

## 2019-03-23 LAB — ECHOCARDIOGRAM COMPLETE
Height: 68 in
Weight: 2208 oz

## 2019-03-23 LAB — HEPARIN LEVEL (UNFRACTIONATED)
Heparin Unfractionated: 0.68 IU/mL (ref 0.30–0.70)
Heparin Unfractionated: 0.74 IU/mL — ABNORMAL HIGH (ref 0.30–0.70)
Heparin Unfractionated: 0.86 IU/mL — ABNORMAL HIGH (ref 0.30–0.70)

## 2019-03-23 LAB — CBC
HCT: 32.8 % — ABNORMAL LOW (ref 39.0–52.0)
Hemoglobin: 10.3 g/dL — ABNORMAL LOW (ref 13.0–17.0)
MCH: 27.3 pg (ref 26.0–34.0)
MCHC: 31.4 g/dL (ref 30.0–36.0)
MCV: 87 fL (ref 80.0–100.0)
Platelets: 237 10*3/uL (ref 150–400)
RBC: 3.77 MIL/uL — ABNORMAL LOW (ref 4.22–5.81)
RDW: 20.2 % — ABNORMAL HIGH (ref 11.5–15.5)
WBC: 40.3 10*3/uL — ABNORMAL HIGH (ref 4.0–10.5)
nRBC: 0 % (ref 0.0–0.2)

## 2019-03-23 LAB — HEMOGLOBIN A1C
Hgb A1c MFr Bld: 8.9 % — ABNORMAL HIGH (ref 4.8–5.6)
Mean Plasma Glucose: 208.73 mg/dL

## 2019-03-23 LAB — TROPONIN I
Troponin I: <0.03 ng/mL (ref <0.03)
Troponin I: <0.03 ng/mL (ref <0.03)

## 2019-03-23 LAB — MRSA PCR SCREENING: MRSA by PCR: NEGATIVE

## 2019-03-23 LAB — MAGNESIUM: Magnesium: 1.6 mg/dL — ABNORMAL LOW (ref 1.7–2.4)

## 2019-03-23 LAB — PHOSPHORUS: Phosphorus: 3.7 mg/dL (ref 2.5–4.6)

## 2019-03-23 LAB — TSH: TSH: 2.034 u[IU]/mL (ref 0.350–4.500)

## 2019-03-23 MED ORDER — INSULIN ASPART 100 UNIT/ML ~~LOC~~ SOLN
0.0000 [IU] | Freq: Three times a day (TID) | SUBCUTANEOUS | Status: DC
  Administered 2019-03-23: 5 [IU] via SUBCUTANEOUS
  Administered 2019-03-23: 2 [IU] via SUBCUTANEOUS

## 2019-03-23 MED ORDER — INSULIN GLARGINE 100 UNIT/ML ~~LOC~~ SOLN
7.0000 [IU] | Freq: Two times a day (BID) | SUBCUTANEOUS | Status: DC
  Administered 2019-03-23 – 2019-03-24 (×3): 7 [IU] via SUBCUTANEOUS
  Filled 2019-03-23 (×3): qty 0.07

## 2019-03-23 MED ORDER — MAGNESIUM SULFATE 2 GM/50ML IV SOLN
2.0000 g | Freq: Once | INTRAVENOUS | Status: AC
  Administered 2019-03-23: 2 g via INTRAVENOUS
  Filled 2019-03-23: qty 50

## 2019-03-23 MED ORDER — HEPARIN (PORCINE) 25000 UT/250ML-% IV SOLN
900.0000 [IU]/h | INTRAVENOUS | Status: DC
  Administered 2019-03-23: 1050 [IU]/h via INTRAVENOUS

## 2019-03-23 NOTE — Care Management Obs Status (Signed)
[place] NOTIFICATION   Patient Details  Name: Michael Frey MRN: [id number] Date of Birth: [birth year]   Medicare Observation Status Notification Given:  Yes    Joaquin Courts, RN 03/23/2019, 3:28 PM

## 2019-03-23 NOTE — Progress Notes (Signed)
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  Also, patient complained of shortness of breath on exertion.  Patient blood pressure was low 73/45 as per family.  Also reported palpitations and occasional chest pain. CTA chest in the ED showed bilateral pulmonary embolism, greater on the right.  Patient started on IV heparin.   Subjective   Patient seen and examined, denies shortness of breath or chest pain.   Assessment/Plan:     1. Pulmonary embolism-CT chest showed bilateral pulmonary embolism, no heart strain noted on CT chest.  Bilateral venous duplex of lower extremities negative for DVT.  Echocardiogram is currently pending.  We will continue with IV heparin.  Hemodynamically stable.  Will switch to Xarelto or Eliquis at the time of discharge.  2. Leukocytosis-secondary to growth factor support as per oncology.  We will continue to monitor patient's WBC in the hospital.  3. 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  He has been on Diflucan in the past.  He was seen by ID on 02/17/2019 and got a refill for Diflucan.  Patient will need prolonged treatment for 6 to 12 months for cryptococcal pneumonitis as per ID.  4. Diabetes mellitus type 2-patient is currently on sliding scale insulin NovoLog, he became hypoglycemic this morning.  Lantus dose cut down to 7 units subcu twice daily, sliding scale insulin changed to sensitive.  Continue to monitor patient CBG in the hospital.  5. CAD-stable, aspirin on hold, continue beta-blocker, Plavix.  Patient's CABG was in  2005.        CBG: Recent Labs  Lab 03/22/19 2343 03/23/19 0325 03/23/19 0402 03/23/19 0813 03/23/19 1146  GLUCAP 153* 60* 80 80 173*    CBC: Recent Labs  Lab 03/19/19 0912 03/22/19 1559 03/22/19 2252 03/23/19 0400  WBC 14.3* 53.6* 48.9* 40.3*  NEUTROABS 9.9*  --  42.6*  --   HGB 11.2* 11.6* 10.9* 10.3*  HCT 37.1* 37.4* 34.9* 32.8*  MCV 88.3 87.8 86.0 87.0  PLT 367 284 230 865    Basic Metabolic Panel: Recent Labs  Lab 03/19/19 0912 03/22/19 1559 03/23/19 0400  NA 137 136 137  K 3.9 4.2 3.6  CL 102 99 104  CO2 23 21* 24  GLUCOSE 187* 238* 83  BUN 12 25* 19  CREATININE 0.94 1.06 0.73  CALCIUM 8.8* 8.9 8.2*  MG  --   --  1.6*  PHOS  --   --  3.7     DVT prophylaxis: Patient on heparin  Code Status: Full code  Family Communication: No family at bedside  Disposition Plan: likely home when medically ready for discharge     Consultants:    Procedures:     Antibiotics:   Anti-infectives (From admission, onward)   Start     Dose/Rate Route Frequency Ordered Stop   03/23/19 1200  fluconazole (DIFLUCAN) tablet 200 mg    Note to Pharmacy:  For 30 days     200 mg Oral 2 times daily 03/22/19 2230         Objective   Vitals:   03/23/19 1108 03/23/19 1200 03/23/19 1300 03/23/19 1400  BP:  135/82 Marland Kitchen)  142/82 (!) 141/71  Pulse: (!) 104 92 87 92  Resp: 18 16 19 20   Temp:      TempSrc:      SpO2: 95% 98% 97% 95%  Weight:      Height:        Intake/Output Summary (Last 24 hours) at 03/23/2019 1432 Last data filed at 03/23/2019 1400 Gross per 24 hour  Intake 1455.19 ml  Output 300 ml  Net 1155.19 ml   Filed Weights   03/22/19 1541  Weight: 62.6 kg     Physical Examination:  General-appears in no acute distress Heart-S1-S2, regular, no murmur auscultated Lungs-clear to auscultation bilaterally, no wheezing or crackles auscultated Abdomen-soft, nontender, no organomegaly Extremities-no edema in the lower  extremities Neuro-alert, oriented x3, no focal deficit noted    Data Reviewed: I have personally reviewed following labs and imaging studies   Recent Results (from the past 240 hour(s))  SARS Coronavirus 2 (CEPHEID - Performed in [place] hospital lab), Hosp Order     Status: None   Collection Time: 03/22/19  4:40 PM  Result Value Ref Range Status   SARS Coronavirus 2 NEGATIVE NEGATIVE Final    Comment: (NOTE) If result is NEGATIVE SARS-CoV-2 target nucleic acids are NOT DETECTED. The SARS-CoV-2 RNA is generally detectable in upper and lower  respiratory specimens during the acute phase of infection. The lowest  concentration of SARS-CoV-2 viral copies this assay can detect is 250  copies / mL. A negative result does not preclude SARS-CoV-2 infection  and should not be used as the sole basis for treatment or other  patient management decisions.  A negative result may occur with  improper specimen collection / handling, submission of specimen other  than nasopharyngeal swab, presence of viral mutation(s) within the  areas targeted by this assay, and inadequate number of viral copies  (<250 copies / mL). A negative result must be combined with clinical  observations, patient history, and epidemiological information. If result is POSITIVE SARS-CoV-2 target nucleic acids are DETECTED. The SARS-CoV-2 RNA is generally detectable in upper and lower  respiratory specimens dur ing the acute phase of infection.  Positive  results are indicative of active infection with SARS-CoV-2.  Clinical  correlation with patient history and other diagnostic information is  necessary to determine patient infection status.  Positive results do  not rule out bacterial infection or co-infection with other viruses. If result is PRESUMPTIVE POSTIVE SARS-CoV-2 nucleic acids MAY BE PRESENT.   A presumptive positive result was obtained on the submitted specimen  and confirmed on repeat testing.  While 2019  novel coronavirus  (SARS-CoV-2) nucleic acids may be present in the submitted sample  additional confirmatory testing may be necessary for epidemiological  and / or clinical management purposes  to differentiate between  SARS-CoV-2 and other Sarbecovirus currently known to infect humans.  If clinically indicated additional testing with an alternate test  methodology [phone]) is advised. The SARS-CoV-2 RNA is generally  detectable in upper and lower respiratory sp ecimens during the acute  phase of infection. The expected result is Negative. Fact Sheet for Patients:  StrictlyIdeas.no Fact Sheet for Healthcare Providers: BankingDealers.co.za This test is not yet approved or cleared by the [place] FDA and has been authorized for detection and/or diagnosis of SARS-CoV-2 by FDA under an Emergency Use Authorization (EUA).  This EUA will remain in effect (meaning this test can be used) for the duration of the COVID-19 declaration under Section 564(b)(1) of the Act, 21  U.S.C. section 360bbb-3(b)(1), unless the authorization is terminated or revoked sooner. Performed at [facility], [place] [address], [place], [place] [address]   MRSA PCR Screening     Status: None   Collection Time: 03/22/19 10:36 PM  Result Value Ref Range Status   MRSA by PCR NEGATIVE NEGATIVE Final    Comment:        The GeneXpert MRSA Assay (FDA approved for NASAL specimens only), is one component of a comprehensive MRSA colonization surveillance program. It is not intended to diagnose MRSA infection nor to guide or monitor treatment for MRSA infections. Performed at [facility], [place] [address], [place], [place] [address]      Liver Function Tests: Recent Labs  Lab 03/19/19 0912 03/23/19 0400  AST 87* 31  ALT 53* 33  ALKPHOS 90 83  BILITOT 0.2* 0.7  PROT 6.0* 5.5*  ALBUMIN 2.6* 2.7*   No results for input(s):  LIPASE, AMYLASE in the last 168 hours. No results for input(s): AMMONIA in the last 168 hours.  Cardiac Enzymes: Recent Labs  Lab 03/22/19 1559 03/22/19 2232 03/23/19 0400 03/23/19 1224  TROPONINI 0.03* <0.03 <0.03 <0.03   BNP (last 3 results) Recent Labs    03/22/19 1559  BNP 126.8*    ProBNP (last 3 results) No results for input(s): PROBNP in the last 8760 hours.    Studies: Dg Chest 2 View  Result Date: 03/22/2019 CLINICAL DATA:  78 year old male with shortness of breath and fatigue EXAM: CHEST - 2 VIEW COMPARISON:  Chest x-ray 02/26/2019, CT 02/09/2019 FINDINGS: Cardiomediastinal silhouette unchanged in size and contour. Surgical changes of median sternotomy and CABG. Right IJ port catheter is unchanged. Sclerotic foci of the visualized appendicular and axial skeleton, similar to the comparison CT and plain film. No pneumothorax or pleural effusion.  No confluent airspace disease. Lung nodules are better characterized on recent CT imaging, not visualized on the current study. IMPRESSION: Negative for acute cardiopulmonary disease. Redemonstration of sclerotic metastases of the visualized axial and appendicular skeleton. Right IJ port catheter. Surgical changes of median sternotomy and CABG. Electronically Signed   By: Corrie Mckusick D.O.   On: 03/22/2019 16:11   Ct Angio Chest Pe W And/or Wo Contrast  Result Date: 03/22/2019 CLINICAL DATA:  LEFT chest pain and shortness of breath since 1300 hours. Hyperglycemia and dizziness. EXAM: CT ANGIOGRAPHY CHEST WITH CONTRAST TECHNIQUE: Multidetector CT imaging of the chest was performed using the standard protocol during bolus administration of intravenous contrast. Multiplanar CT image reconstructions and MIPs were obtained to evaluate the vascular anatomy. CONTRAST:  43mL OMNIPAQUE IOHEXOL 350 MG/ML SOLN COMPARISON:  Chest x-ray earlier today. CT chest 02/09/2019 FINDINGS: Cardiovascular: Satisfactory opacification of the pulmonary  arteries to the segmental level. BILATERAL pulmonary lower lobe emboli are present, more extensive on the RIGHT. No signs of RIGHT heart strain. Extensive three-vessel coronary artery calcification. Median sternotomy for CABG. RIGHT chest port catheter. Aortic atherosclerosis. Normal heart size. No pericardial effusion. Mediastinum/Nodes: No enlarged mediastinal, hilar, or axillary lymph nodes. Thyroid gland, trachea, and esophagus demonstrate no significant findings. Lungs/Pleura: Redemonstrated are numerous small pulmonary nodules, most conspicuous in the LEFT upper lobe and superior segment RIGHT lower lobe, with some cavitation in the LEFT upper lobe. No pleural effusion or pneumothorax. These appear slightly improved compared with priors. Upper Abdomen: No acute abnormality. Musculoskeletal: Innumerable sclerotic osseous lesions, nonacute appearing RIGHT-sided rib fractures. Review of the MIP images confirms the above findings. IMPRESSION: 1. BILATERAL pulmonary emboli, greater  on the RIGHT. 2. Widespread osseous metastatic disease, secondary to prostate cancer. 3. BILATERAL pulmonary nodules, appears slightly improved compared with priors. Atypical infection is favored. 4. These results were called by telephone at the time of interpretation on 03/22/2019 at 7:05 pm to Dr. Dorie Rank , who verbally acknowledged these results. Aortic Atherosclerosis (ICD10-I70.0). Electronically Signed   By: Staci Righter M.D.   On: 03/22/2019 19:10   Vas [place] Lower Extremity Venous (dvt)  Result Date: 03/23/2019  Lower Venous Study Indications: Pulmonary embolism.  Performing Technologist: Abram Sander RVS  Examination Guidelines: A complete evaluation includes B-mode imaging, spectral Doppler, color Doppler, and power Doppler as needed of all accessible portions of each vessel. Bilateral testing is considered an integral part of a complete examination. Limited examinations for reoccurring indications may be performed as noted.   +---------+---------------+---------+-----------+----------+-------+ RIGHT    CompressibilityPhasicitySpontaneityPropertiesSummary +---------+---------------+---------+-----------+----------+-------+ CFV      Full           Yes      Yes                          +---------+---------------+---------+-----------+----------+-------+ SFJ      Full                                                 +---------+---------------+---------+-----------+----------+-------+ FV Prox  Full                                                 +---------+---------------+---------+-----------+----------+-------+ FV Mid   Full                                                 +---------+---------------+---------+-----------+----------+-------+ FV DistalFull                                                 +---------+---------------+---------+-----------+----------+-------+ PFV      Full                                                 +---------+---------------+---------+-----------+----------+-------+ POP      Full           Yes      Yes                          +---------+---------------+---------+-----------+----------+-------+ PTV      Full                                                 +---------+---------------+---------+-----------+----------+-------+ PERO     Full                                                 +---------+---------------+---------+-----------+----------+-------+   +---------+---------------+---------+-----------+----------+--------------+  LEFT     CompressibilityPhasicitySpontaneityPropertiesSummary        +---------+---------------+---------+-----------+----------+--------------+ CFV      Full           Yes      Yes                                 +---------+---------------+---------+-----------+----------+--------------+ SFJ      Full                                                         +---------+---------------+---------+-----------+----------+--------------+ FV Prox  Full                                                        +---------+---------------+---------+-----------+----------+--------------+ FV Mid   Full                                                        +---------+---------------+---------+-----------+----------+--------------+ FV DistalFull                                                        +---------+---------------+---------+-----------+----------+--------------+ PFV      Full                                                        +---------+---------------+---------+-----------+----------+--------------+ POP      Full           Yes      Yes                                 +---------+---------------+---------+-----------+----------+--------------+ PTV      Full                                                        +---------+---------------+---------+-----------+----------+--------------+ PERO                                                  Not visualized +---------+---------------+---------+-----------+----------+--------------+     Summary: Right: There is no evidence of deep vein thrombosis in the lower extremity. No cystic structure found in the popliteal fossa. Left: There is no evidence of deep vein thrombosis in the lower extremity. No cystic structure found in the popliteal fossa.  *See table(s)  above for measurements and observations.    Preliminary     Scheduled Meds: . Chlorhexidine Gluconate Cloth  6 each Topical Daily  . clopidogrel  75 mg Oral Daily  . fluconazole  200 mg Oral q12n4p  . insulin aspart  0-9 Units Subcutaneous TID WC  . insulin glargine  7 Units Subcutaneous BID  . mouth rinse  15 mL Mouth Rinse BID  . predniSONE  5 mg Oral BID WC  . rosuvastatin  10 mg Oral Daily  . sodium chloride flush  10-40 mL Intracatheter Q12H  . sodium chloride flush  3 mL Intravenous Once    Admission  status: Observation- Based on patients clinical presentation and evaluation of above clinical data, I have made determination that patient meets Inpatient criteria at this time.  Time spent: 20 min  [facility] Pager [phone]. If 7PM-7AM, please contact night-coverage at [email or web address], Office  [phone]  password Between  03/23/2019, 2:32 PM  LOS: 0 days

## 2019-03-23 NOTE — TOC Initial Note (Addendum)
Transition of Care [facility]) - Initial/Assessment Note    Patient Details  Name: Michael Frey MRN: [id number] Date of Birth: [birth year]  [facility] CM/SW Contact:    Joaquin Courts, RN Phone Number: 03/23/2019, 12:15 PM  Clinical Narrative:   CM spoke with pt who reports he has Medicare part D for prescription drug coverage.                  Expected Discharge Plan: Home/Self Care Barriers to Discharge: Continued Medical Work up   Patient Goals and CMS Choice        Expected Discharge Plan and Services Expected Discharge Plan: Home/Self Care   Discharge Planning Services: CM Consult   Living arrangements for the past 2 months: Single Family Home Expected Discharge Date: (unknown)                                    Prior Living Arrangements/Services Living arrangements for the past 2 months: Single Family Home Lives with:: Adult Children Patient language and need for interpreter reviewed:: Yes Do you feel safe going back to the place where you live?: Yes      Need for Family Participation in Patient Care: Yes (Comment) Care giver support system in place?: Yes (comment)   Criminal Activity/Legal Involvement Pertinent to Current Situation/Hospitalization: No - Comment as needed  Activities of Daily Living Home Assistive Devices/Equipment: Cane (specify quad or straight), Eyeglasses, Dentures (specify type), CBG Meter(single point cane, upper denture) ADL Screening (condition at time of admission) Patient's cognitive ability adequate to safely complete daily activities?: Yes Is the patient deaf or have difficulty hearing?: No Does the patient have difficulty seeing, even when wearing glasses/contacts?: No Does the patient have difficulty concentrating, remembering, or making decisions?: No Patient able to express need for assistance with ADLs?: Yes Does the patient have difficulty dressing or bathing?: No Independently performs ADLs?: Yes  (appropriate for developmental age) Does the patient have difficulty walking or climbing stairs?: Yes(secondary to weakness) Weakness of Legs: Both Weakness of Arms/Hands: Both  Permission Sought/Granted                  Emotional Assessment Appearance:: Appears stated age Attitude/Demeanor/Rapport: Engaged Affect (typically observed): Accepting Orientation: : Oriented to Self, Oriented to Place, Oriented to Situation, Oriented to  Time Alcohol / Substance Use: Never Used Psych Involvement: No (comment)  Admission diagnosis:  Acute pulmonary embolism, unspecified pulmonary embolism type, unspecified whether acute cor pulmonale present [facility]) [I26.99] Patient Active Problem List   Diagnosis Date Noted  . Pressure injury of skin 03/23/2019  . Pulmonary embolus ([place]) 03/22/2019  . DM (diabetes mellitus), type 2 with complications ([place]) 54/62/7035  . Dehydration 03/22/2019  . CAD (coronary artery disease) 03/22/2019  . Leukocytosis 03/22/2019  . Port-A-Cath in place 03/19/2019  . Cryptococcal pneumonitis ([place]) 02/17/2019  . Prostate cancer ([place]) 02/04/2019   PCP:  System, Pcp Not In Pharmacy:   [id number] - [place], [place] - [address] [place] [address] Phone: [phone] Fax: [phone]     Social Determinants of Health (SDOH) Interventions    Readmission Risk Interventions Readmission Risk Prevention Plan 03/23/2019  Transportation Screening Complete  PCP or Specialist Appt within 3-5 Days Not Complete  Not Complete comments not yet ready for d/c   HRI or Home Care Consult Complete  Social Work Consult for [place] Planning/Counseling Not Complete  SW consult not completed comments not needed at this time  Palliative Care Screening Not Applicable  Medication Review Press photographer) Complete

## 2019-03-23 NOTE — Progress Notes (Signed)
ANTICOAGULATION CONSULT NOTE - Follow Up Consult  Pharmacy Consult for Heparin Indication: pulmonary embolus  Allergies  Allergen Reactions  . Naproxen     Patient Measurements: Height: 5\' 8"  (172.7 cm) Weight: 138 lb (62.6 kg) IBW/kg (Calculated) : 68.4 Heparin Dosing Weight:   Vital Signs: Temp: 98.7 F (37.1 C) (05/19 0400) Temp Source: Oral (05/19 0400) BP: 131/61 (05/19 0400) Pulse Rate: 94 (05/19 0400)  Labs: Recent Labs    03/22/19 1559 03/22/19 2232 03/22/19 2252 03/23/19 0400  HGB 11.6*  --  10.9* 10.3*  HCT 37.4*  --  34.9* 32.8*  PLT 284  --  230 237  HEPARINUNFRC  --   --   --  0.68  CREATININE 1.06  --   --  0.73  TROPONINI 0.03* <0.03  --  <0.03    Estimated Creatinine Clearance: 65.2 mL/min (by C-G formula based on SCr of 0.73 mg/dL).   Medications:  Infusions:  . sodium chloride 250 mL/hr at 03/23/19 0000  . heparin 1,100 Units/hr (03/23/19 0000)    Assessment: Patient with heparin at upper end of goal.  No heparin issues noted.  While pharmacy protocol would recommend empiricially decrease heparin due to upper end of goal, I feel in patient with + bilateral PE and metastatic cancer, the rate might drop.    Goal of Therapy:  Heparin level 0.3-0.7 units/ml Monitor platelets by anticoagulation protocol: Yes   Plan:  Continue heparin drip at current rate Recheck level at [place], [place] 03/23/2019,5:35 AM

## 2019-03-23 NOTE — Consult Note (Signed)
[place] Nurse wound consult note Reason for Consult: Consult requested for sacrum.  Pt has a stage 1 pressure injury to this location, according to the nursing flowsheet. Spoke to the bedside nurse to confirm the assessment via phone call.  Standing nursing orders are available that recommend foam dressings for stage 1 pressure injuries, and a WOC consult is not needed at this time. Pt is on a low airloss mattress to reduce pressure.  Pressure Injury POA: Yes Please re-consult if further assistance is needed.  Thank-you,  Julien Girt MSN, [place], [place], [place], [place]

## 2019-03-23 NOTE — Progress Notes (Signed)
Hypoglycemic Event  CBG: 60  Treatment: 4 oz orange juice given  Symptoms: none  Follow-up CBG: Time: 0402 CBG Result: 80  Possible Reasons for Event: had not eaten much throughout the day  Comments/MD notified: Pt drank another 4 oz of orange juice after follow-up CBG as well as ate some cheese and saltine crackers.     [place]

## 2019-03-23 NOTE — Progress Notes (Signed)
Inpatient Diabetes Program Recommendations  AACE/ADA: New Consensus Statement on Inpatient Glycemic Control (2015)  Target Ranges:  Prepandial:   less than 140 mg/dL      Peak postprandial:   less than 180 mg/dL (1-2 hours)      Critically ill patients:  140 - 180 mg/dL   Results for Michael Frey, Michael Frey (MRN [id number]) as of 03/23/2019 08:05  Ref. Range 03/22/2019 15:52 03/22/2019 23:43 03/23/2019 03:25 03/23/2019 04:02  Glucose-Capillary Latest Ref Range: 70 - 99 mg/dL 177 (H) 153 (H)  3 units NOVOLOG +  15 units LANTUS  60 (L) 80   Results for Michael Frey, Michael Frey (MRN [id number]) as of 03/23/2019 08:05  Ref. Range 03/23/2019 04:00  Hemoglobin A1C Latest Ref Range: 4.8 - 5.6 % 8.9 (H)  (208 mg/dl)     Admit with: PE, Dehydration, Hyperglycemia  History: DM, Prostate cancer with disease to the bone diagnosed in 2017, Pulmonary metastases found in April 2019  Home DM Meds: Toujeo (insulin glargine) 15 units BID       Metformin 1000 mg BID  Current Orders: Novolog Moderate Correction Scale/ SSI (0-15 units) Q4 hours      Lantus 15 units BID       Last Chemo treatment given 05/15.  Lantus and Novolog SSi started at [place].  Hypoglycemic at 3:30am today.    MD- Please consider the following in-hospital insulin adjustments:   1. Reduce Novolog SSi to Sensitive scale (0-9 units) Q4 hours   2. Reduce Lantus to 7 units BID (50% home dose)     --Will follow patient during hospitalization--  Wyn Quaker RN, MSN, CDE Diabetes Coordinator Inpatient Glycemic Control Team Team Pager: [phone] (8a-5p)

## 2019-03-23 NOTE — Progress Notes (Signed)
IP PROGRESS NOTE  Subjective:   Events in the last 24 hours as noted.  [name] is known to me with history of prostate cancer transferred from [place] to resume his treatment locally.  He received 2 cycles of chemotherapy last of which given on Mar 19, 2019 with Jevtana.  He also received growth factor support in the form of Neulasta.  He presented with symptoms of shortness of breath, hypotension and hypoglycemia.  CT scan of the chest showed bilateral pulmonary emboli currently on heparin.  He has a stabilized clinically with blood pressure close to normal range of 124/67 saturation of 95% without any fevers overnight.  He continues to be in stepdown unit.  Objective:  Vital signs in last 24 hours: Temp:  [97.6 F (36.4 C)-98.8 F (37.1 C)] 98.7 F (37.1 C) (05/19 0400) Pulse Rate:  [90-120] 90 (05/19 0700) Resp:  [11-24] 16 (05/19 0700) BP: (69-137)/(46-86) 124/67 (05/19 0700) SpO2:  [93 %-99 %] 95 % (05/19 0700) Weight:  [138 lb (62.6 kg)] 138 lb (62.6 kg) (05/18 1541) Weight change:  Last BM Date: 03/21/19  Intake/Output from previous day: 05/18 0701 - 05/19 0700 In: 958 [I.V.:958] Out: -    Lab Results: Recent Labs    03/22/19 2252 03/23/19 0400  WBC 48.9* 40.3*  HGB 10.9* 10.3*  HCT 34.9* 32.8*  PLT 230 237    BMET Recent Labs    03/22/19 1559 03/23/19 0400  NA 136 137  K 4.2 3.6  CL 99 104  CO2 21* 24  GLUCOSE 238* 83  BUN 25* 19  CREATININE 1.06 0.73  CALCIUM 8.9 8.2*    Studies/Results: Dg Chest 2 View  Result Date: 03/22/2019 CLINICAL DATA:  78 year old male with shortness of breath and fatigue EXAM: CHEST - 2 VIEW COMPARISON:  Chest x-ray 02/26/2019, CT 02/09/2019 FINDINGS: Cardiomediastinal silhouette unchanged in size and contour. Surgical changes of median sternotomy and CABG. Right IJ port catheter is unchanged. Sclerotic foci of the visualized appendicular and axial skeleton, similar to the comparison CT and plain film. No  pneumothorax or pleural effusion.  No confluent airspace disease. Lung nodules are better characterized on recent CT imaging, not visualized on the current study. IMPRESSION: Negative for acute cardiopulmonary disease. Redemonstration of sclerotic metastases of the visualized axial and appendicular skeleton. Right IJ port catheter. Surgical changes of median sternotomy and CABG. Electronically Signed   By: Corrie Mckusick D.O.   On: 03/22/2019 16:11   Ct Angio Chest Pe W And/or Wo Contrast  Result Date: 03/22/2019 CLINICAL DATA:  LEFT chest pain and shortness of breath since 1300 hours. Hyperglycemia and dizziness. EXAM: CT ANGIOGRAPHY CHEST WITH CONTRAST TECHNIQUE: Multidetector CT imaging of the chest was performed using the standard protocol during bolus administration of intravenous contrast. Multiplanar CT image reconstructions and MIPs were obtained to evaluate the vascular anatomy. CONTRAST:  38mL OMNIPAQUE IOHEXOL 350 MG/ML SOLN COMPARISON:  Chest x-ray earlier today. CT chest 02/09/2019 FINDINGS: Cardiovascular: Satisfactory opacification of the pulmonary arteries to the segmental level. BILATERAL pulmonary lower lobe emboli are present, more extensive on the RIGHT. No signs of RIGHT heart strain. Extensive three-vessel coronary artery calcification. Median sternotomy for CABG. RIGHT chest port catheter. Aortic atherosclerosis. Normal heart size. No pericardial effusion. Mediastinum/Nodes: No enlarged mediastinal, hilar, or axillary lymph nodes. Thyroid gland, trachea, and esophagus demonstrate no significant findings. Lungs/Pleura: Redemonstrated are numerous small pulmonary nodules, most conspicuous in the LEFT upper lobe and superior segment RIGHT lower lobe, with some cavitation in the  LEFT upper lobe. No pleural effusion or pneumothorax. These appear slightly improved compared with priors. Upper Abdomen: No acute abnormality. Musculoskeletal: Innumerable sclerotic osseous lesions, nonacute appearing  RIGHT-sided rib fractures. Review of the MIP images confirms the above findings. IMPRESSION: 1. BILATERAL pulmonary emboli, greater on the RIGHT. 2. Widespread osseous metastatic disease, secondary to prostate cancer. 3. BILATERAL pulmonary nodules, appears slightly improved compared with priors. Atypical infection is favored. 4. These results were called by telephone at the time of interpretation on 03/22/2019 at 7:05 pm to Dr. Dorie Rank , who verbally acknowledged these results. Aortic Atherosclerosis (ICD10-I70.0). Electronically Signed   By: Staci Righter M.D.   On: 03/22/2019 19:10    Medications: I have reviewed the patient's current medications.  Assessment/Plan:  78 year old with:  1.  Advanced prostate cancer with disease to the bone.  Is also bilateral pulmonary nodules could be due to to previous infection that he has been treated in the past with antifungal agents.  He is status post 2 cycles of chemotherapy for palliative purposes.  Chemotherapy will be on hold until he is fully recovered at this time.  2.  Bilateral pulmonary emboli: I agree with the current management is currently on heparin.  I would recommend transitioning into Eliquis or Xarelto upon discharge.  3.  Leukocytosis: Related to growth factor support rather than sepsis.  Continue to monitor counts which I anticipate will continue to improve.  4.  Hyperglycemia: Management as per the primary team.  5.  Pulmonary nodules: He has previous history with fungal infection has been on Diflucan in the past.  He has been followed by infectious disease prior to relocating to this area.  We will continue to follow his progress with you while he is hospitalized.   LOS: 0 days   Zola Button 03/23/2019, 7:36 AM

## 2019-03-23 NOTE — Progress Notes (Signed)
  Echocardiogram 2D Echocardiogram has been performed. Technically difficult study due to small rib spacing.   Aurie Harroun L Androw 03/23/2019, 3:11 PM

## 2019-03-23 NOTE — Progress Notes (Signed)
[facility] for Heparin Indication: pulmonary embolus  Allergies  Allergen Reactions  . Naproxen     Patient Measurements: Height: 5\' 8"  (172.7 cm) Weight: 138 lb (62.6 kg) IBW/kg (Calculated) : 68.4  Vital Signs: Temp: 98.7 F (37.1 C) (05/19 0400) Temp Source: Oral (05/19 0400) BP: 142/82 (05/19 1300) Pulse Rate: 87 (05/19 1300)  Labs: Recent Labs    03/22/19 1559 03/22/19 2232 03/22/19 2252 03/23/19 0400 03/23/19 1224  HGB 11.6*  --  10.9* 10.3*  --   HCT 37.4*  --  34.9* 32.8*  --   PLT 284  --  230 237  --   HEPARINUNFRC  --   --   --  0.68 0.74*  CREATININE 1.06  --   --  0.73  --   TROPONINI 0.03* <0.03  --  <0.03  --     Estimated Creatinine Clearance: 65.2 mL/min (by C-G formula based on SCr of 0.73 mg/dL).   No anticoagulants PTA  Assessment: 78 yo M with hx metastatic prostate cancer currently undergoing palliative chemotherapy & treatment for cryptococcal PNA who presents with chest pain.  Chest CT + new bilateral PE.  No bleeding noted.  Hg slightly low but at baseline, pltc WNL.   Last chemo 5/15- WBC elevated post- Neulasta.  03/23/2019 Confirmatory heparin level is at the high end of desired goal at 0.74 on heparin drip at 1100 units/hr.  CBC stable, no bleeding reported. Onc recs DC on DOAC.   Goal of Therapy:  Heparin level 0.3-0.7 units/ml Monitor platelets by anticoagulation protocol: Yes   Plan:  Decrease heparin to 1050 units/hr and check 8 hr heparin level Daily heparin level & CBC while on heparin F/u for transition to [place], Pharm.D [phone] 03/23/2019 1:24 PM

## 2019-03-23 NOTE — Progress Notes (Signed)
Lower extremity venous has been completed.   Preliminary results in CV Proc.   Abram Sander 03/23/2019 8:42 AM

## 2019-03-23 NOTE — Progress Notes (Signed)
ANTICOAGULATION CONSULT NOTE - Follow Up Consult  Pharmacy Consult for Heparin Indication: pulmonary embolus  Allergies  Allergen Reactions  . Naproxen     Patient Measurements: Height: 5\' 8"  (172.7 cm) Weight: 138 lb (62.6 kg) IBW/kg (Calculated) : 68.4 Heparin Dosing Weight:   Vital Signs: Temp: 97.8 F (36.6 C) (05/19 2344) Temp Source: Oral (05/19 2344) BP: 136/70 (05/19 2300) Pulse Rate: 88 (05/19 2300)  Labs: Recent Labs    03/22/19 1559 03/22/19 2232 03/22/19 2252 03/23/19 0400 03/23/19 1224 03/23/19 2319  HGB 11.6*  --  10.9* 10.3*  --   --   HCT 37.4*  --  34.9* 32.8*  --   --   PLT 284  --  230 237  --   --   HEPARINUNFRC  --   --   --  0.68 0.74* 0.86*  CREATININE 1.06  --   --  0.73  --   --   TROPONINI 0.03* <0.03  --  <0.03 <0.03  --     Estimated Creatinine Clearance: 65.2 mL/min (by C-G formula based on SCr of 0.73 mg/dL).   Medications:  Infusions:  . heparin 1,050 Units/hr (03/23/19 1800)    Assessment: Patient with heparin level higher even after prior decrease in heparin level.  No heparin issues per RN.  Goal of Therapy:  Heparin level 0.3-0.7 units/ml Monitor platelets by anticoagulation protocol: Yes   Plan:  Decrease heparin to 900 units/hr Recheck level at 0900  Tyler Deis, Shea Stakes [place] 03/23/2019,11:54 PM

## 2019-03-24 ENCOUNTER — Telehealth: Payer: Self-pay | Admitting: Oncology

## 2019-03-24 DIAGNOSIS — D72829 Elevated white blood cell count, unspecified: Secondary | ICD-10-CM

## 2019-03-24 DIAGNOSIS — I2699 Other pulmonary embolism without acute cor pulmonale: Principal | ICD-10-CM

## 2019-03-24 DIAGNOSIS — C7951 Secondary malignant neoplasm of bone: Secondary | ICD-10-CM

## 2019-03-24 DIAGNOSIS — C61 Malignant neoplasm of prostate: Secondary | ICD-10-CM

## 2019-03-24 LAB — BASIC METABOLIC PANEL
Anion gap: 7 (ref 5–15)
BUN: 14 mg/dL (ref 8–23)
CO2: 27 mmol/L (ref 22–32)
Calcium: 8.6 mg/dL — ABNORMAL LOW (ref 8.9–10.3)
Chloride: 103 mmol/L (ref 98–111)
Creatinine, Ser: 0.64 mg/dL (ref 0.61–1.24)
GFR calc Af Amer: >60 mL/min (ref >60)
GFR calc non Af Amer: >60 mL/min (ref >60)
Glucose, Bld: 99 mg/dL (ref 70–99)
Potassium: 3.3 mmol/L — ABNORMAL LOW (ref 3.5–5.1)
Sodium: 137 mmol/L (ref 135–145)

## 2019-03-24 LAB — CBC
HCT: 34.8 % — ABNORMAL LOW (ref 39.0–52.0)
Hemoglobin: 10.4 g/dL — ABNORMAL LOW (ref 13.0–17.0)
MCH: 25.7 pg — ABNORMAL LOW (ref 26.0–34.0)
MCHC: 29.9 g/dL — ABNORMAL LOW (ref 30.0–36.0)
MCV: 85.9 fL (ref 80.0–100.0)
Platelets: 215 10*3/uL (ref 150–400)
RBC: 4.05 MIL/uL — ABNORMAL LOW (ref 4.22–5.81)
RDW: 20 % — ABNORMAL HIGH (ref 11.5–15.5)
WBC: 17.2 10*3/uL — ABNORMAL HIGH (ref 4.0–10.5)
nRBC: 0 % (ref 0.0–0.2)

## 2019-03-24 LAB — MAGNESIUM: Magnesium: 2.1 mg/dL (ref 1.7–2.4)

## 2019-03-24 LAB — GLUCOSE, CAPILLARY: Glucose-Capillary: 89 mg/dL (ref 70–99)

## 2019-03-24 MED ORDER — APIXABAN 5 MG PO TABS: 5.0000 mg | ORAL_TABLET | Freq: Two times a day (BID) | ORAL | 0 refills | Status: DC

## 2019-03-24 MED ORDER — APIXABAN 5 MG PO TABS: 5.0000 mg | ORAL_TABLET | Freq: Two times a day (BID) | ORAL | Status: DC

## 2019-03-24 MED ORDER — APIXABAN 5 MG PO TABS: 10.0000 mg | ORAL_TABLET | Freq: Two times a day (BID) | ORAL | 0 refills | Status: DC

## 2019-03-24 MED ORDER — APIXABAN 5 MG PO TABS
10.0000 mg | ORAL_TABLET | Freq: Two times a day (BID) | ORAL | Status: DC
  Administered 2019-03-24: 10 mg via ORAL
  Filled 2019-03-24: qty 2

## 2019-03-24 MED ORDER — HEPARIN SOD (PORK) LOCK FLUSH 100 UNIT/ML IV SOLN
500.0000 [IU] | INTRAVENOUS | Status: DC | PRN
  Administered 2019-03-24: 500 [IU]
  Filled 2019-03-24 (×2): qty 5

## 2019-03-24 NOTE — Discharge Summary (Signed)
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  Also, patient complained of shortness of breath on exertion.  Patient blood pressure was low 73/45 as per family.  Also reported palpitations and occasional chest pain. CTA chest in the ED showed bilateral pulmonary embolism, greater on the right.  Patient started on IV heparin. Currently he has remained hemodynamically stable.  Anticoagulation switched to Eliquis today.  He is medically stable for discharge to home today.  Following problems were addressed during his hospitalization:  1. Pulmonary embolism-CT chest showed bilateral pulmonary embolism, no heart strain noted on CT chest.  Bilateral venous duplex of lower extremities negative for DVT.  Echocardiogram showed normal left ventricular ejection fraction.He was initially started on IV heparin. Currently hemodynamically stable.  We will switch to Eliquis at the time of discharge.  2. Leukocytosis-secondary to growth factor support as per oncology.  Follow up as an outpatient.  3. 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  He has been on Diflucan in the past.  He was seen by ID on 02/17/2019 and got a refill for Diflucan.  Patient will need prolonged treatment for 6 to 12 months for cryptococcal pneumonitis as per ID.  4. Diabetes mellitus type 2-Continue home regimen on  discharge  5. CAD-stable,  continue beta-blocker,was on aspirin and  Plavix.  Patient's CABG was in 2005.  Discussed extensively with daughter on phone.  We will hold aspirin and Plavix because he has been started on Eliquis.  Given his age, he has has high risk developing bleeding if he is on combination of aspirin, Plavix and Eliquis.  I have discussed with daughter about the importance of follow-up with a cardiologist to check if he needs to be continued on this medicine in the future.    Discharge Diagnoses:  Active Problems:   Prostate cancer ([place])   Pulmonary embolus ([facility])   DM (diabetes mellitus), type 2 with complications ([facility])   Dehydration   CAD (coronary artery disease)   Leukocytosis   Pressure injury of skin    Discharge Instructions  Discharge Instructions    Diet - low sodium heart healthy   Complete by:  As directed    Discharge instructions   Complete by:  As directed    1)Follow up with a PCP in 1 to 2 weeks. 2)Follow up with cardiology as an outpatient in 2 weeks.  Aspirin and Plavix have been held because you have been started on a new anticoagulant called Eliquis.  Please follow-up with a cardiologist to check if you still need aspirin and  Plavix in the future.The combination of these medications can cause  bleeding. 3)Follow up with your  Oncologist. 4)Take prescribed medications as instructed.   Increase activity slowly   Complete by:  As directed      Allergies as of 03/24/2019      Reactions   Naproxen       Medication List    STOP taking these medications   aspirin EC 81 MG tablet  clopidogrel 75 MG tablet Commonly known as:  PLAVIX   lidocaine-prilocaine cream Commonly known as:  EMLA     TAKE these medications   acetaminophen 500 MG tablet Commonly known as:  TYLENOL Take 500 mg by mouth every 6 (six) hours as needed (2 tabs as needed for pain).   apixaban 5 MG Tabs tablet Commonly known as:  ELIQUIS Take 2 tablets (10 mg total)  by mouth 2 (two) times daily for 7 days.   apixaban 5 MG Tabs tablet Commonly known as:  ELIQUIS Take 1 tablet (5 mg total) by mouth 2 (two) times daily for 30 days. Start taking on:  Mar 31, 2019   fluconazole 200 MG tablet Commonly known as:  DIFLUCAN Take 1 tablet (200 mg total) by mouth 2 times daily at 12 noon and 4 pm. For 30 days   metFORMIN 1000 MG tablet Commonly known as:  GLUCOPHAGE Take 1,000 mg by mouth 2 (two) times daily with a meal.   metoprolol succinate 25 MG 24 hr tablet Commonly known as:  TOPROL-XL Take 25 mg by mouth daily.   nitroGLYCERIN 0.4 MG SL tablet Commonly known as:  NITROSTAT Place 0.4 mg under the tongue every 5 (five) minutes as needed for chest pain.   prochlorperazine 10 MG tablet Commonly known as:  COMPAZINE Take 1 tablet (10 mg total) by mouth every 6 (six) hours as needed for nausea or vomiting.   rosuvastatin 10 MG tablet Commonly known as:  CRESTOR Take 10 mg by mouth daily.   Toujeo SoloStar 300 UNIT/ML Sopn Generic drug:  Insulin Glargine (1 Unit Dial) Inject 15 Units into the skin 2 (two) times daily.       Allergies  Allergen Reactions  . Naproxen     Consultations:  Oncology   Procedures/Studies: Dg Chest 1 View  Result Date: 02/26/2019 CLINICAL DATA:  History of prostate cancer. EXAM: CHEST  1 VIEW COMPARISON:  CT scan of February 09, 2019. FINDINGS: The heart size and mediastinal contours are within normal limits. Right internal jugular Port-A-Cath is noted with distal tip in expected position of the SVC. No pneumothorax or pleural effusion is noted. Multiple rounded nodular densities are noted throughout both lungs concerning for possible metastatic disease. No consolidative process is noted. Multiple sclerotic densities are seen within the visualized skeleton consistent with osseous metastases. IMPRESSION: Right internal jugular Port-A-Cath in grossly good position. Multiple sclerotic lesions are noted throughout the  visualized skeleton consistent with osseous metastases. Multiple pulmonary nodules are again noted concerning for possible metastatic disease as well. Electronically Signed   By: Marijo Conception M.D.   On: 02/26/2019 11:11   Dg Chest 2 View  Result Date: 03/22/2019 CLINICAL DATA:  78 year old male with shortness of breath and fatigue EXAM: CHEST - 2 VIEW COMPARISON:  Chest x-ray 02/26/2019, CT 02/09/2019 FINDINGS: Cardiomediastinal silhouette unchanged in size and contour. Surgical changes of median sternotomy and CABG. Right IJ port catheter is unchanged. Sclerotic foci of the visualized appendicular and axial skeleton, similar to the comparison CT and plain film. No pneumothorax or pleural effusion.  No confluent airspace disease. Lung nodules are better characterized on recent CT imaging, not visualized on the current study. IMPRESSION: Negative for acute cardiopulmonary disease. Redemonstration of sclerotic metastases of the visualized axial and appendicular skeleton. Right IJ port catheter. Surgical changes of median sternotomy and CABG. Electronically Signed   By: Corrie Mckusick D.O.   On: 03/22/2019 16:11   Ct Angio Chest Pe W And/or  Wo Contrast  Result Date: 03/22/2019 CLINICAL DATA:  LEFT chest pain and shortness of breath since 1300 hours. Hyperglycemia and dizziness. EXAM: CT ANGIOGRAPHY CHEST WITH CONTRAST TECHNIQUE: Multidetector CT imaging of the chest was performed using the standard protocol during bolus administration of intravenous contrast. Multiplanar CT image reconstructions and MIPs were obtained to evaluate the vascular anatomy. CONTRAST:  24mL OMNIPAQUE IOHEXOL 350 MG/ML SOLN COMPARISON:  Chest x-ray earlier today. CT chest 02/09/2019 FINDINGS: Cardiovascular: Satisfactory opacification of the pulmonary arteries to the segmental level. BILATERAL pulmonary lower lobe emboli are present, more extensive on the RIGHT. No signs of RIGHT heart strain. Extensive three-vessel coronary artery  calcification. Median sternotomy for CABG. RIGHT chest port catheter. Aortic atherosclerosis. Normal heart size. No pericardial effusion. Mediastinum/Nodes: No enlarged mediastinal, hilar, or axillary lymph nodes. Thyroid gland, trachea, and esophagus demonstrate no significant findings. Lungs/Pleura: Redemonstrated are numerous small pulmonary nodules, most conspicuous in the LEFT upper lobe and superior segment RIGHT lower lobe, with some cavitation in the LEFT upper lobe. No pleural effusion or pneumothorax. These appear slightly improved compared with priors. Upper Abdomen: No acute abnormality. Musculoskeletal: Innumerable sclerotic osseous lesions, nonacute appearing RIGHT-sided rib fractures. Review of the MIP images confirms the above findings. IMPRESSION: 1. BILATERAL pulmonary emboli, greater on the RIGHT. 2. Widespread osseous metastatic disease, secondary to prostate cancer. 3. BILATERAL pulmonary nodules, appears slightly improved compared with priors. Atypical infection is favored. 4. These results were called by telephone at the time of interpretation on 03/22/2019 at 7:05 pm to Dr. Dorie Rank , who verbally acknowledged these results. Aortic Atherosclerosis (ICD10-I70.0). Electronically Signed   By: Staci Righter M.D.   On: 03/22/2019 19:10   Vas [place] Lower Extremity Venous (dvt)  Result Date: 03/23/2019  Lower Venous Study Indications: Pulmonary embolism.  Performing Technologist: Abram Sander RVS  Examination Guidelines: A complete evaluation includes B-mode imaging, spectral Doppler, color Doppler, and power Doppler as needed of all accessible portions of each vessel. Bilateral testing is considered an integral part of a complete examination. Limited examinations for reoccurring indications may be performed as noted.  +---------+---------------+---------+-----------+----------+-------+ RIGHT    CompressibilityPhasicitySpontaneityPropertiesSummary  +---------+---------------+---------+-----------+----------+-------+ CFV      Full           Yes      Yes                          +---------+---------------+---------+-----------+----------+-------+ SFJ      Full                                                 +---------+---------------+---------+-----------+----------+-------+ FV Prox  Full                                                 +---------+---------------+---------+-----------+----------+-------+ FV Mid   Full                                                 +---------+---------------+---------+-----------+----------+-------+ FV DistalFull                                                 +---------+---------------+---------+-----------+----------+-------+  PFV      Full                                                 +---------+---------------+---------+-----------+----------+-------+ POP      Full           Yes      Yes                          +---------+---------------+---------+-----------+----------+-------+ PTV      Full                                                 +---------+---------------+---------+-----------+----------+-------+ PERO     Full                                                 +---------+---------------+---------+-----------+----------+-------+   +---------+---------------+---------+-----------+----------+--------------+ LEFT     CompressibilityPhasicitySpontaneityPropertiesSummary        +---------+---------------+---------+-----------+----------+--------------+ CFV      Full           Yes      Yes                                 +---------+---------------+---------+-----------+----------+--------------+ SFJ      Full                                                        +---------+---------------+---------+-----------+----------+--------------+ FV Prox  Full                                                         +---------+---------------+---------+-----------+----------+--------------+ FV Mid   Full                                                        +---------+---------------+---------+-----------+----------+--------------+ FV DistalFull                                                        +---------+---------------+---------+-----------+----------+--------------+ PFV      Full                                                        +---------+---------------+---------+-----------+----------+--------------+ POP  Full           Yes      Yes                                 +---------+---------------+---------+-----------+----------+--------------+ PTV      Full                                                        +---------+---------------+---------+-----------+----------+--------------+ PERO                                                  Not visualized +---------+---------------+---------+-----------+----------+--------------+     Summary: Right: There is no evidence of deep vein thrombosis in the lower extremity. No cystic structure found in the popliteal fossa. Left: There is no evidence of deep vein thrombosis in the lower extremity. No cystic structure found in the popliteal fossa.  *See table(s) above for measurements and observations. Electronically signed by Harold Barban MD on 03/23/2019 at 2:51:14 PM.    Final        Subjective: Patient seen and examined the bedside this morning.  Remains comfortable.  Hemodynamically stable for discharge.  Discharge Exam: Vitals:   03/24/19 0750 03/24/19 0800  BP:  123/75  Pulse:  83  Resp:  15  Temp: 98.2 F (36.8 C)   SpO2:  98%   Vitals:   03/24/19 0600 03/24/19 0700 03/24/19 0750 03/24/19 0800  BP: 108/72 108/66  123/75  Pulse: 87 81  83  Resp: 10 13  15   Temp:   98.2 F (36.8 C)   TempSrc:   Oral   SpO2: 99% 98%  98%  Weight:      Height:        General: Pt is alert, awake, not in acute  distress Cardiovascular: RRR, S1/S2 +, no rubs, no gallops Respiratory: CTA bilaterally, no wheezing, no rhonchi Abdominal: Soft, NT, ND, bowel sounds + Extremities: no edema, no cyanosis    The results of significant diagnostics from this hospitalization (including imaging, microbiology, ancillary and laboratory) are listed below for reference.     Microbiology: Recent Results (from the past 240 hour(s))  SARS Coronavirus 2 (CEPHEID - Performed in [place] hospital lab), Hosp Order     Status: None   Collection Time: 03/22/19  4:40 PM  Result Value Ref Range Status   SARS Coronavirus 2 NEGATIVE NEGATIVE Final    Comment: (NOTE) If result is NEGATIVE SARS-CoV-2 target nucleic acids are NOT DETECTED. The SARS-CoV-2 RNA is generally detectable in upper and lower  respiratory specimens during the acute phase of infection. The lowest  concentration of SARS-CoV-2 viral copies this assay can detect is 250  copies / mL. A negative result does not preclude SARS-CoV-2 infection  and should not be used as the sole basis for treatment or other  patient management decisions.  A negative result may occur with  improper specimen collection / handling, submission of specimen other  than nasopharyngeal swab, presence of viral mutation(s) within the  areas targeted by this assay, and inadequate number of viral copies  (<250 copies / mL). A negative result  must be combined with clinical  observations, patient history, and epidemiological information. If result is POSITIVE SARS-CoV-2 target nucleic acids are DETECTED. The SARS-CoV-2 RNA is generally detectable in upper and lower  respiratory specimens dur ing the acute phase of infection.  Positive  results are indicative of active infection with SARS-CoV-2.  Clinical  correlation with patient history and other diagnostic information is  necessary to determine patient infection status.  Positive results do  not rule out bacterial infection or  co-infection with other viruses. If result is PRESUMPTIVE POSTIVE SARS-CoV-2 nucleic acids MAY BE PRESENT.   A presumptive positive result was obtained on the submitted specimen  and confirmed on repeat testing.  While 2019 novel coronavirus  (SARS-CoV-2) nucleic acids may be present in the submitted sample  additional confirmatory testing may be necessary for epidemiological  and / or clinical management purposes  to differentiate between  SARS-CoV-2 and other Sarbecovirus currently known to infect humans.  If clinically indicated additional testing with an alternate test  methodology [phone]) is advised. The SARS-CoV-2 RNA is generally  detectable in upper and lower respiratory sp ecimens during the acute  phase of infection. The expected result is Negative. Fact Sheet for Patients:  StrictlyIdeas.no Fact Sheet for Healthcare Providers: BankingDealers.co.za This test is not yet approved or cleared by the [place] FDA and has been authorized for detection and/or diagnosis of SARS-CoV-2 by FDA under an Emergency Use Authorization (EUA).  This EUA will remain in effect (meaning this test can be used) for the duration of the COVID-19 declaration under Section 564(b)(1) of the Act, 21 U.S.C. section 360bbb-3(b)(1), unless the authorization is terminated or revoked sooner. Performed at [facility], [place] [address], [place], [place] [address]   MRSA PCR Screening     Status: None   Collection Time: 03/22/19 10:36 PM  Result Value Ref Range Status   MRSA by PCR NEGATIVE NEGATIVE Final    Comment:        The GeneXpert MRSA Assay (FDA approved for NASAL specimens only), is one component of a comprehensive MRSA colonization surveillance program. It is not intended to diagnose MRSA infection nor to guide or monitor treatment for MRSA infections. Performed at [facility], [place] [address],  [place], [place] [address]      Labs: BNP (last 3 results) Recent Labs    03/22/19 1559  BNP 253.6*   Basic Metabolic Panel: Recent Labs  Lab 03/19/19 0912 03/22/19 1559 03/23/19 0400 03/24/19 0500  NA 137 136 137 137  K 3.9 4.2 3.6 3.3*  CL 102 99 104 103  CO2 23 21* 24 27  GLUCOSE 187* 238* 83 99  BUN 12 25* 19 14  CREATININE 0.94 1.06 0.73 0.64  CALCIUM 8.8* 8.9 8.2* 8.6*  MG  --   --  1.6* 2.1  PHOS  --   --  3.7  --    Liver Function Tests: Recent Labs  Lab 03/19/19 0912 03/23/19 0400  AST 87* 31  ALT 53* 33  ALKPHOS 90 83  BILITOT 0.2* 0.7  PROT 6.0* 5.5*  ALBUMIN 2.6* 2.7*   No results for input(s): LIPASE, AMYLASE in the last 168 hours. No results for input(s): AMMONIA in the last 168 hours. CBC: Recent Labs  Lab 03/19/19 0912 03/22/19 1559 03/22/19 2252 03/23/19 0400 03/24/19 0500  WBC 14.3* 53.6* 48.9* 40.3* 17.2*  NEUTROABS 9.9*  --  42.6*  --   --   HGB 11.2* 11.6* 10.9* 10.3*  10.4*  HCT 37.1* 37.4* 34.9* 32.8* 34.8*  MCV 88.3 87.8 86.0 87.0 85.9  PLT 367 284 230 237 215   Cardiac Enzymes: Recent Labs  Lab 03/22/19 1559 03/22/19 2232 03/23/19 0400 03/23/19 1224  TROPONINI 0.03* <0.03 <0.03 <0.03   BNP: Invalid input(s): POCBNP CBG: Recent Labs  Lab 03/23/19 0813 03/23/19 1146 03/23/19 1658 03/23/19 2204 03/24/19 0747  GLUCAP 80 173* 259* 157* 89   D-Dimer Recent Labs    03/22/19 1559  DDIMER 7.41*   Hgb A1c Recent Labs    03/23/19 0400  HGBA1C 8.9*   Lipid Profile No results for input(s): CHOL, HDL, LDLCALC, TRIG, CHOLHDL, LDLDIRECT in the last 72 hours. Thyroid function studies Recent Labs    03/23/19 0400  TSH 2.034   Anemia work up No results for input(s): VITAMINB12, FOLATE, FERRITIN, TIBC, IRON, RETICCTPCT in the last 72 hours. Urinalysis No results found for: COLORURINE, APPEARANCEUR, [place], [place], [place], [place], [place], [place], PROTEINUR, UROBILINOGEN, NITRITE, LEUKOCYTESUR Sepsis  Labs Invalid input(s): PROCALCITONIN,  WBC,  LACTICIDVEN Microbiology Recent Results (from the past 240 hour(s))  SARS Coronavirus 2 (CEPHEID - Performed in [place] hospital lab), Hosp Order     Status: None   Collection Time: 03/22/19  4:40 PM  Result Value Ref Range Status   SARS Coronavirus 2 NEGATIVE NEGATIVE Final    Comment: (NOTE) If result is NEGATIVE SARS-CoV-2 target nucleic acids are NOT DETECTED. The SARS-CoV-2 RNA is generally detectable in upper and lower  respiratory specimens during the acute phase of infection. The lowest  concentration of SARS-CoV-2 viral copies this assay can detect is 250  copies / mL. A negative result does not preclude SARS-CoV-2 infection  and should not be used as the sole basis for treatment or other  patient management decisions.  A negative result may occur with  improper specimen collection / handling, submission of specimen other  than nasopharyngeal swab, presence of viral mutation(s) within the  areas targeted by this assay, and inadequate number of viral copies  (<250 copies / mL). A negative result must be combined with clinical  observations, patient history, and epidemiological information. If result is POSITIVE SARS-CoV-2 target nucleic acids are DETECTED. The SARS-CoV-2 RNA is generally detectable in upper and lower  respiratory specimens dur ing the acute phase of infection.  Positive  results are indicative of active infection with SARS-CoV-2.  Clinical  correlation with patient history and other diagnostic information is  necessary to determine patient infection status.  Positive results do  not rule out bacterial infection or co-infection with other viruses. If result is PRESUMPTIVE POSTIVE SARS-CoV-2 nucleic acids MAY BE PRESENT.   A presumptive positive result was obtained on the submitted specimen  and confirmed on repeat testing.  While 2019 novel coronavirus  (SARS-CoV-2) nucleic acids may be present in the  submitted sample  additional confirmatory testing may be necessary for epidemiological  and / or clinical management purposes  to differentiate between  SARS-CoV-2 and other Sarbecovirus currently known to infect humans.  If clinically indicated additional testing with an alternate test  methodology [phone]) is advised. The SARS-CoV-2 RNA is generally  detectable in upper and lower respiratory sp ecimens during the acute  phase of infection. The expected result is Negative. Fact Sheet for Patients:  StrictlyIdeas.no Fact Sheet for Healthcare Providers: BankingDealers.co.za This test is not yet approved or cleared by the [place] FDA and has been authorized for detection and/or diagnosis of SARS-CoV-2 by FDA under an Emergency Use Authorization (  EUA).  This EUA will remain in effect (meaning this test can be used) for the duration of the COVID-19 declaration under Section 564(b)(1) of the Act, 21 U.S.C. section 360bbb-3(b)(1), unless the authorization is terminated or revoked sooner. Performed at [facility], [place] [address], [place], [place] [address]   MRSA PCR Screening     Status: None   Collection Time: 03/22/19 10:36 PM  Result Value Ref Range Status   MRSA by PCR NEGATIVE NEGATIVE Final    Comment:        The GeneXpert MRSA Assay (FDA approved for NASAL specimens only), is one component of a comprehensive MRSA colonization surveillance program. It is not intended to diagnose MRSA infection nor to guide or monitor treatment for MRSA infections. Performed at [facility], [place] [address] [place],  [address]     Please note: You were cared for by a hospitalist during your hospital stay. Once you are discharged, your primary care physician will handle any further medical issues. Please note that NO REFILLS for any discharge medications will be authorized once you are  discharged, as it is imperative that you return to your primary care physician (or establish a relationship with a primary care physician if you do not have one) for your post hospital discharge needs so that they can reassess your need for medications and monitor your lab values.    Time coordinating discharge: 40 minutes  SIGNED:   Shelly Coss, MD  [facility] 03/24/2019, 10:28 AM Pager [phone]  If 7PM-7AM, please contact night-coverage [email or web address] Password [email or web address]

## 2019-03-24 NOTE — Telephone Encounter (Signed)
Per response from FS re 5/20 schedule message, disregard the message. No appointments executed based on 5/20 schedule message.

## 2019-03-24 NOTE — Discharge Instructions (Signed)
Information on my medicine - ELIQUIS (apixaban)  This medication education was reviewed with me or my healthcare representative as part of my discharge preparation.  The pharmacist that spoke with me during my hospital stay was:   Why was Eliquis prescribed for you? Eliquis was prescribed to treat blood clots that may have been found in the veins of your legs (deep vein thrombosis) or in your lungs (pulmonary embolism) and to reduce the risk of them occurring again.  What do You need to know about Eliquis ? The starting dose is 10 mg (two 5 mg tablets) taken TWICE daily for the FIRST SEVEN (7) DAYS, then on 03/31/19  the dose is reduced to ONE 5 mg tablet taken TWICE daily.  Eliquis may be taken with or without food.   Try to take the dose about the same time in the morning and in the evening. If you have difficulty swallowing the tablet whole please discuss with your pharmacist how to take the medication safely.  Take Eliquis exactly as prescribed and DO NOT stop taking Eliquis without talking to the doctor who prescribed the medication.  Stopping may increase your risk of developing a new blood clot.  Refill your prescription before you run out.  After discharge, you should have regular check-up appointments with your healthcare provider that is prescribing your Eliquis.    What do you do if you miss a dose? If a dose of ELIQUIS is not taken at the scheduled time, take it as soon as possible on the same day and twice-daily administration should be resumed. The dose should not be doubled to make up for a missed dose.  Important Safety Information A possible side effect of Eliquis is bleeding. You should call your healthcare provider right away if you experience any of the following: ? Bleeding from an injury or your nose that does not stop. ? Unusual colored urine (red or dark brown) or unusual colored stools (red or black). ? Unusual bruising for unknown reasons. ? A serious fall  or if you hit your head (even if there is no bleeding).  Some medicines may interact with Eliquis and might increase your risk of bleeding or clotting while on Eliquis. To help avoid this, consult your healthcare provider or pharmacist prior to using any new prescription or non-prescription medications, including herbals, vitamins, non-steroidal anti-inflammatory drugs (NSAIDs) and supplements.  This website has more information on Eliquis (apixaban): http://www.eliquis.com/eliquis/home

## 2019-03-24 NOTE — Progress Notes (Signed)
[place] for apixaban Indication: pulmonary embolus  Allergies  Allergen Reactions  . Naproxen     Patient Measurements: Height: 5\' 8"  (172.7 cm) Weight: 138 lb (62.6 kg) IBW/kg (Calculated) : 68.4  Vital Signs: Temp: 98.2 F (36.8 C) (05/20 0750) Temp Source: Oral (05/20 0750) BP: 123/75 (05/20 0800) Pulse Rate: 83 (05/20 0800)  Labs: Recent Labs    03/22/19 1559 03/22/19 2232 03/22/19 2252 03/23/19 0400 03/23/19 1224 03/23/19 2319 03/24/19 0500  HGB 11.6*  --  10.9* 10.3*  --   --  10.4*  HCT 37.4*  --  34.9* 32.8*  --   --  34.8*  PLT 284  --  230 237  --   --  215  HEPARINUNFRC  --   --   --  0.68 0.74* 0.86*  --   CREATININE 1.06  --   --  0.73  --   --  0.64  TROPONINI 0.03* <0.03  --  <0.03 <0.03  --   --     Estimated Creatinine Clearance: 65.2 mL/min (by C-G formula based on SCr of 0.64 mg/dL).   No anticoagulants PTA  Assessment: 78 yo M with hx metastatic prostate cancer currently undergoing palliative chemotherapy & treatment for cryptococcal PNA who presents with chest pain.  Chest CT + new bilateral PE.   Last chemo 5/15- WBC elevated post- Neulasta.  Patient has been on heparin drip, pharmacy consulted to transition to apixaban for treatment of PE.  03/24/2019, 03/24/19  Hgb 10.4 - stable  Plt 215 - WNL  No bleeding issues reported  Plavix discontinued by MD   Plan:   Discontinue heparin infusion  Initiate apixaban at time of heparin discontinuation  Apixaban 10 mg PO BID x 7 days followed by 5 mg PO BID  Patient provided with manufacturer coupon and medication handout. Counseled on medication and side effects.  Lenis Noon, PharmD 03/24/19 10:17 AM

## 2019-03-24 NOTE — Progress Notes (Signed)
IP PROGRESS NOTE  Subjective:   No major complaints and noted overnight.  He is comfortable without any worsening symptoms of shortness of breath or difficulty breathing.  He denies any fevers or chills.  Still overall weak however.   Objective:  Vital signs in last 24 hours: Temp:  [97.8 F (36.6 C)-98.8 F (37.1 C)] 98.2 F (36.8 C) (05/20 0354) Pulse Rate:  [75-119] 87 (05/20 0600) Resp:  [10-27] 10 (05/20 0600) BP: (108-159)/(62-98) 108/72 (05/20 0600) SpO2:  [95 %-99 %] 99 % (05/20 0600) Weight change:  Last BM Date: 03/23/19  Intake/Output from previous day: 05/19 0701 - 05/20 0700 In: 612.3 [P.O.:240; I.V.:329.8; IV Piggyback:42.5] Out: 900 [Urine:900]    General appearance: Alert, awake without any distress. Head: Atraumatic without abnormalities Oropharynx: Without any thrush or ulcers. Eyes: No scleral icterus. Lymph nodes: No lymphadenopathy noted in the cervical, supraclavicular, or axillary nodes Heart:regular rate and rhythm, without any murmurs or gallops.   Lung: Clear to auscultation without any rhonchi, wheezes or dullness to percussion. Abdomin: Soft, nontender without any shifting dullness or ascites. Musculoskeletal: No clubbing or cyanosis. Neurological: No motor or sensory deficits. Skin: No rashes or lesions.    Lab Results: Recent Labs    03/23/19 0400 03/24/19 0500  WBC 40.3* 17.2*  HGB 10.3* 10.4*  HCT 32.8* 34.8*  PLT 237 215    BMET Recent Labs    03/23/19 0400 03/24/19 0500  NA 137 137  K 3.6 3.3*  CL 104 103  CO2 24 27  GLUCOSE 83 99  BUN 19 14  CREATININE 0.73 0.64  CALCIUM 8.2* 8.6*    Studies/Results: Dg Chest 2 View  Result Date: 03/22/2019 CLINICAL DATA:  78 year old male with shortness of breath and fatigue EXAM: CHEST - 2 VIEW COMPARISON:  Chest x-ray 02/26/2019, CT 02/09/2019 FINDINGS: Cardiomediastinal silhouette unchanged in size and contour. Surgical changes of median sternotomy and CABG. Right IJ port  catheter is unchanged. Sclerotic foci of the visualized appendicular and axial skeleton, similar to the comparison CT and plain film. No pneumothorax or pleural effusion.  No confluent airspace disease. Lung nodules are better characterized on recent CT imaging, not visualized on the current study. IMPRESSION: Negative for acute cardiopulmonary disease. Redemonstration of sclerotic metastases of the visualized axial and appendicular skeleton. Right IJ port catheter. Surgical changes of median sternotomy and CABG. Electronically Signed   By: Corrie Mckusick D.O.   On: 03/22/2019 16:11   Ct Angio Chest Pe W And/or Wo Contrast  Result Date: 03/22/2019 CLINICAL DATA:  LEFT chest pain and shortness of breath since 1300 hours. Hyperglycemia and dizziness. EXAM: CT ANGIOGRAPHY CHEST WITH CONTRAST TECHNIQUE: Multidetector CT imaging of the chest was performed using the standard protocol during bolus administration of intravenous contrast. Multiplanar CT image reconstructions and MIPs were obtained to evaluate the vascular anatomy. CONTRAST:  58mL OMNIPAQUE IOHEXOL 350 MG/ML SOLN COMPARISON:  Chest x-ray earlier today. CT chest 02/09/2019 FINDINGS: Cardiovascular: Satisfactory opacification of the pulmonary arteries to the segmental level. BILATERAL pulmonary lower lobe emboli are present, more extensive on the RIGHT. No signs of RIGHT heart strain. Extensive three-vessel coronary artery calcification. Median sternotomy for CABG. RIGHT chest port catheter. Aortic atherosclerosis. Normal heart size. No pericardial effusion. Mediastinum/Nodes: No enlarged mediastinal, hilar, or axillary lymph nodes. Thyroid gland, trachea, and esophagus demonstrate no significant findings. Lungs/Pleura: Redemonstrated are numerous small pulmonary nodules, most conspicuous in the LEFT upper lobe and superior segment RIGHT lower lobe, with some cavitation in the LEFT upper lobe.  No pleural effusion or pneumothorax. These appear slightly  improved compared with priors. Upper Abdomen: No acute abnormality. Musculoskeletal: Innumerable sclerotic osseous lesions, nonacute appearing RIGHT-sided rib fractures. Review of the MIP images confirms the above findings. IMPRESSION: 1. BILATERAL pulmonary emboli, greater on the RIGHT. 2. Widespread osseous metastatic disease, secondary to prostate cancer. 3. BILATERAL pulmonary nodules, appears slightly improved compared with priors. Atypical infection is favored. 4. These results were called by telephone at the time of interpretation on 03/22/2019 at 7:05 pm to Dr. Dorie Rank , who verbally acknowledged these results. Aortic Atherosclerosis (ICD10-I70.0). Electronically Signed   By: Staci Righter M.D.   On: 03/22/2019 19:10   Vas [place] Lower Extremity Venous (dvt)  Result Date: 03/23/2019  Lower Venous Study Indications: Pulmonary embolism.  Performing Technologist: Abram Sander RVS  Examination Guidelines: A complete evaluation includes B-mode imaging, spectral Doppler, color Doppler, and power Doppler as needed of all accessible portions of each vessel. Bilateral testing is considered an integral part of a complete examination. Limited examinations for reoccurring indications may be performed as noted.  +---------+---------------+---------+-----------+----------+-------+ RIGHT    CompressibilityPhasicitySpontaneityPropertiesSummary +---------+---------------+---------+-----------+----------+-------+ CFV      Full           Yes      Yes                          +---------+---------------+---------+-----------+----------+-------+ SFJ      Full                                                 +---------+---------------+---------+-----------+----------+-------+ FV Prox  Full                                                 +---------+---------------+---------+-----------+----------+-------+ FV Mid   Full                                                  +---------+---------------+---------+-----------+----------+-------+ FV DistalFull                                                 +---------+---------------+---------+-----------+----------+-------+ PFV      Full                                                 +---------+---------------+---------+-----------+----------+-------+ POP      Full           Yes      Yes                          +---------+---------------+---------+-----------+----------+-------+ PTV      Full                                                 +---------+---------------+---------+-----------+----------+-------+  PERO     Full                                                 +---------+---------------+---------+-----------+----------+-------+   +---------+---------------+---------+-----------+----------+--------------+ LEFT     CompressibilityPhasicitySpontaneityPropertiesSummary        +---------+---------------+---------+-----------+----------+--------------+ CFV      Full           Yes      Yes                                 +---------+---------------+---------+-----------+----------+--------------+ SFJ      Full                                                        +---------+---------------+---------+-----------+----------+--------------+ FV Prox  Full                                                        +---------+---------------+---------+-----------+----------+--------------+ FV Mid   Full                                                        +---------+---------------+---------+-----------+----------+--------------+ FV DistalFull                                                        +---------+---------------+---------+-----------+----------+--------------+ PFV      Full                                                        +---------+---------------+---------+-----------+----------+--------------+ POP      Full           Yes      Yes                                  +---------+---------------+---------+-----------+----------+--------------+ PTV      Full                                                        +---------+---------------+---------+-----------+----------+--------------+ PERO  Not visualized +---------+---------------+---------+-----------+----------+--------------+     Summary: Right: There is no evidence of deep vein thrombosis in the lower extremity. No cystic structure found in the popliteal fossa. Left: There is no evidence of deep vein thrombosis in the lower extremity. No cystic structure found in the popliteal fossa.  *See table(s) above for measurements and observations. Electronically signed by Harold Barban MD on 03/23/2019 at 2:51:14 PM.    Final     Medications: I have reviewed the patient's current medications.  Assessment/Plan:  78 year old with:  1.  Castration-resistant advanced prostate cancer with disease to the bone.  He status post 2 cycles of chemotherapy and currently on hold till he is recovered.  2.  Pulmonary embolism: He presented with bilateral involvement of the lung with symptoms of dyspnea.  He is currently on heparin but I have no objections to transitioning to Eliquis upon discharge.  His respiratory status appears to be stable at this time.  3.  Leukocytosis: Related to growth factor support only.  His white cell count appears to be declining appropriately.  4.  Disposition: He appears to be still weak for discharge.  We will arrange follow-up for him after he is discharged with likely chemotherapy to be delayed till he is fully recovered.  25  minutes was spent with the patient face-to-face today.  More than 50% of time was dedicated to reviewing laboratory data, imaging studies and discussing future treatment plan and complications.    LOS: 1 day   Zola Button 03/24/2019, 7:37 AM

## 2019-03-24 NOTE — TOC Transition Note (Signed)
Transition of Care [facility]) - CM/SW Discharge Note   Patient Details  Name: Michael Frey MRN: [id number] Date of Birth: 27-Apr-1941  [facility] CM/SW Contact:  Joaquin Courts, RN Phone Number: 03/24/2019, 10:43 AM   Clinical Narrative:    CM spoke to pt's pharmacy, copay for Eliquis 5mg  BID is 32$ for a month supply. Per pharmacy note free 30-day card was given for first month supply.     Barriers to Discharge: Continued Medical Work up   Patient Goals and CMS Choice        Discharge Placement                       Discharge Plan and Services   Discharge Planning Services: CM Consult                                 Social Determinants of Health (SDOH) Interventions     Readmission Risk Interventions Readmission Risk Prevention Plan 03/23/2019  Transportation Screening Complete  PCP or Specialist Appt within 3-5 Days Not Complete  Not Complete comments not yet ready for d/c   HRI or [place] Complete  Social Work Consult for [place] Planning/Counseling Not Complete  SW consult not completed comments not needed at this time  Palliative Care Screening Not Applicable  Medication Review [employer]) Complete

## 2019-03-28 ENCOUNTER — Encounter (HOSPITAL_COMMUNITY): Payer: Self-pay

## 2019-03-28 ENCOUNTER — Inpatient Hospital Stay (HOSPITAL_COMMUNITY)
Admission: EM | Admit: 2019-03-28 | Discharge: 2019-03-30 | DRG: 808 | Disposition: A | Discharge status: Home / Self Care | Payer: Medicare Other | Attending: Family Medicine | Admitting: Family Medicine

## 2019-03-28 ENCOUNTER — Emergency Department (HOSPITAL_COMMUNITY): Payer: Medicare Other

## 2019-03-28 ENCOUNTER — Other Ambulatory Visit: Payer: Self-pay

## 2019-03-28 DIAGNOSIS — Z20828 Contact with and (suspected) exposure to other viral communicable diseases: Secondary | ICD-10-CM | POA: Diagnosis present

## 2019-03-28 DIAGNOSIS — Z79899 Other long term (current) drug therapy: Secondary | ICD-10-CM | POA: Diagnosis not present

## 2019-03-28 DIAGNOSIS — Z794 Long term (current) use of insulin: Secondary | ICD-10-CM

## 2019-03-28 DIAGNOSIS — I2699 Other pulmonary embolism without acute cor pulmonale: Secondary | ICD-10-CM | POA: Diagnosis present

## 2019-03-28 DIAGNOSIS — B450 Pulmonary cryptococcosis: Secondary | ICD-10-CM | POA: Diagnosis present

## 2019-03-28 DIAGNOSIS — C7951 Secondary malignant neoplasm of bone: Secondary | ICD-10-CM | POA: Diagnosis present

## 2019-03-28 DIAGNOSIS — D709 Neutropenia, unspecified: Secondary | ICD-10-CM | POA: Diagnosis present

## 2019-03-28 DIAGNOSIS — R040 Epistaxis: Secondary | ICD-10-CM | POA: Diagnosis present

## 2019-03-28 DIAGNOSIS — Z7901 Long term (current) use of anticoagulants: Secondary | ICD-10-CM | POA: Diagnosis not present

## 2019-03-28 DIAGNOSIS — C61 Malignant neoplasm of prostate: Secondary | ICD-10-CM | POA: Diagnosis present

## 2019-03-28 DIAGNOSIS — I10 Essential (primary) hypertension: Secondary | ICD-10-CM | POA: Diagnosis present

## 2019-03-28 DIAGNOSIS — D62 Acute posthemorrhagic anemia: Secondary | ICD-10-CM | POA: Diagnosis present

## 2019-03-28 DIAGNOSIS — T451X5A Adverse effect of antineoplastic and immunosuppressive drugs, initial encounter: Secondary | ICD-10-CM | POA: Diagnosis present

## 2019-03-28 DIAGNOSIS — Y95 Nosocomial condition: Secondary | ICD-10-CM | POA: Diagnosis present

## 2019-03-28 DIAGNOSIS — D701 Agranulocytosis secondary to cancer chemotherapy: Secondary | ICD-10-CM | POA: Diagnosis present

## 2019-03-28 DIAGNOSIS — E1165 Type 2 diabetes mellitus with hyperglycemia: Secondary | ICD-10-CM | POA: Diagnosis present

## 2019-03-28 DIAGNOSIS — Z9081 Acquired absence of spleen: Secondary | ICD-10-CM | POA: Diagnosis not present

## 2019-03-28 DIAGNOSIS — D6481 Anemia due to antineoplastic chemotherapy: Secondary | ICD-10-CM | POA: Diagnosis present

## 2019-03-28 DIAGNOSIS — K921 Melena: Secondary | ICD-10-CM | POA: Diagnosis present

## 2019-03-28 DIAGNOSIS — E118 Type 2 diabetes mellitus with unspecified complications: Secondary | ICD-10-CM | POA: Diagnosis not present

## 2019-03-28 DIAGNOSIS — Z886 Allergy status to analgesic agent status: Secondary | ICD-10-CM

## 2019-03-28 DIAGNOSIS — J189 Pneumonia, unspecified organism: Secondary | ICD-10-CM | POA: Diagnosis present

## 2019-03-28 DIAGNOSIS — Z9049 Acquired absence of other specified parts of digestive tract: Secondary | ICD-10-CM | POA: Diagnosis not present

## 2019-03-28 DIAGNOSIS — L89302 Pressure ulcer of unspecified buttock, stage 2: Secondary | ICD-10-CM | POA: Diagnosis present

## 2019-03-28 DIAGNOSIS — I2510 Atherosclerotic heart disease of native coronary artery without angina pectoris: Secondary | ICD-10-CM | POA: Diagnosis present

## 2019-03-28 DIAGNOSIS — Z95828 Presence of other vascular implants and grafts: Secondary | ICD-10-CM | POA: Diagnosis not present

## 2019-03-28 LAB — CBC WITH DIFFERENTIAL/PLATELET
Abs Immature Granulocytes: 0.01 10*3/uL (ref 0.00–0.07)
Basophils Absolute: 0 10*3/uL (ref 0.0–0.1)
Basophils Relative: 2 %
Eosinophils Absolute: 0 10*3/uL (ref 0.0–0.5)
Eosinophils Relative: 1 %
HCT: 29 % — ABNORMAL LOW (ref 39.0–52.0)
Hemoglobin: 8.9 g/dL — ABNORMAL LOW (ref 13.0–17.0)
Immature Granulocytes: 1 %
Lymphocytes Relative: 45 %
Lymphs Abs: 0.6 10*3/uL — ABNORMAL LOW (ref 0.7–4.0)
MCH: 26.6 pg (ref 26.0–34.0)
MCHC: 30.7 g/dL (ref 30.0–36.0)
MCV: 86.6 fL (ref 80.0–100.0)
Monocytes Absolute: 0.2 10*3/uL (ref 0.1–1.0)
Monocytes Relative: 15 %
Neutro Abs: 0.5 10*3/uL — ABNORMAL LOW (ref 1.7–7.7)
Neutrophils Relative %: 36 %
Platelets: 236 10*3/uL (ref 150–400)
RBC: 3.35 MIL/uL — ABNORMAL LOW (ref 4.22–5.81)
RDW: 19.9 % — ABNORMAL HIGH (ref 11.5–15.5)
WBC: 1.3 10*3/uL — CL (ref 4.0–10.5)
nRBC: 0 % (ref 0.0–0.2)

## 2019-03-28 LAB — LACTIC ACID, PLASMA: Lactic Acid, Venous: 1.4 mmol/L (ref 0.5–1.9)

## 2019-03-28 LAB — SARS CORONAVIRUS 2 BY RT PCR (HOSPITAL ORDER, PERFORMED IN ~~LOC~~ HOSPITAL LAB): SARS Coronavirus 2: NEGATIVE

## 2019-03-28 LAB — TYPE AND SCREEN
ABO/RH(D): A POS
Antibody Screen: NEGATIVE

## 2019-03-28 LAB — COMPREHENSIVE METABOLIC PANEL
ALT: 15 U/L (ref 0–44)
AST: 16 U/L (ref 15–41)
Albumin: 2.4 g/dL — ABNORMAL LOW (ref 3.5–5.0)
Alkaline Phosphatase: 56 U/L (ref 38–126)
Anion gap: 5 (ref 5–15)
BUN: 9 mg/dL (ref 8–23)
CO2: 21 mmol/L — ABNORMAL LOW (ref 22–32)
Calcium: 7.6 mg/dL — ABNORMAL LOW (ref 8.9–10.3)
Chloride: 111 mmol/L (ref 98–111)
Creatinine, Ser: 0.73 mg/dL (ref 0.61–1.24)
GFR calc Af Amer: >60 mL/min (ref >60)
GFR calc non Af Amer: >60 mL/min (ref >60)
Glucose, Bld: 210 mg/dL — ABNORMAL HIGH (ref 70–99)
Potassium: 3.4 mmol/L — ABNORMAL LOW (ref 3.5–5.1)
Sodium: 137 mmol/L (ref 135–145)
Total Bilirubin: 0.4 mg/dL (ref 0.3–1.2)
Total Protein: 5.5 g/dL — ABNORMAL LOW (ref 6.5–8.1)

## 2019-03-28 LAB — GLUCOSE, CAPILLARY: Glucose-Capillary: 130 mg/dL — ABNORMAL HIGH (ref 70–99)

## 2019-03-28 LAB — PROTIME-INR
INR: 1.9 — ABNORMAL HIGH (ref 0.8–1.2)
Prothrombin Time: 21.4 seconds — ABNORMAL HIGH (ref 11.4–15.2)

## 2019-03-28 LAB — POC OCCULT BLOOD, ED: Fecal Occult Bld: NEGATIVE

## 2019-03-28 MED ORDER — SODIUM CHLORIDE 0.9% FLUSH: 3.0000 mL | INTRAVENOUS | Status: DC | PRN

## 2019-03-28 MED ORDER — INSULIN ASPART 100 UNIT/ML ~~LOC~~ SOLN
0.0000 [IU] | Freq: Three times a day (TID) | SUBCUTANEOUS | Status: DC
  Administered 2019-03-29 (×2): 2 [IU] via SUBCUTANEOUS
  Administered 2019-03-30: 08:00:00 1 [IU] via SUBCUTANEOUS
  Administered 2019-03-30: 12:00:00 2 [IU] via SUBCUTANEOUS

## 2019-03-28 MED ORDER — VANCOMYCIN HCL 10 G IV SOLR: 1250.0000 mg | INTRAVENOUS | Status: DC

## 2019-03-28 MED ORDER — SILVER NITRATE-POT NITRATE 75-25 % EX MISC: 2.0000 | Freq: Once | CUTANEOUS | Status: DC

## 2019-03-28 MED ORDER — SODIUM CHLORIDE 0.9 % IV BOLUS: 500.0000 mL | Freq: Once | INTRAVENOUS | Status: DC

## 2019-03-28 MED ORDER — SODIUM CHLORIDE 0.9 % IV SOLN: 1.0000 g | Freq: Three times a day (TID) | INTRAVENOUS | Status: DC

## 2019-03-28 MED ORDER — SODIUM CHLORIDE 0.9 % IV SOLN
250.0000 mL | INTRAVENOUS | Status: DC | PRN
  Administered 2019-03-29: 05:00:00 250 mL via INTRAVENOUS

## 2019-03-28 MED ORDER — SODIUM CHLORIDE 0.9 % IV BOLUS
1000.0000 mL | Freq: Once | INTRAVENOUS | Status: AC
  Administered 2019-03-28: 20:00:00 1000 mL via INTRAVENOUS

## 2019-03-28 MED ORDER — SODIUM CHLORIDE 0.9 % IV SOLN
2.0000 g | Freq: Once | INTRAVENOUS | Status: AC
  Administered 2019-03-28: 20:00:00 2 g via INTRAVENOUS
  Filled 2019-03-28: qty 2

## 2019-03-28 MED ORDER — METOPROLOL SUCCINATE ER 25 MG PO TB24
25.0000 mg | ORAL_TABLET | Freq: Every day | ORAL | Status: DC
  Administered 2019-03-28 – 2019-03-30 (×3): 25 mg via ORAL
  Filled 2019-03-28 (×3): qty 1

## 2019-03-28 MED ORDER — SODIUM CHLORIDE 0.9 % IV SOLN
2.0000 g | Freq: Three times a day (TID) | INTRAVENOUS | Status: DC
  Administered 2019-03-29: 06:00:00 2 g via INTRAVENOUS
  Filled 2019-03-28 (×2): qty 2

## 2019-03-28 MED ORDER — INSULIN GLARGINE 100 UNIT/ML ~~LOC~~ SOLN
15.0000 [IU] | Freq: Two times a day (BID) | SUBCUTANEOUS | Status: DC
  Administered 2019-03-29 – 2019-03-30 (×3): 15 [IU] via SUBCUTANEOUS
  Filled 2019-03-28 (×5): qty 0.15

## 2019-03-28 MED ORDER — PROCHLORPERAZINE MALEATE 10 MG PO TABS
10.0000 mg | ORAL_TABLET | Freq: Four times a day (QID) | ORAL | Status: DC | PRN
  Filled 2019-03-28: qty 1

## 2019-03-28 MED ORDER — SODIUM CHLORIDE 0.9 % IV SOLN
Freq: Once | INTRAVENOUS | Status: AC
  Administered 2019-03-28: 20:00:00 via INTRAVENOUS

## 2019-03-28 MED ORDER — VANCOMYCIN HCL 10 G IV SOLR
1250.0000 mg | Freq: Once | INTRAVENOUS | Status: AC
  Administered 2019-03-28: 21:00:00 1250 mg via INTRAVENOUS
  Filled 2019-03-28: qty 1250

## 2019-03-28 MED ORDER — SODIUM CHLORIDE 0.9% FLUSH
3.0000 mL | Freq: Two times a day (BID) | INTRAVENOUS | Status: DC
  Administered 2019-03-29 (×3): 3 mL via INTRAVENOUS

## 2019-03-28 NOTE — Progress Notes (Signed)
Pharmacy Antibiotic Note  Pacey Altizer is a 78 y.o. male admitted on 03/28/2019 with pneumonia.  Pharmacy has been consulted for Vancomycin, cefepime dosing.  Plan: Vancomycin 1250mg  x 1, then Vancomycin 1250 mg IV Q 24 hrs. Goal AUC 400-550. Expected AUC: 531 SCr used: 0.8 (adjusted)  Cefepime 2 gm x1, then 2gm iv q8hr   Height: 5\' 8"  (172.7 cm) Weight: 130 lb (59 kg) IBW/kg (Calculated) : 68.4  Temp (24hrs), Avg:98.3 F (36.8 C), Min:98 F (36.7 C), Max:98.5 F (36.9 C)  Recent Labs  Lab 03/22/19 1559 03/22/19 2252 03/23/19 0400 03/24/19 0500 03/28/19 1640 03/28/19 1926  WBC 53.6* 48.9* 40.3* 17.2* 1.3*  --   CREATININE 1.06  --  0.73 0.64 0.73  --   LATICACIDVEN  --   --   --   --   --  1.4    Estimated Creatinine Clearance: 61.5 mL/min (by C-G formula based on SCr of 0.73 mg/dL).    Allergies  Allergen Reactions  . Naproxen     Antimicrobials this admission: Vancomycin 03/28/2019 >> Cefepime 03/28/2019 >>   Dose adjustments this admission: -  Microbiology results: -  Thank you for allowing pharmacy to be a part of this patient's care.  Nani Skillern Crowford 03/28/2019 9:33 PM

## 2019-03-28 NOTE — ED Notes (Signed)
Date and time results received: 03/28/19 1859 (use smartphrase ".now" to insert current time)  Test: WBC Critical Value: 1.3  Name of Provider Notified: Primary RN Notified  Orders Received? Or Actions Taken?: Actions Taken: Engineer, structural

## 2019-03-28 NOTE — ED Notes (Signed)
Report given to Randall, RN. 

## 2019-03-28 NOTE — ED Notes (Signed)
ED TO INPATIENT HANDOFF REPORT  ED Nurse Name and Phone #: [place] G, [phone]  S Name/Age/Gender Michael Frey 78 y.o. male Room/Bed: WA14/WA14  Code Status   Code Status: Full Code  Home/SNF/Other Home Patient oriented to: self, place, time and situation Is this baseline? Yes   Triage Complete: Triage complete  Chief Complaint Emesis with Blood and Diarrhea     Triage Note He tells me that, this morning "I threw up, had diarrhea and a nosebleed at the same time" [sic]. He states the epistaxis was brief, and that the color of his stool was "black". He tells me that his emesis "looked like food". He is in no pain and in no distress.   Allergies Allergies  Allergen Reactions  . Naproxen     Level of Care/Admitting Diagnosis ED Disposition    ED Disposition Condition Comment   Admit  Hospital Area: [place] [[id number]]  Level of Care: Med-Surg [16]  Covid Evaluation: N/A  Diagnosis: PNA (pneumonia) [671245]  Admitting Physician: Phillips Grout [[id number]]  Attending Physician: Derrill Kay A [[id number]]  Estimated length of stay: 3 - 4 days  Certification:: I certify this patient will need inpatient services for at least 2 midnights  PT Class (Do Not Modify): Inpatient [101]  PT Acc Code (Do Not Modify): Private [1]       B Medical/Surgery History Past Medical History:  Diagnosis Date  . Cancer ([place])   . Diabetes mellitus without complication [facility])    Past Surgical History:  Procedure Laterality Date  . CHOLECYSTECTOMY    . paratial pancrectomy    . PORTACATH PLACEMENT    . SPLENECTOMY       A IV Location/Drains/Wounds Patient Lines/Drains/Airways Status   Active Line/Drains/Airways    Name:   Placement date:   Placement time:   Site:   Days:   Implanted Port Right Chest   -    -    Chest      Peripheral IV 03/28/19 Right;Posterior Forearm   03/28/19    1525    Forearm   less than 1          Intake/Output Last 24  hours  Intake/Output Summary (Last 24 hours) at 03/28/2019 2125 Last data filed at 03/28/2019 2023 Gross per 24 hour  Intake 700 ml  Output -  Net 700 ml    Labs/Imaging Results for orders placed or performed during the hospital encounter of 03/28/19 (from the past 48 hour(s))  CBC with Differential     Status: Abnormal   Collection Time: 03/28/19  4:40 PM  Result Value Ref Range   WBC 1.3 (LL) 4.0 - 10.5 K/uL    Comment: This critical result has verified and been called to CLAPP,S by POTEAT,SHANNON on 05 24 2020 at [place], and has been read back. CRITICAL RESULT VERIFIED   RBC 3.35 (L) 4.22 - 5.81 MIL/uL   Hemoglobin 8.9 (L) 13.0 - 17.0 g/dL   HCT 29.0 (L) 39.0 - 52.0 %   MCV 86.6 80.0 - 100.0 fL   MCH 26.6 26.0 - 34.0 pg   MCHC 30.7 30.0 - 36.0 g/dL   RDW 19.9 (H) 11.5 - 15.5 %   Platelets 236 150 - 400 K/uL   nRBC 0.0 0.0 - 0.2 %   Neutrophils Relative % 36 %   Neutro Abs 0.5 (L) 1.7 - 7.7 K/uL   Lymphocytes Relative 45 %   Lymphs Abs 0.6 (L) 0.7 - 4.0 K/uL  Monocytes Relative 15 %   Monocytes Absolute 0.2 0.1 - 1.0 K/uL   Eosinophils Relative 1 %   Eosinophils Absolute 0.0 0.0 - 0.5 K/uL   Basophils Relative 2 %   Basophils Absolute 0.0 0.0 - 0.1 K/uL   WBC Morphology DOHLE BODIES     Comment: MILD LEFT SHIFT (1-5% METAS, OCC MYELO, OCC BANDS)   Immature Granulocytes 1 %   Abs Immature Granulocytes 0.01 0.00 - 0.07 K/uL    Comment: Performed at [facility], [place] [address], [place], [place] [address]  Comprehensive metabolic panel     Status: Abnormal   Collection Time: 03/28/19  4:40 PM  Result Value Ref Range   Sodium 137 135 - 145 mmol/L   Potassium 3.4 (L) 3.5 - 5.1 mmol/L   Chloride 111 98 - 111 mmol/L   CO2 21 (L) 22 - 32 mmol/L   Glucose, Bld 210 (H) 70 - 99 mg/dL   BUN 9 8 - 23 mg/dL   Creatinine, Ser 0.73 0.61 - 1.24 mg/dL   Calcium 7.6 (L) 8.9 - 10.3 mg/dL   Total Protein 5.5 (L) 6.5 - 8.1 g/dL   Albumin 2.4 (L) 3.5 - 5.0 g/dL   AST  16 15 - 41 U/L   ALT 15 0 - 44 U/L   Alkaline Phosphatase 56 38 - 126 U/L   Total Bilirubin 0.4 0.3 - 1.2 mg/dL   GFR calc non Af Amer >60 >60 mL/min   GFR calc Af Amer >60 >60 mL/min   Anion gap 5 5 - 15    Comment: Performed at [facility], [place] [address] [place], [place] [address]  Protime-INR     Status: Abnormal   Collection Time: 03/28/19  4:40 PM  Result Value Ref Range   Prothrombin Time 21.4 (H) 11.4 - 15.2 seconds   INR 1.9 (H) 0.8 - 1.2    Comment: (NOTE) INR goal varies based on device and disease states. Performed at [facility]: [place], [place] [address] [place], [place] [address]   Type and screen [place]     Status: None   Collection Time: 03/28/19  4:40 PM  Result Value Ref Range   ABO/RH(D) A POS    Antibody Screen NEG    Sample Expiration      03/31/2019,2359 Performed at [facility], [place] [address], [place],  [address]   SARS Coronavirus 2 (CEPHEID - Performed in [place] hospital lab), Hosp Order     Status: None   Collection Time: 03/28/19  4:55 PM  Result Value Ref Range   SARS Coronavirus 2 NEGATIVE NEGATIVE    Comment: (NOTE) If result is NEGATIVE SARS-CoV-2 target nucleic acids are NOT DETECTED. The SARS-CoV-2 RNA is generally detectable in upper and lower  respiratory specimens during the acute phase of infection. The lowest  concentration of SARS-CoV-2 viral copies this assay can detect is 250  copies / mL. A negative result does not preclude SARS-CoV-2 infection  and should not be used as the sole basis for treatment or other  patient management decisions.  A negative result may occur with  improper specimen collection / handling, submission of specimen other  than nasopharyngeal swab, presence of viral mutation(s) within the  areas targeted by this assay, and inadequate number of viral copies  (<250 copies / mL). A negative result must be combined with clinical   observations, patient history, and epidemiological information. If result is POSITIVE  SARS-CoV-2 target nucleic acids are DETECTED. The SARS-CoV-2 RNA is generally detectable in upper and lower  respiratory specimens dur ing the acute phase of infection.  Positive  results are indicative of active infection with SARS-CoV-2.  Clinical  correlation with patient history and other diagnostic information is  necessary to determine patient infection status.  Positive results do  not rule out bacterial infection or co-infection with other viruses. If result is PRESUMPTIVE POSTIVE SARS-CoV-2 nucleic acids MAY BE PRESENT.   A presumptive positive result was obtained on the submitted specimen  and confirmed on repeat testing.  While 2019 novel coronavirus  (SARS-CoV-2) nucleic acids may be present in the submitted sample  additional confirmatory testing may be necessary for epidemiological  and / or clinical management purposes  to differentiate between  SARS-CoV-2 and other Sarbecovirus currently known to infect humans.  If clinically indicated additional testing with an alternate test  methodology [phone]) is advised. The SARS-CoV-2 RNA is generally  detectable in upper and lower respiratory sp ecimens during the acute  phase of infection. The expected result is Negative. Fact Sheet for Patients:  StrictlyIdeas.no Fact Sheet for Healthcare Providers: BankingDealers.co.za This test is not yet approved or cleared by the [place] FDA and has been authorized for detection and/or diagnosis of SARS-CoV-2 by FDA under an Emergency Use Authorization (EUA).  This EUA will remain in effect (meaning this test can be used) for the duration of the COVID-19 declaration under Section 564(b)(1) of the Act, 21 U.S.C. section 360bbb-3(b)(1), unless the authorization is terminated or revoked sooner. Performed at [facility], [place]  [address] [place], [place] [address]   POC occult blood, ED RN will collect     Status: None   Collection Time: 03/28/19  5:35 PM  Result Value Ref Range   Fecal Occult Bld NEGATIVE NEGATIVE  Lactic acid, plasma     Status: None   Collection Time: 03/28/19  7:26 PM  Result Value Ref Range   Lactic Acid, Venous 1.4 0.5 - 1.9 mmol/L    Comment: Performed at [facility], [place] [address], [place], [place] [address]   Dg Chest Portable 1 View  Result Date: 03/28/2019 CLINICAL DATA:  Shortness of breath. Emesis, diarrhea, and epistaxis. EXAM: PORTABLE CHEST 1 VIEW COMPARISON:  Chest radiographs and CTA 03/22/2019 FINDINGS: A right jugular Port-A-Cath terminates over the lower SVC, unchanged. The cardiomediastinal silhouette is unchanged with normal heart size. 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 Left upper lobe nodular densities on CT are partially obscured radiographically due to innumerable sclerotic bone metastases including involvement of the ribs and scapulae which result in bone lesions projecting over the lungs. Mildly increased patchy airspace opacity is questioned in the left mid lung. No sizable pleural effusion or pneumothorax is identified. IMPRESSION: 1. Questionable new/increased infiltrate in the left midlung. 2. Diffuse sclerotic bone metastases. Electronically Signed   By: Logan Bores M.D.   On: 03/28/2019 18:12    Pending Labs Unresulted Labs (From admission, onward)    Start     Ordered   03/29/19 3614  Basic metabolic panel  Tomorrow morning,   R     03/28/19 2057   03/29/19 0500  CBC WITH DIFFERENTIAL  Tomorrow morning,   R     03/28/19 2057   03/28/19 2057  Legionella Pneumophila Serogp 1 Ur Ag  Once,   R     03/28/19 2057   03/28/19 2055  Culture, blood (routine x 2) Call MD  if unable to obtain prior to antibiotics being given  BLOOD CULTURE X 2,   R    Comments:  If blood cultures drawn in Emergency Department - Do not draw and cancel order     03/28/19 2057   03/28/19 2055  Culture, sputum-assessment  Once,   R     03/28/19 2057   03/28/19 2055  Gram stain  Once,   R     03/28/19 2057   03/28/19 2055  Strep pneumoniae urinary antigen  Once,   R     03/28/19 2057   03/28/19 1846  Blood culture (routine x 2)  BLOOD CULTURE X 2,   STAT     03/28/19 1845          Vitals/Pain Today's Vitals   03/28/19 2015 03/28/19 2023 03/28/19 2030 03/28/19 2100  BP:   115/60 112/64  Pulse:   (!) 103 (!) 101  Resp:   17 14  Temp: 98.5 F (36.9 C)     TempSrc: Oral     SpO2:   98% 98%  Weight:  59 kg    Height:  5\' 8"  (1.727 m)      Isolation Precautions Protective Precautions  Medications Medications  vancomycin (VANCOCIN) 1,250 mg in sodium chloride 0.9 % 250 mL IVPB (1,250 mg Intravenous New Bag/Given 03/28/19 2034)  metoprolol succinate (TOPROL-XL) 24 hr tablet 25 mg (has no administration in time range)  prochlorperazine (COMPAZINE) tablet 10 mg (has no administration in time range)  Insulin Glargine (1 Unit Dial) SOPN 15 Units (has no administration in time range)  sodium chloride flush (NS) 0.9 % injection 3 mL (has no administration in time range)  sodium chloride flush (NS) 0.9 % injection 3 mL (has no administration in time range)  0.9 %  sodium chloride infusion (has no administration in time range)  insulin aspart (novoLOG) injection 0-9 Units (has no administration in time range)  0.9 %  sodium chloride infusion ( Intravenous New Bag/Given 03/28/19 1944)  sodium chloride 0.9 % bolus 1,000 mL (1,000 mLs Intravenous New Bag/Given 03/28/19 1943)  ceFEPIme (MAXIPIME) 2 g in sodium chloride 0.9 % 100 mL IVPB (0 g Intravenous Stopped 03/28/19 2023)    Mobility walks Low fall risk

## 2019-03-28 NOTE — ED Triage Notes (Signed)
He tells me that, this morning "I threw up, had diarrhea and a nosebleed at the same time" [sic]. He states the epistaxis was brief, and that the color of his stool was "black". He tells me that his emesis "looked like food". He is in no pain and in no distress.

## 2019-03-28 NOTE — ED Notes (Signed)
Bed: MB01 Expected date:  Expected time:  Means of arrival:  Comments: Hematemesis

## 2019-03-28 NOTE — ED Provider Notes (Signed)
[place] DEPT Provider Note   CSN: [id number] Arrival date & time: 03/28/19  1531    History   Chief Complaint Chief Complaint  Patient presents with  . Emesis  . Melena    HPI Michael Frey is a 78 y.o. male.     HPI   78 yo m with PMHx CAD, prostate CA, recent PE here with weakness, epistaxis, black stools. Pt was just hospitalized for acute PE, started on Eliquis and taken off plavix. He reports that since discharge, he has been feeling generally weak and tired. He's having difficulty even getting dressed 2/2 extreme fatigue. He's had SOB with exertion that is now present at rest as well. He notes that over the past few days, his stool has been black/tarry and he woke up this AM with mild nausea and one episode of epistaxis from the right nostril. He states he felt weaker than he has so he presents for evaluation. Denies any fever, chills. No hemoptysis. Sx worse with any exertion, no alleviating factors.  Past Medical History:  Diagnosis Date  . Cancer ([place])   . Diabetes mellitus without complication [facility])     Patient Active Problem List   Diagnosis Date Noted  . Pressure injury of skin 03/23/2019  . Pulmonary embolus ([place]) 03/22/2019  . DM (diabetes mellitus), type 2 with complications ([place]) 81/11/7508  . Dehydration 03/22/2019  . CAD (coronary artery disease) 03/22/2019  . Leukocytosis 03/22/2019  . Port-A-Cath in place 03/19/2019  . 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 Prostate cancer ([place]) 02/04/2019    Past Surgical History:  Procedure Laterality Date  . CHOLECYSTECTOMY    . paratial pancrectomy    . PORTACATH PLACEMENT    . SPLENECTOMY          Home Medications    Prior to Admission medications   Medication Sig Start Date End Date Taking? Authorizing Provider  acetaminophen (TYLENOL) 500 MG tablet Take 500 mg by mouth every 6 (six) hours as needed (2 tabs as needed for pain).    [provider]   apixaban (ELIQUIS) 5 MG TABS tablet Take 2 tablets (10 mg total) by mouth 2 (two) times daily for 7 days. 03/24/19 03/31/19  Shelly Coss, MD  apixaban (ELIQUIS) 5 MG TABS tablet Take 1 tablet (5 mg total) by mouth 2 (two) times daily for 30 days. 03/31/19 04/30/19  Shelly Coss, MD  fluconazole (DIFLUCAN) 200 MG tablet Take 1 tablet (200 mg total) by mouth 2 times daily at 12 noon and 4 pm. For 30 days 02/17/19   Thayer Headings, MD  Insulin Glargine, 1 Unit Dial, (TOUJEO SOLOSTAR) 300 UNIT/ML SOPN Inject 15 Units into the skin 2 (two) times daily.    [provider]  metFORMIN (GLUCOPHAGE) 1000 MG tablet Take 1,000 mg by mouth 2 (two) times daily with a meal.    [provider]  metoprolol succinate (TOPROL-XL) 25 MG 24 hr tablet Take 25 mg by mouth daily.    [provider]  nitroGLYCERIN (NITROSTAT) 0.4 MG SL tablet Place 0.4 mg under the tongue every 5 (five) minutes as needed for chest pain.    [provider]  prochlorperazine (COMPAZINE) 10 MG tablet Take 1 tablet (10 mg total) by mouth every 6 (six) hours as needed for nausea or vomiting. 02/10/19   Wyatt Portela, MD  rosuvastatin (CRESTOR) 10 MG tablet Take 10 mg by mouth daily.    [provider]    Family History  Family History  Problem Relation Age of Onset  . Stroke Father   . Hypertension Other     Social History Social History   Tobacco Use  . Smoking status: Never Smoker  . Smokeless tobacco: Never Used  Substance Use Topics  . Alcohol use: Never    Frequency: Never  . Drug use: Never     Allergies   Naproxen   Review of Systems Review of Systems  Constitutional: Positive for fatigue. Negative for chills and fever.  HENT: Negative for congestion and rhinorrhea.   Eyes: Negative for visual disturbance.  Respiratory: Positive for shortness of breath. Negative for cough and wheezing.   Cardiovascular: Negative for chest pain and leg swelling.  Gastrointestinal:  Negative for abdominal pain, diarrhea, nausea and vomiting.  Genitourinary: Negative for dysuria and flank pain.  Musculoskeletal: Negative for neck pain and neck stiffness.  Skin: Negative for rash and wound.  Allergic/Immunologic: Negative for immunocompromised state.  Neurological: Positive for weakness and light-headedness. Negative for syncope and headaches.  All other systems reviewed and are negative.    Physical Exam Updated Vital Signs BP 115/60   Pulse (!) 103   Temp 98.5 F (36.9 C) (Oral)   Resp 17   Ht 5\' 8"  (1.727 m)   Wt 59 kg   SpO2 98%   BMI 19.77 kg/m   Physical Exam Vitals signs and nursing note reviewed.  Constitutional:      General: He is not in acute distress.    Appearance: He is well-developed. He is ill-appearing.     Comments: Chronically ill-appearing, pale  HENT:     Head: Normocephalic and atraumatic.  Eyes:     Conjunctiva/sclera: Conjunctivae normal.  Neck:     Musculoskeletal: Neck supple.  Cardiovascular:     Rate and Rhythm: Regular rhythm. Tachycardia present.     Heart sounds: Normal heart sounds. No murmur. No friction rub.  Pulmonary:     Effort: Pulmonary effort is normal. No respiratory distress.     Breath sounds: Normal breath sounds. No wheezing or rales.  Abdominal:     General: There is no distension.     Palpations: Abdomen is soft.     Tenderness: There is no abdominal tenderness.  Genitourinary:    Comments: Stage II decubitus ulcer along midline gluteal cleft. Rectal deferred as pti son chemo but brown stool noted around anal verge. Skin:    General: Skin is warm.     Capillary Refill: Capillary refill takes less than 2 seconds.  Neurological:     Mental Status: He is alert and oriented to person, place, and time.     Motor: No abnormal muscle tone.      ED Treatments / Results  Labs (all labs ordered are listed, but only abnormal results are displayed) Labs Reviewed  CBC WITH DIFFERENTIAL/PLATELET -  Abnormal; Notable for the following components:      Result Value   WBC 1.3 (*)    RBC 3.35 (*)    Hemoglobin 8.9 (*)    HCT 29.0 (*)    RDW 19.9 (*)    Neutro Abs 0.5 (*)    Lymphs Abs 0.6 (*)    All other components within normal limits  COMPREHENSIVE METABOLIC PANEL - Abnormal; Notable for the following components:   Potassium 3.4 (*)    CO2 21 (*)    Glucose, Bld 210 (*)    Calcium 7.6 (*)    Total Protein 5.5 (*)    Albumin  2.4 (*)    All other components within normal limits  PROTIME-INR - Abnormal; Notable for the following components:   Prothrombin Time 21.4 (*)    INR 1.9 (*)    All other components within normal limits  SARS CORONAVIRUS 2 (HOSPITAL ORDER, PERFORMED IN [place] LAB)  CULTURE, BLOOD (ROUTINE X 2)  CULTURE, BLOOD (ROUTINE X 2)  LACTIC ACID, PLASMA  LACTIC ACID, PLASMA  POC OCCULT BLOOD, ED  TYPE AND SCREEN  ABO/RH    EKG None  Radiology Dg Chest Portable 1 View  Result Date: 03/28/2019 CLINICAL DATA:  Shortness of breath. Emesis, diarrhea, and epistaxis. EXAM: PORTABLE CHEST 1 VIEW COMPARISON:  Chest radiographs and CTA 03/22/2019 FINDINGS: A right jugular Port-A-Cath terminates over the lower SVC, unchanged. The cardiomediastinal silhouette is unchanged with normal heart size. 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 Left upper lobe nodular densities on CT are partially obscured radiographically due to innumerable sclerotic bone metastases including involvement of the ribs and scapulae which result in bone lesions projecting over the lungs. Mildly increased patchy airspace opacity is questioned in the left mid lung. No sizable pleural effusion or pneumothorax is identified. IMPRESSION: 1. Questionable new/increased infiltrate in the left midlung. 2. Diffuse sclerotic bone metastases. Electronically Signed   By: Logan Bores M.D.   On: 03/28/2019 18:12    Procedures Procedures (including critical care time)  Medications Ordered in ED  Medications  vancomycin (VANCOCIN) 1,250 mg in sodium chloride 0.9 % 250 mL IVPB (1,250 mg Intravenous New Bag/Given 03/28/19 2034)  0.9 %  sodium chloride infusion ( Intravenous New Bag/Given 03/28/19 1944)  sodium chloride 0.9 % bolus 1,000 mL (1,000 mLs Intravenous New Bag/Given 03/28/19 1943)  ceFEPIme (MAXIPIME) 2 g in sodium chloride 0.9 % 100 mL IVPB (0 g Intravenous Stopped 03/28/19 2023)     Initial Impression / Assessment and Plan / ED Course  I have reviewed the triage vital signs and the nursing notes.  Pertinent labs & imaging results that were available during my care of the patient were reviewed by me and considered in my medical decision making (see chart for details).  Clinical Course as of Mar 28 2043  Sun Mar 28, 2019  1637 78 yo M here with weakness, melena, and epistaxis in setting of recent eliquis initiation. Suspect symptomatic anemia with GIB. No signs of active or brisk bleed. Epistaxis controlled. Will check labs, plan for likely admission. Sats well despite his recent PE which is reassuring. No fevers or infectious sx.   [CI]  1956 CXR concerning for possible new infiltrate. Broad spectrum ABX given. Cautious fluids given in setting of known PE with possible pulm HTN, possible COVID, and worsening anemia.    [place] reviewed. CBC with leukopenia and neutropenia - suspect this is chemo related, pt is 1 wk post treatment - ABX given. CMP at baseline, with mild dehydration for which pt has been given fluids. Stool heme neg which is reassuring. LA pending. Plan to admit.   [CI]  2043 LA normal. Admit for persistent tachycardia, transient hypotension, possible PNA in setting of neutropenia, as well as possible GIB.   [CI]    Clinical Course User Index [CI] Duffy Bruce, MD      Dr. Burr Medico of Oncology aware- will have Dr. Irene Limbo see in AM.  Final Clinical Impressions(s) / ED Diagnoses   Final diagnoses:  Chemotherapy-induced neutropenia ([place])  HCAP  (healthcare-associated pneumonia)  Epistaxis    ED Discharge Orders  None       Duffy Bruce, MD 03/28/19 2228

## 2019-03-28 NOTE — Progress Notes (Signed)
A consult was received from an ED physician for vancomycin and cefepime per pharmacy dosing.  The patient's profile has been reviewed for ht/wt/allergies/indication/available labs.   A one time order has been placed for vancomycin 1250 mg and cefepime 2 gm.    Further antibiotics/pharmacy consults should be ordered by admitting physician if indicated.                       Thank you,  Eudelia Bunch, Pharm.D 03/28/2019 6:58 PM

## 2019-03-28 NOTE — H&P (Signed)
History and Physical    Michael Frey YIR:[id number] DOB: August 13, 1941 DOA: 03/28/2019  PCP: System, Pcp Not In  Patient coming from: Home  Chief Complaint: Weakness, nosebleed and melena  HPI: Michael Frey is a 78 y.o. male with medical history significant of advanced pancreatic cancer on chemotherapy recent hospitalization discharged on 520 for new PE and DVT started on Eliquis reports has been having melanotic stools at home for a week and also had a nosebleed today was feeling very weak so came the emergency department.  Denies any fevers denies any cough denies any shortness of breath.  He does have diarrhea that is chronic he denies any nausea or vomiting.  His nosebleed has stopped.  His hemoglobin last week was 10.4 it is currently 8.9 white count has also gone down to 1.3.  He is usually on growth factor per Dr. Alen Blew his oncologist.  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  Patient be referred for admission for new infiltrate on chest x-ray in the setting of neutropenia with a drop in his hemoglobin with questionable GI bleed.  No overt bleeding in the ED.  His heme stool was negative.  Review of Systems: As per HPI otherwise 10 point review of systems negative.   Past Medical History:  Diagnosis Date  . Cancer ([place])   . Diabetes mellitus without complication [facility])     Past Surgical History:  Procedure Laterality Date  . CHOLECYSTECTOMY    . paratial pancrectomy    . PORTACATH PLACEMENT    . SPLENECTOMY       reports that he has never smoked. He has never used smokeless tobacco. He reports that he does not drink alcohol or use drugs.  Allergies  Allergen Reactions  . Naproxen     Family History  Problem Relation Age of Onset  . Stroke Father   . Hypertension Other     Prior to Admission medications   Medication Sig Start Date End Date Taking? Authorizing Provider  acetaminophen (TYLENOL) 500 MG tablet Take 500 mg by  mouth every 6 (six) hours as needed (2 tabs as needed for pain).    [provider]  apixaban (ELIQUIS) 5 MG TABS tablet Take 2 tablets (10 mg total) by mouth 2 (two) times daily for 7 days. 03/24/19 03/31/19  Shelly Coss, MD  apixaban (ELIQUIS) 5 MG TABS tablet Take 1 tablet (5 mg total) by mouth 2 (two) times daily for 30 days. 03/31/19 04/30/19  Shelly Coss, MD  fluconazole (DIFLUCAN) 200 MG tablet Take 1 tablet (200 mg total) by mouth 2 times daily at 12 noon and 4 pm. For 30 days 02/17/19   Thayer Headings, MD  Insulin Glargine, 1 Unit Dial, (TOUJEO SOLOSTAR) 300 UNIT/ML SOPN Inject 15 Units into the skin 2 (two) times daily.    [provider]  metFORMIN (GLUCOPHAGE) 1000 MG tablet Take 1,000 mg by mouth 2 (two) times daily with a meal.    [provider]  metoprolol succinate (TOPROL-XL) 25 MG 24 hr tablet Take 25 mg by mouth daily.    [provider]  nitroGLYCERIN (NITROSTAT) 0.4 MG SL tablet Place 0.4 mg under the tongue every 5 (five) minutes as needed for chest pain.    [provider]  prochlorperazine (COMPAZINE) 10 MG tablet Take 1 tablet (10 mg total) by mouth every 6 (six) hours as needed for nausea or vomiting. 02/10/19   Wyatt Portela, MD  rosuvastatin (CRESTOR) 10 MG tablet Take 10 mg by mouth daily.    [provider]    Physical Exam: Vitals:   03/28/19 1930 03/28/19 2015 03/28/19 2023 03/28/19 2030  BP: 126/75   115/60  Pulse: (!) 119   (!) 103  Resp: 18   17  Temp:  98.5 F (36.9 C)    TempSrc:  Oral    SpO2: 99%   98%  Weight:   59 kg   Height:   5\' 8"  (1.727 m)       Constitutional: NAD, calm, comfortable Vitals:   03/28/19 1930 03/28/19 2015 03/28/19 2023 03/28/19 2030  BP: 126/75   115/60  Pulse: (!) 119   (!) 103  Resp: 18   17  Temp:  98.5 F (36.9 C)    TempSrc:  Oral    SpO2: 99%   98%  Weight:   59 kg   Height:   5\' 8"  (1.727 m)    Eyes: PERRL, lids and conjunctivae normal ENMT: Mucous  membranes are moist. Posterior pharynx clear of any exudate or lesions.Normal dentition.  Neck: normal, supple, no masses, no thyromegaly Respiratory: clear to auscultation bilaterally, no wheezing, no crackles. Normal respiratory effort. No accessory muscle use.  Cardiovascular: Regular rate and rhythm, no murmurs / rubs / gallops. No extremity edema. 2+ pedal pulses. No carotid bruits.  Abdomen: no tenderness, no masses palpated. No hepatosplenomegaly. Bowel sounds positive.  Musculoskeletal: no clubbing / cyanosis. No joint deformity upper and lower extremities. Good ROM, no contractures. Normal muscle tone.  Skin: no rashes, lesions, ulcers. No induration Neurologic: CN 2-12 grossly intact. Sensation intact, DTR normal. Strength 5/5 in all 4.  Psychiatric: Normal judgment and insight. Alert and oriented x 3. Normal mood.    Labs on Admission: I have personally reviewed following labs and imaging studies  CBC: Recent Labs  Lab 03/22/19 1559 03/22/19 2252 03/23/19 0400 03/24/19 0500 03/28/19 1640  WBC 53.6* 48.9* 40.3* 17.2* 1.3*  NEUTROABS  --  42.6*  --   --  0.5*  HGB 11.6* 10.9* 10.3* 10.4* 8.9*  HCT 37.4* 34.9* 32.8* 34.8* 29.0*  MCV 87.8 86.0 87.0 85.9 86.6  PLT 284 230 237 215 993   Basic Metabolic Panel: Recent Labs  Lab 03/22/19 1559 03/23/19 0400 03/24/19 0500 03/28/19 1640  NA 136 137 137 137  K 4.2 3.6 3.3* 3.4*  CL 99 104 103 111  CO2 21* 24 27 21*  GLUCOSE 238* 83 99 210*  BUN 25* 19 14 9   CREATININE 1.06 0.73 0.64 0.73  CALCIUM 8.9 8.2* 8.6* 7.6*  MG  --  1.6* 2.1  --   PHOS  --  3.7  --   --    GFR: Estimated Creatinine Clearance: 61.5 mL/min (by C-G formula based on SCr of 0.73 mg/dL). Liver Function Tests: Recent Labs  Lab 03/23/19 0400 03/28/19 1640  AST 31 16  ALT 33 15  ALKPHOS 83 56  BILITOT 0.7 0.4  PROT 5.5* 5.5*  ALBUMIN 2.7* 2.4*   No results for input(s): LIPASE, AMYLASE in the last 168 hours. No results for input(s): AMMONIA  in the last 168 hours. Coagulation Profile: Recent Labs  Lab 03/28/19 1640  INR 1.9*   Cardiac Enzymes: Recent Labs  Lab 03/22/19 1559 03/22/19 2232 03/23/19 0400 03/23/19 1224  TROPONINI 0.03* <0.03 <0.03 <0.03   BNP (last 3 results) No results for input(s): PROBNP in the last 8760 hours. HbA1C: No results for input(s): HGBA1C in  the last 72 hours. CBG: Recent Labs  Lab 03/23/19 0813 03/23/19 1146 03/23/19 1658 03/23/19 2204 03/24/19 0747  GLUCAP 80 173* 259* 157* 89   Lipid Profile: No results for input(s): CHOL, HDL, LDLCALC, TRIG, CHOLHDL, LDLDIRECT in the last 72 hours. Thyroid Function Tests: No results for input(s): TSH, T4TOTAL, FREET4, T3FREE, THYROIDAB in the last 72 hours. Anemia Panel: No results for input(s): VITAMINB12, FOLATE, FERRITIN, TIBC, IRON, RETICCTPCT in the last 72 hours. Urine analysis: No results found for: COLORURINE, APPEARANCEUR, LABSPEC, PHURINE, GLUCOSEU, HGBUR, BILIRUBINUR, KETONESUR, PROTEINUR, UROBILINOGEN, NITRITE, LEUKOCYTESUR Sepsis Labs: !!!!!!!!!!!!!!!!!!!!!!!!!!!!!!!!!!!!!!!!!!!! @LABRCNTIP (procalcitonin:4,lacticidven:4) ) Recent Results (from the past 240 hour(s))  SARS Coronavirus 2 (CEPHEID - Performed in [place] hospital lab), Hosp Order     Status: None   Collection Time: 03/22/19  4:40 PM  Result Value Ref Range Status   SARS Coronavirus 2 NEGATIVE NEGATIVE Final    Comment: (NOTE) If result is NEGATIVE SARS-CoV-2 target nucleic acids are NOT DETECTED. The SARS-CoV-2 RNA is generally detectable in upper and lower  respiratory specimens during the acute phase of infection. The lowest  concentration of SARS-CoV-2 viral copies this assay can detect is 250  copies / mL. A negative result does not preclude SARS-CoV-2 infection  and should not be used as the sole basis for treatment or other  patient management decisions.  A negative result may occur with  improper specimen collection / handling, submission of specimen  other  than nasopharyngeal swab, presence of viral mutation(s) within the  areas targeted by this assay, and inadequate number of viral copies  (<250 copies / mL). A negative result must be combined with clinical  observations, patient history, and epidemiological information. If result is POSITIVE SARS-CoV-2 target nucleic acids are DETECTED. The SARS-CoV-2 RNA is generally detectable in upper and lower  respiratory specimens dur ing the acute phase of infection.  Positive  results are indicative of active infection with SARS-CoV-2.  Clinical  correlation with patient history and other diagnostic information is  necessary to determine patient infection status.  Positive results do  not rule out bacterial infection or co-infection with other viruses. If result is PRESUMPTIVE POSTIVE SARS-CoV-2 nucleic acids MAY BE PRESENT.   A presumptive positive result was obtained on the submitted specimen  and confirmed on repeat testing.  While 2019 novel coronavirus  (SARS-CoV-2) nucleic acids may be present in the submitted sample  additional confirmatory testing may be necessary for epidemiological  and / or clinical management purposes  to differentiate between  SARS-CoV-2 and other Sarbecovirus currently known to infect humans.  If clinically indicated additional testing with an alternate test  methodology [phone]) is advised. The SARS-CoV-2 RNA is generally  detectable in upper and lower respiratory sp ecimens during the acute  phase of infection. The expected result is Negative. Fact Sheet for Patients:  StrictlyIdeas.no Fact Sheet for Healthcare Providers: BankingDealers.co.za This test is not yet approved or cleared by the [place] FDA and has been authorized for detection and/or diagnosis of SARS-CoV-2 by FDA under an Emergency Use Authorization (EUA).  This EUA will remain in effect (meaning this test can be used) for the duration  of the COVID-19 declaration under Section 564(b)(1) of the Act, 21 U.S.C. section 360bbb-3(b)(1), unless the authorization is terminated or revoked sooner. Performed at [facility], [place] [address] [place], [place] [address]   MRSA PCR Screening     Status: None   Collection Time: 03/22/19 10:36 PM  Result Value Ref Range Status  MRSA by PCR NEGATIVE NEGATIVE Final    Comment:        The GeneXpert MRSA Assay (FDA approved for NASAL specimens only), is one component of a comprehensive MRSA colonization surveillance program. It is not intended to diagnose MRSA infection nor to guide or monitor treatment for MRSA infections. Performed at [facility], [place] [address] [place], [place] [address]   SARS Coronavirus 2 (CEPHEID - Performed in [place] hospital lab), Hosp Order     Status: None   Collection Time: 03/28/19  4:55 PM  Result Value Ref Range Status   SARS Coronavirus 2 NEGATIVE NEGATIVE Final    Comment: (NOTE) If result is NEGATIVE SARS-CoV-2 target nucleic acids are NOT DETECTED. The SARS-CoV-2 RNA is generally detectable in upper and lower  respiratory specimens during the acute phase of infection. The lowest  concentration of SARS-CoV-2 viral copies this assay can detect is 250  copies / mL. A negative result does not preclude SARS-CoV-2 infection  and should not be used as the sole basis for treatment or other  patient management decisions.  A negative result may occur with  improper specimen collection / handling, submission of specimen other  than nasopharyngeal swab, presence of viral mutation(s) within the  areas targeted by this assay, and inadequate number of viral copies  (<250 copies / mL). A negative result must be combined with clinical  observations, patient history, and epidemiological information. If result is POSITIVE SARS-CoV-2 target nucleic acids are DETECTED. The SARS-CoV-2 RNA is generally detectable in  upper and lower  respiratory specimens dur ing the acute phase of infection.  Positive  results are indicative of active infection with SARS-CoV-2.  Clinical  correlation with patient history and other diagnostic information is  necessary to determine patient infection status.  Positive results do  not rule out bacterial infection or co-infection with other viruses. If result is PRESUMPTIVE POSTIVE SARS-CoV-2 nucleic acids MAY BE PRESENT.   A presumptive positive result was obtained on the submitted specimen  and confirmed on repeat testing.  While 2019 novel coronavirus  (SARS-CoV-2) nucleic acids may be present in the submitted sample  additional confirmatory testing may be necessary for epidemiological  and / or clinical management purposes  to differentiate between  SARS-CoV-2 and other Sarbecovirus currently known to infect humans.  If clinically indicated additional testing with an alternate test  methodology [phone]) is advised. The SARS-CoV-2 RNA is generally  detectable in upper and lower respiratory sp ecimens during the acute  phase of infection. The expected result is Negative. Fact Sheet for Patients:  StrictlyIdeas.no Fact Sheet for Healthcare Providers: BankingDealers.co.za This test is not yet approved or cleared by the [place] FDA and has been authorized for detection and/or diagnosis of SARS-CoV-2 by FDA under an Emergency Use Authorization (EUA).  This EUA will remain in effect (meaning this test can be used) for the duration of the COVID-19 declaration under Section 564(b)(1) of the Act, 21 U.S.C. section 360bbb-3(b)(1), unless the authorization is terminated or revoked sooner. Performed at [facility], [place] [address], [place], [place] [address]      Radiological Exams on Admission: Dg Chest Portable 1 View  Result Date: 03/28/2019 CLINICAL DATA:  Shortness of breath. Emesis, diarrhea,  and epistaxis. EXAM: PORTABLE CHEST 1 VIEW COMPARISON:  Chest radiographs and CTA 03/22/2019 FINDINGS: A right jugular Port-A-Cath terminates over the lower SVC, unchanged. The cardiomediastinal silhouette is unchanged with normal heart size. 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 Left upper lobe nodular densities on CT are partially obscured radiographically due to innumerable sclerotic bone metastases including involvement of the ribs and scapulae which result in bone lesions projecting over the lungs. Mildly increased patchy airspace opacity is questioned in the left mid lung. No sizable pleural effusion or pneumothorax is identified. IMPRESSION: 1. Questionable new/increased infiltrate in the left midlung. 2. Diffuse sclerotic bone metastases. Electronically Signed   By: Logan Bores M.D.   On: 03/28/2019 18:12   Case discussed with Dr. Ellender Hose in the ED Old chart reviewed  Assessment/Plan 78 year old male advanced pancreatic cancer on chemotherapy comes in with possible bleeding issues recently started on Eliquis for recent pulmonary emboli with possible new pneumonia Principal Problem:   Anemia associated with chemotherap-unclear if his drop in his hemoglobin is due to his chemotherapy or from bleeding.  He has heme stool was negative and his epistaxis has resolved.  Monitor closely for any recurrence of bleeding.  Consult oncology for further recommendations.  Hold Eliquis for now until oncology sees him in the morning.  Hemoglobin above 8 repeat in the morning.  Active Problems:   Hospital acquired PNA-obtain cultures and placed on vancomycin and cefepime.    Pulmonary embolus (HCC)-just diagnosed in the last month currently on Eliquis holding until in the morning until seen by his oncologist    Melena-heme-negative stool.  As above    Neutropenia (HCC)-placed on precautions.  No fever.  Oncologist consulted Dr. Alen Blew.    Epistaxis-resolved.  Platelets normal.    Prostate cancer  (HCC)-noted    DM (diabetes mellitus), type 2 with complications (HCC)-continue Lantus placed on sliding scale insulin glucose currently over 200    CAD (coronary artery disease)-stable    DVT prophylaxis:  Eliquis on hold Code Status: Full Family Communication: None Disposition Plan: 1 to 3 days Consults called: Oncology Admission status: Admission   Eliberto Sole A MD [facility]  If 7PM-7AM, please contact night-coverage [email or web address] Password [facility]  03/28/2019, 8:53 PM

## 2019-03-28 NOTE — ED Notes (Signed)
He also tells me that he was dx with P.E. this week and was prescribed Elequis.

## 2019-03-29 ENCOUNTER — Other Ambulatory Visit: Payer: Self-pay

## 2019-03-29 ENCOUNTER — Encounter (HOSPITAL_COMMUNITY): Payer: Self-pay

## 2019-03-29 DIAGNOSIS — D701 Agranulocytosis secondary to cancer chemotherapy: Principal | ICD-10-CM

## 2019-03-29 DIAGNOSIS — J189 Pneumonia, unspecified organism: Secondary | ICD-10-CM

## 2019-03-29 LAB — CBC WITH DIFFERENTIAL/PLATELET
Abs Immature Granulocytes: 0.05 10*3/uL (ref 0.00–0.07)
Basophils Absolute: 0 10*3/uL (ref 0.0–0.1)
Basophils Relative: 2 %
Eosinophils Absolute: 0 10*3/uL (ref 0.0–0.5)
Eosinophils Relative: 1 %
HCT: 28.9 % — ABNORMAL LOW (ref 39.0–52.0)
Hemoglobin: 8.7 g/dL — ABNORMAL LOW (ref 13.0–17.0)
Immature Granulocytes: 2 %
Lymphocytes Relative: 40 %
Lymphs Abs: 1 10*3/uL (ref 0.7–4.0)
MCH: 26.2 pg (ref 26.0–34.0)
MCHC: 30.1 g/dL (ref 30.0–36.0)
MCV: 87 fL (ref 80.0–100.0)
Monocytes Absolute: 0.3 10*3/uL (ref 0.1–1.0)
Monocytes Relative: 10 %
Neutro Abs: 1.1 10*3/uL — ABNORMAL LOW (ref 1.7–7.7)
Neutrophils Relative %: 45 %
Platelets: 241 10*3/uL (ref 150–400)
RBC: 3.32 MIL/uL — ABNORMAL LOW (ref 4.22–5.81)
RDW: 20 % — ABNORMAL HIGH (ref 11.5–15.5)
WBC: 2.5 10*3/uL — ABNORMAL LOW (ref 4.0–10.5)
nRBC: 0.8 % — ABNORMAL HIGH (ref 0.0–0.2)

## 2019-03-29 LAB — BASIC METABOLIC PANEL
Anion gap: 8 (ref 5–15)
BUN: 8 mg/dL (ref 8–23)
CO2: 20 mmol/L — ABNORMAL LOW (ref 22–32)
Calcium: 7.4 mg/dL — ABNORMAL LOW (ref 8.9–10.3)
Chloride: 110 mmol/L (ref 98–111)
Creatinine, Ser: 0.74 mg/dL (ref 0.61–1.24)
GFR calc Af Amer: >60 mL/min (ref >60)
GFR calc non Af Amer: >60 mL/min (ref >60)
Glucose, Bld: 194 mg/dL — ABNORMAL HIGH (ref 70–99)
Potassium: 3.1 mmol/L — ABNORMAL LOW (ref 3.5–5.1)
Sodium: 138 mmol/L (ref 135–145)

## 2019-03-29 LAB — GLUCOSE, CAPILLARY
Glucose-Capillary: 152 mg/dL — ABNORMAL HIGH (ref 70–99)
Glucose-Capillary: 161 mg/dL — ABNORMAL HIGH (ref 70–99)
Glucose-Capillary: 117 mg/dL — ABNORMAL HIGH (ref 70–99)
Glucose-Capillary: 204 mg/dL — ABNORMAL HIGH (ref 70–99)

## 2019-03-29 LAB — ABO/RH: ABO/RH(D): A POS

## 2019-03-29 MED ORDER — POTASSIUM CHLORIDE CRYS ER 20 MEQ PO TBCR
40.0000 meq | EXTENDED_RELEASE_TABLET | Freq: Two times a day (BID) | ORAL | Status: AC
  Administered 2019-03-29 (×2): 40 meq via ORAL
  Filled 2019-03-29 (×2): qty 2

## 2019-03-29 MED ORDER — SODIUM CHLORIDE 0.9% FLUSH: 10.0000 mL | INTRAVENOUS | Status: DC | PRN

## 2019-03-29 NOTE — Progress Notes (Addendum)
[facility] PROGRESS NOTE    Progress Note  Michael Frey  VPX:[id number] DOB: Aug 01, 1941 DOA: 03/28/2019 PCP: System, Pcp Not In     Brief Narrative:   Michael Frey is an 78 y.o. male past medical history significant for advanced pancreatic cancer on chemotherapy recent hospitalization on 03/24/2019 for new PE and DVT started on Eliquis which has been having melanotic stools for 1 week and a nosebleed.  Assessment/Plan:   Anemia associated with chemotherapy: Of unclear etiology, has a mild drop in hemoglobin, his guaiac was negative Eliquis was held on admission. His hemoglobin has slowly been drifting down we will continue to guaiac his stools x3. Dr. Alen Blew his oncologist has been consulted by the admitting physician  Abnormal chest x-ray: He was started empirically on IV vancomycin and cefepime. Culture data is pending. Denies any cough or shortness of breath has no fever or leukocytosis discontinue IV vancomycin and cefepime. Continue to monitor fever curve closely.  Pulmonary embolus ([place]); Holding Eliquis.  Neutropenia: Placed on precaution no fevers in house.  Epistaxis: Now resolved.  Platelet count is normal Eliquis is on hold.  DM (diabetes mellitus), type 2 with complications (HCC) Continue Lantus plus sliding scale.   DVT prophylaxis: SCD Family Communication:none Disposition Plan/Barrier to D/C: unable to determine Code Status:     Code Status Orders  (From admission, onward)         Start     Ordered   03/28/19 2056  Full code  Continuous     03/28/19 2057        Code Status History    Date Active Date Inactive Code Status Order ID Comments User Context   03/22/2019 2230 03/24/2019 1508 Full Code [id number]  Toy Baker, MD Inpatient    Advance Directive Documentation     Most Recent Value  Type of Advance Directive  Healthcare Power of [place], Living will  Pre-existing out of facility DNR order (yellow form or pink MOST  form)  -  "MOST" Form in Place?  -        IV Access:    Peripheral IV   Procedures and diagnostic studies:   Dg Chest Portable 1 View  Result Date: 03/28/2019 CLINICAL DATA:  Shortness of breath. Emesis, diarrhea, and epistaxis. EXAM: PORTABLE CHEST 1 VIEW COMPARISON:  Chest radiographs and CTA 03/22/2019 FINDINGS: A right jugular Port-A-Cath terminates over the lower SVC, unchanged. The cardiomediastinal silhouette is unchanged with normal heart size. 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 Left upper lobe nodular densities on CT are partially obscured radiographically due to innumerable sclerotic bone metastases including involvement of the ribs and scapulae which result in bone lesions projecting over the lungs. Mildly increased patchy airspace opacity is questioned in the left mid lung. No sizable pleural effusion or pneumothorax is identified. IMPRESSION: 1. Questionable new/increased infiltrate in the left midlung. 2. Diffuse sclerotic bone metastases. Electronically Signed   By: Logan Bores M.D.   On: 03/28/2019 18:12     Medical Consultants:    None.  Anti-Infectives:   None  Subjective:    Michael Frey is a poor historian he cannot tell me if his stools are black.  Objective:    Vitals:   03/28/19 2100 03/28/19 2130 03/28/19 2219 03/29/19 0533  BP: 112/64 (!) 115/58 121/65 110/65  Pulse: (!) 101 (!) 104 (!) 106 93  Resp: 14 15 19 18   Temp:   98.9 F (37.2 C) 99.7 F (37.6 C)  TempSrc:   Oral Oral  SpO2: 98% 96% 96% 96%  Weight:   60.3 kg   Height:   5\' 8"  (1.727 m)     Intake/Output Summary (Last 24 hours) at 03/29/2019 1058 Last data filed at 03/29/2019 0831 Gross per 24 hour  Intake 2180 ml  Output -  Net 2180 ml   Filed Weights   03/28/19 2023 03/28/19 2219  Weight: 59 kg 60.3 kg    Exam: General exam: In no acute distress. Respiratory system: Good air movement and clear to auscultation. Cardiovascular system: S1 & S2 heard, RRR.   Gastrointestinal system: Abdomen is nondistended, soft and nontender.  Central nervous system: Alert and oriented. No focal neurological deficits. Extremities: No pedal edema. Skin: No rashes, lesions or ulcers Psychiatry: Judgement and insight appear normal. Mood & affect appropriate.   Data Reviewed:    Labs: Basic Metabolic Panel: Recent Labs  Lab 03/22/19 1559 03/23/19 0400 03/24/19 0500 03/28/19 1640 03/29/19 0447  NA 136 137 137 137 138  K 4.2 3.6 3.3* 3.4* 3.1*  CL 99 104 103 111 110  CO2 21* 24 27 21* 20*  GLUCOSE 238* 83 99 210* 194*  BUN 25* 19 14 9 8   CREATININE 1.06 0.73 0.64 0.73 0.74  CALCIUM 8.9 8.2* 8.6* 7.6* 7.4*  MG  --  1.6* 2.1  --   --   PHOS  --  3.7  --   --   --    GFR Estimated Creatinine Clearance: 62.8 mL/min (by C-G formula based on SCr of 0.74 mg/dL). Liver Function Tests: Recent Labs  Lab 03/23/19 0400 03/28/19 1640  AST 31 16  ALT 33 15  ALKPHOS 83 56  BILITOT 0.7 0.4  PROT 5.5* 5.5*  ALBUMIN 2.7* 2.4*   No results for input(s): LIPASE, AMYLASE in the last 168 hours. No results for input(s): AMMONIA in the last 168 hours. Coagulation profile Recent Labs  Lab 03/28/19 1640  INR 1.9*   COVID-19 Labs  No results for input(s): DDIMER, FERRITIN, LDH, CRP in the last 72 hours.  Lab Results  Component Value Date   SARSCOV2NAA NEGATIVE 03/28/2019   [place] NEGATIVE 03/22/2019    CBC: Recent Labs  Lab 03/22/19 2252 03/23/19 0400 03/24/19 0500 03/28/19 1640 03/29/19 0447  WBC 48.9* 40.3* 17.2* 1.3* 2.5*  NEUTROABS 42.6*  --   --  0.5* 1.1*  HGB 10.9* 10.3* 10.4* 8.9* 8.7*  HCT 34.9* 32.8* 34.8* 29.0* 28.9*  MCV 86.0 87.0 85.9 86.6 87.0  PLT 230 237 215 236 241   Cardiac Enzymes: Recent Labs  Lab 03/22/19 1559 03/22/19 2232 03/23/19 0400 03/23/19 1224  TROPONINI 0.03* <0.03 <0.03 <0.03   BNP (last 3 results) No results for input(s): PROBNP in the last 8760 hours. CBG: Recent Labs  Lab 03/23/19 1658  03/23/19 2204 03/24/19 0747 03/28/19 2306 03/29/19 0732  GLUCAP 259* 157* 89 130* 152*   D-Dimer: No results for input(s): DDIMER in the last 72 hours. Hgb A1c: No results for input(s): HGBA1C in the last 72 hours. Lipid Profile: No results for input(s): CHOL, HDL, LDLCALC, TRIG, CHOLHDL, LDLDIRECT in the last 72 hours. Thyroid function studies: No results for input(s): TSH, T4TOTAL, T3FREE, THYROIDAB in the last 72 hours.  Invalid input(s): FREET3 Anemia work up: No results for input(s): VITAMINB12, FOLATE, FERRITIN, TIBC, IRON, RETICCTPCT in the last 72 hours. Sepsis Labs: Recent Labs  Lab 03/23/19 0400 03/24/19 0500 03/28/19 1640 03/28/19 1926 03/29/19 0447  WBC 40.3* 17.2* 1.3*  --  2.5*  LATICACIDVEN  --   --   --  1.4  --    Microbiology Recent Results (from the past 240 hour(s))  SARS Coronavirus 2 (CEPHEID - Performed in [place] hospital lab), Hosp Order     Status: None   Collection Time: 03/22/19  4:40 PM  Result Value Ref Range Status   SARS Coronavirus 2 NEGATIVE NEGATIVE Final    Comment: (NOTE) If result is NEGATIVE SARS-CoV-2 target nucleic acids are NOT DETECTED. The SARS-CoV-2 RNA is generally detectable in upper and lower  respiratory specimens during the acute phase of infection. The lowest  concentration of SARS-CoV-2 viral copies this assay can detect is 250  copies / mL. A negative result does not preclude SARS-CoV-2 infection  and should not be used as the sole basis for treatment or other  patient management decisions.  A negative result may occur with  improper specimen collection / handling, submission of specimen other  than nasopharyngeal swab, presence of viral mutation(s) within the  areas targeted by this assay, and inadequate number of viral copies  (<250 copies / mL). A negative result must be combined with clinical  observations, patient history, and epidemiological information. If result is POSITIVE SARS-CoV-2 target nucleic  acids are DETECTED. The SARS-CoV-2 RNA is generally detectable in upper and lower  respiratory specimens dur ing the acute phase of infection.  Positive  results are indicative of active infection with SARS-CoV-2.  Clinical  correlation with patient history and other diagnostic information is  necessary to determine patient infection status.  Positive results do  not rule out bacterial infection or co-infection with other viruses. If result is PRESUMPTIVE POSTIVE SARS-CoV-2 nucleic acids MAY BE PRESENT.   A presumptive positive result was obtained on the submitted specimen  and confirmed on repeat testing.  While 2019 novel coronavirus  (SARS-CoV-2) nucleic acids may be present in the submitted sample  additional confirmatory testing may be necessary for epidemiological  and / or clinical management purposes  to differentiate between  SARS-CoV-2 and other Sarbecovirus currently known to infect humans.  If clinically indicated additional testing with an alternate test  methodology [phone]) is advised. The SARS-CoV-2 RNA is generally  detectable in upper and lower respiratory sp ecimens during the acute  phase of infection. The expected result is Negative. Fact Sheet for Patients:  StrictlyIdeas.no Fact Sheet for Healthcare Providers: BankingDealers.co.za This test is not yet approved or cleared by the [place] FDA and has been authorized for detection and/or diagnosis of SARS-CoV-2 by FDA under an Emergency Use Authorization (EUA).  This EUA will remain in effect (meaning this test can be used) for the duration of the COVID-19 declaration under Section 564(b)(1) of the Act, 21 U.S.C. section 360bbb-3(b)(1), unless the authorization is terminated or revoked sooner. Performed at [facility], [place] [address], [place], [place] [address]   MRSA PCR Screening     Status: None   Collection Time: 03/22/19 10:36 PM   Result Value Ref Range Status   MRSA by PCR NEGATIVE NEGATIVE Final    Comment:        The GeneXpert MRSA Assay (FDA approved for NASAL specimens only), is one component of a comprehensive MRSA colonization surveillance program. It is not intended to diagnose MRSA infection nor to guide or monitor treatment for MRSA infections. Performed at [facility], [place] [address], [place], [place] [address]   SARS Coronavirus 2 (CEPHEID - Performed in [facility] hospital lab), Hosp Order     Status: None   Collection Time: 03/28/19  4:55 PM  Result Value Ref Range Status   SARS Coronavirus 2 NEGATIVE NEGATIVE Final    Comment: (NOTE) If result is NEGATIVE SARS-CoV-2 target nucleic acids are NOT DETECTED. The SARS-CoV-2 RNA is generally detectable in upper and lower  respiratory specimens during the acute phase of infection. The lowest  concentration of SARS-CoV-2 viral copies this assay can detect is 250  copies / mL. A negative result does not preclude SARS-CoV-2 infection  and should not be used as the sole basis for treatment or other  patient management decisions.  A negative result may occur with  improper specimen collection / handling, submission of specimen other  than nasopharyngeal swab, presence of viral mutation(s) within the  areas targeted by this assay, and inadequate number of viral copies  (<250 copies / mL). A negative result must be combined with clinical  observations, patient history, and epidemiological information. If result is POSITIVE SARS-CoV-2 target nucleic acids are DETECTED. The SARS-CoV-2 RNA is generally detectable in upper and lower  respiratory specimens dur ing the acute phase of infection.  Positive  results are indicative of active infection with SARS-CoV-2.  Clinical  correlation with patient history and other diagnostic information is  necessary to determine patient infection status.  Positive results do  not rule out bacterial  infection or co-infection with other viruses. If result is PRESUMPTIVE POSTIVE SARS-CoV-2 nucleic acids MAY BE PRESENT.   A presumptive positive result was obtained on the submitted specimen  and confirmed on repeat testing.  While 2019 novel coronavirus  (SARS-CoV-2) nucleic acids may be present in the submitted sample  additional confirmatory testing may be necessary for epidemiological  and / or clinical management purposes  to differentiate between  SARS-CoV-2 and other Sarbecovirus currently known to infect humans.  If clinically indicated additional testing with an alternate test  methodology [phone]) is advised. The SARS-CoV-2 RNA is generally  detectable in upper and lower respiratory sp ecimens during the acute  phase of infection. The expected result is Negative. Fact Sheet for Patients:  StrictlyIdeas.no Fact Sheet for Healthcare Providers: BankingDealers.co.za This test is not yet approved or cleared by the [place] FDA and has been authorized for detection and/or diagnosis of SARS-CoV-2 by FDA under an Emergency Use Authorization (EUA).  This EUA will remain in effect (meaning this test can be used) for the duration of the COVID-19 declaration under Section 564(b)(1) of the Act, 21 U.S.C. section 360bbb-3(b)(1), unless the authorization is terminated or revoked sooner. Performed at [facility], [place] [address] [place], [place] [address]   Blood culture (routine x 2)     Status: None (Preliminary result)   Collection Time: 03/28/19  7:26 PM  Result Value Ref Range Status   Specimen Description   Final    BLOOD BLOOD LEFT FOREARM Performed at [place] [address] [place], [place] [address]    Special Requests   Final    BOTTLES DRAWN AEROBIC ONLY Blood Culture adequate volume Performed at [place] [address], [place], [place] [address]     Culture   Final    NO GROWTH < 12 HOURS Performed at [facility] [address] [place], [place] [address]    Report Status PENDING  Incomplete  Blood culture (routine x 2)     Status: None (Preliminary result)   Collection Time: 03/28/19  7:26 PM  Result Value Ref Range Status   Specimen Description   Final  BLOOD LEFT ANTECUBITAL Performed at [facility] [address] [place], [place] [address]    Special Requests   Final    BOTTLES DRAWN AEROBIC ONLY Blood Culture results may not be optimal due to an inadequate volume of blood received in culture bottles Performed at [facility] [address] [place], [place] [address]    Culture   Final    NO GROWTH < 12 HOURS Performed at [facility] [address] [place], [place] [address]    Report Status PENDING  Incomplete     Medications:   . insulin aspart  0-9 Units Subcutaneous TID WC  . insulin glargine  15 Units Subcutaneous BID  . metoprolol succinate  25 mg Oral Daily  . sodium chloride flush  3 mL Intravenous Q12H   Continuous Infusions: . sodium chloride 250 mL (03/29/19 0527)  . ceFEPime (MAXIPIME) IV 2 g (03/29/19 0530)  . vancomycin         LOS: 1 day   Charlynne Cousins  [facility]  03/29/2019, 10:58 AM

## 2019-03-30 ENCOUNTER — Telehealth: Payer: Self-pay | Admitting: *Deleted

## 2019-03-30 DIAGNOSIS — E118 Type 2 diabetes mellitus with unspecified complications: Secondary | ICD-10-CM

## 2019-03-30 DIAGNOSIS — R040 Epistaxis: Secondary | ICD-10-CM

## 2019-03-30 DIAGNOSIS — C61 Malignant neoplasm of prostate: Secondary | ICD-10-CM

## 2019-03-30 DIAGNOSIS — I2699 Other pulmonary embolism without acute cor pulmonale: Secondary | ICD-10-CM

## 2019-03-30 DIAGNOSIS — I2510 Atherosclerotic heart disease of native coronary artery without angina pectoris: Secondary | ICD-10-CM

## 2019-03-30 LAB — CBC
HCT: 32.4 % — ABNORMAL LOW (ref 39.0–52.0)
Hemoglobin: 9.9 g/dL — ABNORMAL LOW (ref 13.0–17.0)
MCH: 26.7 pg (ref 26.0–34.0)
MCHC: 30.6 g/dL (ref 30.0–36.0)
MCV: 87.3 fL (ref 80.0–100.0)
Platelets: 315 10*3/uL (ref 150–400)
RBC: 3.71 MIL/uL — ABNORMAL LOW (ref 4.22–5.81)
RDW: 20.6 % — ABNORMAL HIGH (ref 11.5–15.5)
WBC: 7.3 10*3/uL (ref 4.0–10.5)
nRBC: 1.8 % — ABNORMAL HIGH (ref 0.0–0.2)

## 2019-03-30 LAB — GLUCOSE, CAPILLARY
Glucose-Capillary: 126 mg/dL — ABNORMAL HIGH (ref 70–99)
Glucose-Capillary: 150 mg/dL — ABNORMAL HIGH (ref 70–99)

## 2019-03-30 MED ORDER — ALTEPLASE 2 MG IJ SOLR
2.0000 mg | Freq: Once | INTRAMUSCULAR | Status: AC
  Administered 2019-03-30: 12:00:00 2 mg
  Filled 2019-03-30: qty 2

## 2019-03-30 MED ORDER — DIPHENOXYLATE-ATROPINE 2.5-0.025 MG PO TABS: 1.0000 | ORAL_TABLET | Freq: Four times a day (QID) | ORAL | 1 refills | Status: AC | PRN

## 2019-03-30 MED ORDER — ONDANSETRON HCL 4 MG PO TABS: 4.0000 mg | ORAL_TABLET | Freq: Every day | ORAL | 1 refills | Status: AC | PRN

## 2019-03-30 MED ORDER — HEPARIN SOD (PORK) LOCK FLUSH 100 UNIT/ML IV SOLN
500.0000 [IU] | Freq: Once | INTRAVENOUS | Status: AC
  Administered 2019-03-30: 14:00:00 500 [IU] via INTRAVENOUS

## 2019-03-30 NOTE — Progress Notes (Signed)
IP PROGRESS NOTE  Subjective:   Mr. Pruett feels reasonably well without any complaints at this time.  He is eating his breakfast which she has tolerated without any nausea or vomiting.  He denies any diarrhea or bleeding.  He is ambulated to the bathroom without any difficulties.  He denies any falls.  He denies any hematochezia or melena.  His fecal occult testing has been negative.   Objective:  Vital signs in last 24 hours: Temp:  [98.3 F (36.8 C)] 98.3 F (36.8 C) (05/26 0450) Pulse Rate:  [85-96] 96 (05/26 0450) Resp:  [16] 16 (05/26 0450) BP: (106-114)/(58-70) 114/70 (05/26 0450) SpO2:  [98 %-99 %] 98 % (05/26 0450) Weight change:  Last BM Date: 03/29/19  Intake/Output from previous day: 05/25 0701 - 05/26 0700 In: 1200 [P.O.:1140; I.V.:60] Out: 0        Lab Results: Recent Labs    03/28/19 1640 03/29/19 0447  WBC 1.3* 2.5*  HGB 8.9* 8.7*  HCT 29.0* 28.9*  PLT 236 241    BMET Recent Labs    03/28/19 1640 03/29/19 0447  NA 137 138  K 3.4* 3.1*  CL 111 110  CO2 21* 20*  GLUCOSE 210* 194*  BUN 9 8  CREATININE 0.73 0.74  CALCIUM 7.6* 7.4*    Studies/Results: Dg Chest Portable 1 View  Result Date: 03/28/2019 CLINICAL DATA:  Shortness of breath. Emesis, diarrhea, and epistaxis. EXAM: PORTABLE CHEST 1 VIEW COMPARISON:  Chest radiographs and CTA 03/22/2019 FINDINGS: A right jugular Port-A-Cath terminates over the lower SVC, unchanged. The cardiomediastinal silhouette is unchanged with normal heart size. 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 Left upper lobe nodular densities on CT are partially obscured radiographically due to innumerable sclerotic bone metastases including involvement of the ribs and scapulae which result in bone lesions projecting over the lungs. Mildly increased patchy airspace opacity is questioned in the left mid lung. No sizable pleural effusion or pneumothorax is identified. IMPRESSION: 1. Questionable new/increased infiltrate in  the left midlung. 2. Diffuse sclerotic bone metastases. Electronically Signed   By: Logan Bores M.D.   On: 03/28/2019 18:12    Medications: I have reviewed the patient's current medications.  Assessment/Plan:  78 year old with:  1.  Castration-resistant advanced prostate cancer with disease to the bone.  Chemotherapy is on hold and will discuss this as an outpatient whether will receive any further chemotherapy given the multitude of complications.  2.  Pulmonary embolism: Eliquis has been on hold since his hospitalization.  If no active bleeding is noted especially with a negative fecal occult testing have no objection to resuming Eliquis on discharge.   3.  Neutropenia: Related to chemotherapy and has resolved at this time.  4.  Disposition: I have no objection to discharge once it felt cleared by the primary team.  25  minutes was spent with the patient face-to-face today.  More than 50% of time was dedicated to discussing his disease status, imaging studies as well as complications associated with his therapy.    LOS: 2 days   Zola Button 03/30/2019, 7:41 AM

## 2019-03-30 NOTE — Telephone Encounter (Signed)
Transition Care Management Follow-up Telephone Call   Date discharged?03/30/2019  PCP: System, Pcp Not In  Admit date: 03/28/2019 Discharge date: 03/30/2019  Admitted From: Home  Disposition:  Home   Recommendations for Outpatient Follow-up:  2. Follow up with Oncology, Dr. Alen Blew in 1 week 3. Dr. Alen Blew: Please discuss dosing of Eliquis with patient (2.5 mg vs 5 mg)  4. Dr. Alen Blew: Please obtain CBC in one week -- if trending down again, please assist patient to obtain FOBT testing of 3 stools as outpatient    How have you been since you were released from the hospital? good   Do you understand why you were in the hospital? yes   Do you understand the discharge instructions? yes   Where were you discharged to? home   Items Reviewed:  Medications reviewed: yes  Allergies reviewed: yes  Dietary changes reviewed: yes Referrals reviewed: yes - possible referral to cardiology and urology   Functional Questionnaire:   Activities of Daily Living (ADLs):   He states they are independent in the following: none States they require assistance with the following: none   Any transportation issues/concerns?: no   Any patient concerns? no   Confirmed importance and date/time of follow-up visits scheduled yes  Provider Appointment booked with Dr Jerilee Hoh   Confirmed with patient if condition begins to worsen call PCP or go to the ER.  Patient was given the office number and encouraged to call back with question or concerns.  : yes

## 2019-03-30 NOTE — Discharge Summary (Addendum)
Physician Discharge Summary  Michael Frey SNK:[id number] DOB: [birth year] DOA: 03/28/2019  PCP: System, Pcp Not In  Admit date: 03/28/2019 Discharge date: 03/30/2019  Admitted From: Home  Disposition:  Home   Recommendations for Outpatient Follow-up:  1. Follow up with Oncology, Dr. Alen Frey in 1 week 2. Dr. Alen Frey: Please discuss dosing of Eliquis with patient (2.5 mg vs 5 mg)  3. Dr. Alen Frey: Please obtain CBC in one week -- if trending down again, please assist patient to obtain FOBT testing of 3 stools as outpatient     Home Health: None  Equipment/Devices: None  Discharge Condition: Fair  CODE STATUS: FULL Diet recommendation: Diabetic  Brief/Interim Summary: Michael Frey is a 78 y.o. M with hx castration resistant metastatic prostate cancer, DM, HTN, and recent PE on Eliquis who presented with a recurrent vomiting episode that led to epistaxis that was severe, and wouldn't stop and so he came to the ER.    In ER, FOBT negative, no melena.  Hgb 8.9 g/dL.  Admitted to hospitalist service for tachycardia.      PRINCIPAL HOSPITAL DIAGNOSIS:  Vomiting, anemia from chemotherapy    Discharge Diagnoses:   Anemia of chemotherapy Possible acute blood loss anemia from Epistaxis Doubt GI bleeding The patient had negative FOBT here.  No melena in several stools overnight.  He did report a "black, tarry, asphalt" like stool once before admission, but none since.  Hgb trended here for 48 hours and normal. -Resume Eliquis -Oncology to repeat Hgb and refer to GI if trending down   Leukopenia From chemo  PE Eliquis held in hospital.  Resume Eliqius at discharge.  Prostate cancer with metastasis, castration resistant  Doubt pneumonia Started on empiric antibiotics at admission due to abnormal CXR.  D/c'd yesterday.  No new fever, cough, sputum, respiratory distress.  No antibiotics necessary at discharge.   Diabetes Glucose good.  Hypertension BP and HR normal            Discharge Instructions  Discharge Instructions    Discharge instructions   Complete by:  As directed    From Dr. Loleta Frey: You were admitted with vomiting, a nosebleed, and weakness.  Your blood levels were somewhat low, possibly from your chemotherapy, possibly from the nosebleed.   Here int he hospital, they were stable for 3 days, and we saw no evidence of bleeding here (and your bowel movement tested negative for blood)  Restart your Eliquis 5 mg twice daily Discuss the dosing with Dr. Alen Frey, and whether you should reduce the dose  If you notice any black and tarry bowel movements, call Dr. Hazeline Frey office asap. Have him check your blood levels in 1 week    For the diarrhea and nausea: I have sent refills for ondansetron/Zofran (a nausea medicine, to be taken as needed for nausea) and Lomotil/diphenoxylate for diarrhea  If you take the Lomotil at maximum doses and it is still ineffective, call Dr. Hazeline Frey office   For the weight loss: Our nutritionist didn't come to see you, but I agree with the nutritionist on the phone the other week. Take Ensure or Boost, twice daily between meals. Maybe take a Zofran first. And if you don't like those, go get a milkshake from [facility] or [employer]!   Increase activity slowly   Complete by:  As directed      Allergies as of 03/30/2019      Reactions   Naproxen       Medication List    TAKE these  medications   acetaminophen 500 MG tablet Commonly known as:  TYLENOL Take 500 mg by mouth every 6 (six) hours as needed (2 tabs as needed for pain).   apixaban 5 MG Tabs tablet Commonly known as:  ELIQUIS Take 1 tablet (5 mg total) by mouth 2 (two) times daily for 30 days. What changed:  Another medication with the same name was removed. Continue taking this medication, and follow the directions you see here.   chlorhexidine 0.12 % solution Commonly known as:  PERIDEX 5 mLs by Mouth Rinse route 3 (three) times  daily.   diphenoxylate-atropine 2.5-0.025 MG tablet Commonly known as:  Lomotil Take 1 tablet by mouth 4 (four) times daily as needed for diarrhea or loose stools.   fluconazole 200 MG tablet Commonly known as:  DIFLUCAN Take 200 mg (1 tablet) twice daily at 12 noon and 4pm.   metFORMIN 1000 MG tablet Commonly known as:  GLUCOPHAGE Take 1,000 mg by mouth 2 (two) times daily with a meal.   metoprolol succinate 25 MG 24 hr tablet Commonly known as:  TOPROL-XL Take 25 mg by mouth daily.   nitroGLYCERIN 0.4 MG SL tablet Commonly known as:  NITROSTAT Place 0.4 mg under the tongue every 5 (five) minutes as needed for chest pain.   ondansetron 4 MG tablet Commonly known as:  Zofran Take 1 tablet (4 mg total) by mouth daily as needed for nausea or vomiting.   prochlorperazine 10 MG tablet Commonly known as:  COMPAZINE Take 1 tablet (10 mg total) by mouth every 6 (six) hours as needed for nausea or vomiting.   rosuvastatin 10 MG tablet Commonly known as:  CRESTOR Take 10 mg by mouth daily.   Toujeo SoloStar 300 UNIT/ML Sopn Generic drug:  Insulin Glargine (1 Unit Dial) Inject 15 Units into the skin 2 (two) times daily.       Allergies  Allergen Reactions  . Naproxen     Consultations:  Oncology   Procedures/Studies: Dg Chest 2 View  Result Date: 03/22/2019 CLINICAL DATA:  78 year old male with shortness of breath and fatigue EXAM: CHEST - 2 VIEW COMPARISON:  Chest x-ray 02/26/2019, CT 02/09/2019 FINDINGS: Cardiomediastinal silhouette unchanged in size and contour. Surgical changes of median sternotomy and CABG. Right IJ port catheter is unchanged. Sclerotic foci of the visualized appendicular and axial skeleton, similar to the comparison CT and plain film. No pneumothorax or pleural effusion.  No confluent airspace disease. Lung nodules are better characterized on recent CT imaging, not visualized on the current study. IMPRESSION: Negative for acute cardiopulmonary  disease. Redemonstration of sclerotic metastases of the visualized axial and appendicular skeleton. Right IJ port catheter. Surgical changes of median sternotomy and CABG. Electronically Signed   By: Corrie Mckusick D.O.   On: 03/22/2019 16:11   Ct Angio Chest Pe W And/or Wo Contrast  Result Date: 03/22/2019 CLINICAL DATA:  LEFT chest pain and shortness of breath since 1300 hours. Hyperglycemia and dizziness. EXAM: CT ANGIOGRAPHY CHEST WITH CONTRAST TECHNIQUE: Multidetector CT imaging of the chest was performed using the standard protocol during bolus administration of intravenous contrast. Multiplanar CT image reconstructions and MIPs were obtained to evaluate the vascular anatomy. CONTRAST:  59mL OMNIPAQUE IOHEXOL 350 MG/ML SOLN COMPARISON:  Chest x-ray earlier today. CT chest 02/09/2019 FINDINGS: Cardiovascular: Satisfactory opacification of the pulmonary arteries to the segmental level. BILATERAL pulmonary lower lobe emboli are present, more extensive on the RIGHT. No signs of RIGHT heart strain. Extensive three-vessel coronary artery calcification. Median sternotomy for  CABG. RIGHT chest port catheter. Aortic atherosclerosis. Normal heart size. No pericardial effusion. Mediastinum/Nodes: No enlarged mediastinal, hilar, or axillary lymph nodes. Thyroid gland, trachea, and esophagus demonstrate no significant findings. Lungs/Pleura: Redemonstrated are numerous small pulmonary nodules, most conspicuous in the LEFT upper lobe and superior segment RIGHT lower lobe, with some cavitation in the LEFT upper lobe. No pleural effusion or pneumothorax. These appear slightly improved compared with priors. Upper Abdomen: No acute abnormality. Musculoskeletal: Innumerable sclerotic osseous lesions, nonacute appearing RIGHT-sided rib fractures. Review of the MIP images confirms the above findings. IMPRESSION: 1. BILATERAL pulmonary emboli, greater on the RIGHT. 2. Widespread osseous metastatic disease, secondary to prostate  cancer. 3. BILATERAL pulmonary nodules, appears slightly improved compared with priors. Atypical infection is favored. 4. These results were called by telephone at the time of interpretation on 03/22/2019 at 7:05 pm to Dr. Dorie Rank , who verbally acknowledged these results. Aortic Atherosclerosis (ICD10-I70.0). Electronically Signed   By: Staci Righter M.D.   On: 03/22/2019 19:10   Dg Chest Portable 1 View  Result Date: 03/28/2019 CLINICAL DATA:  Shortness of breath. Emesis, diarrhea, and epistaxis. EXAM: PORTABLE CHEST 1 VIEW COMPARISON:  Chest radiographs and CTA 03/22/2019 FINDINGS: A right jugular Port-A-Cath terminates over the lower SVC, unchanged. The cardiomediastinal silhouette is unchanged with normal heart size. 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 Left upper lobe nodular densities on CT are partially obscured radiographically due to innumerable sclerotic bone metastases including involvement of the ribs and scapulae which result in bone lesions projecting over the lungs. Mildly increased patchy airspace opacity is questioned in the left mid lung. No sizable pleural effusion or pneumothorax is identified. IMPRESSION: 1. Questionable new/increased infiltrate in the left midlung. 2. Diffuse sclerotic bone metastases. Electronically Signed   By: Logan Bores M.D.   On: 03/28/2019 18:12   Vas [place] Lower Extremity Venous (dvt)  Result Date: 03/23/2019  Lower Venous Study Indications: Pulmonary embolism.  Performing Technologist: Abram Sander RVS  Examination Guidelines: A complete evaluation includes B-mode imaging, spectral Doppler, color Doppler, and power Doppler as needed of all accessible portions of each vessel. Bilateral testing is considered an integral part of a complete examination. Limited examinations for reoccurring indications may be performed as noted.  +---------+---------------+---------+-----------+----------+-------+ RIGHT     CompressibilityPhasicitySpontaneityPropertiesSummary +---------+---------------+---------+-----------+----------+-------+ CFV      Full           Yes      Yes                          +---------+---------------+---------+-----------+----------+-------+ SFJ      Full                                                 +---------+---------------+---------+-----------+----------+-------+ FV Prox  Full                                                 +---------+---------------+---------+-----------+----------+-------+ FV Mid   Full                                                 +---------+---------------+---------+-----------+----------+-------+  FV DistalFull                                                 +---------+---------------+---------+-----------+----------+-------+ PFV      Full                                                 +---------+---------------+---------+-----------+----------+-------+ POP      Full           Yes      Yes                          +---------+---------------+---------+-----------+----------+-------+ PTV      Full                                                 +---------+---------------+---------+-----------+----------+-------+ PERO     Full                                                 +---------+---------------+---------+-----------+----------+-------+   +---------+---------------+---------+-----------+----------+--------------+ LEFT     CompressibilityPhasicitySpontaneityPropertiesSummary        +---------+---------------+---------+-----------+----------+--------------+ CFV      Full           Yes      Yes                                 +---------+---------------+---------+-----------+----------+--------------+ SFJ      Full                                                        +---------+---------------+---------+-----------+----------+--------------+ FV Prox  Full                                                         +---------+---------------+---------+-----------+----------+--------------+ FV Mid   Full                                                        +---------+---------------+---------+-----------+----------+--------------+ FV DistalFull                                                        +---------+---------------+---------+-----------+----------+--------------+ PFV      Full                                                        +---------+---------------+---------+-----------+----------+--------------+  POP      Full           Yes      Yes                                 +---------+---------------+---------+-----------+----------+--------------+ PTV      Full                                                        +---------+---------------+---------+-----------+----------+--------------+ PERO                                                  Not visualized +---------+---------------+---------+-----------+----------+--------------+     Summary: Right: There is no evidence of deep vein thrombosis in the lower extremity. No cystic structure found in the popliteal fossa. Left: There is no evidence of deep vein thrombosis in the lower extremity. No cystic structure found in the popliteal fossa.  *See table(s) above for measurements and observations. Electronically signed by Harold Barban MD on 03/23/2019 at 2:51:14 PM.    Final        Subjective: Feels well.  No complaints. No more epistaxis.  No melena, hematochezia.  No vomiting.  Fatigue improved.  Discharge Exam: Vitals:   03/29/19 2019 03/30/19 0450  BP: (!) 110/58 114/70  Pulse: 85 96  Resp: 16 16  Temp: 98.3 F (36.8 C) 98.3 F (36.8 C)  SpO2: 99% 98%   Vitals:   03/29/19 0533 03/29/19 1313 03/29/19 2019 03/30/19 0450  BP: 110/65 (!) 106/58 (!) 110/58 114/70  Pulse: 93 85 85 96  Resp: 18  16 16   Temp: 99.7 F (37.6 C) 98.3 F (36.8 C) 98.3 F (36.8 C) 98.3 F (36.8  C)  TempSrc: Oral Oral    SpO2: 96% 99% 99% 98%  Weight:      Height:        General: Pt is alert, awake, not in acute distress, dentures in place, OP moist Cardiovascular: RRR, nl S1-S2, no murmurs appreciated.   No LE edema.   Respiratory: Normal respiratory rate and rhythm.  CTAB without rales or wheezes. Abdominal: Abdomen soft and non-tender.  No distension or HSM.   Neuro/Psych: Strength symmetric in upper and lower extremities.  Judgment and insight appear normal.   The results of significant diagnostics from this hospitalization (including imaging, microbiology, ancillary and laboratory) are listed below for reference.     Microbiology: Recent Results (from the past 240 hour(s))  SARS Coronavirus 2 (CEPHEID - Performed in [facility] hospital lab), Hosp Order     Status: None   Collection Time: 03/22/19  4:40 PM  Result Value Ref Range Status   SARS Coronavirus 2 NEGATIVE NEGATIVE Final    Comment: (NOTE) If result is NEGATIVE SARS-CoV-2 target nucleic acids are NOT DETECTED. The SARS-CoV-2 RNA is generally detectable in upper and lower  respiratory specimens during the acute phase of infection. The lowest  concentration of SARS-CoV-2 viral copies this assay can detect is 250  copies / mL. A negative result does not preclude SARS-CoV-2 infection  and should not be used as the sole basis for treatment or  other  patient management decisions.  A negative result may occur with  improper specimen collection / handling, submission of specimen other  than nasopharyngeal swab, presence of viral mutation(s) within the  areas targeted by this assay, and inadequate number of viral copies  (<250 copies / mL). A negative result must be combined with clinical  observations, patient history, and epidemiological information. If result is POSITIVE SARS-CoV-2 target nucleic acids are DETECTED. The SARS-CoV-2 RNA is generally detectable in upper and lower  respiratory specimens dur ing  the acute phase of infection.  Positive  results are indicative of active infection with SARS-CoV-2.  Clinical  correlation with patient history and other diagnostic information is  necessary to determine patient infection status.  Positive results do  not rule out bacterial infection or co-infection with other viruses. If result is PRESUMPTIVE POSTIVE SARS-CoV-2 nucleic acids MAY BE PRESENT.   A presumptive positive result was obtained on the submitted specimen  and confirmed on repeat testing.  While 2019 novel coronavirus  (SARS-CoV-2) nucleic acids may be present in the submitted sample  additional confirmatory testing may be necessary for epidemiological  and / or clinical management purposes  to differentiate between  SARS-CoV-2 and other Sarbecovirus currently known to infect humans.  If clinically indicated additional testing with an alternate test  methodology [phone]) is advised. The SARS-CoV-2 RNA is generally  detectable in upper and lower respiratory sp ecimens during the acute  phase of infection. The expected result is Negative. Fact Sheet for Patients:  StrictlyIdeas.no Fact Sheet for Healthcare Providers: BankingDealers.co.za This test is not yet approved or cleared by the [place] FDA and has been authorized for detection and/or diagnosis of SARS-CoV-2 by FDA under an Emergency Use Authorization (EUA).  This EUA will remain in effect (meaning this test can be used) for the duration of the COVID-19 declaration under Section 564(b)(1) of the Act, 21 U.S.C. section 360bbb-3(b)(1), unless the authorization is terminated or revoked sooner. Performed at [facility], [place] [address] [place], [place] [address]   MRSA PCR Screening     Status: None   Collection Time: 03/22/19 10:36 PM  Result Value Ref Range Status   MRSA by PCR NEGATIVE NEGATIVE Final    Comment:        The GeneXpert MRSA Assay  (FDA approved for NASAL specimens only), is one component of a comprehensive MRSA colonization surveillance program. It is not intended to diagnose MRSA infection nor to guide or monitor treatment for MRSA infections. Performed at [facility], [place] [address] [place], [place] [address]   SARS Coronavirus 2 (CEPHEID - Performed in [place] hospital lab), Hosp Order     Status: None   Collection Time: 03/28/19  4:55 PM  Result Value Ref Range Status   SARS Coronavirus 2 NEGATIVE NEGATIVE Final    Comment: (NOTE) If result is NEGATIVE SARS-CoV-2 target nucleic acids are NOT DETECTED. The SARS-CoV-2 RNA is generally detectable in upper and lower  respiratory specimens during the acute phase of infection. The lowest  concentration of SARS-CoV-2 viral copies this assay can detect is 250  copies / mL. A negative result does not preclude SARS-CoV-2 infection  and should not be used as the sole basis for treatment or other  patient management decisions.  A negative result may occur with  improper specimen collection / handling, submission of specimen other  than nasopharyngeal swab, presence of viral mutation(s) within the  areas targeted by this assay, and  inadequate number of viral copies  (<250 copies / mL). A negative result must be combined with clinical  observations, patient history, and epidemiological information. If result is POSITIVE SARS-CoV-2 target nucleic acids are DETECTED. The SARS-CoV-2 RNA is generally detectable in upper and lower  respiratory specimens dur ing the acute phase of infection.  Positive  results are indicative of active infection with SARS-CoV-2.  Clinical  correlation with patient history and other diagnostic information is  necessary to determine patient infection status.  Positive results do  not rule out bacterial infection or co-infection with other viruses. If result is PRESUMPTIVE POSTIVE SARS-CoV-2 nucleic acids MAY BE  PRESENT.   A presumptive positive result was obtained on the submitted specimen  and confirmed on repeat testing.  While 2019 novel coronavirus  (SARS-CoV-2) nucleic acids may be present in the submitted sample  additional confirmatory testing may be necessary for epidemiological  and / or clinical management purposes  to differentiate between  SARS-CoV-2 and other Sarbecovirus currently known to infect humans.  If clinically indicated additional testing with an alternate test  methodology [phone]) is advised. The SARS-CoV-2 RNA is generally  detectable in upper and lower respiratory sp ecimens during the acute  phase of infection. The expected result is Negative. Fact Sheet for Patients:  StrictlyIdeas.no Fact Sheet for Healthcare Providers: BankingDealers.co.za This test is not yet approved or cleared by the [place] FDA and has been authorized for detection and/or diagnosis of SARS-CoV-2 by FDA under an Emergency Use Authorization (EUA).  This EUA will remain in effect (meaning this test can be used) for the duration of the COVID-19 declaration under Section 564(b)(1) of the Act, 21 U.S.C. section 360bbb-3(b)(1), unless the authorization is terminated or revoked sooner. Performed at [facility], [place] [address] [place], [place] [address]   Blood culture (routine x 2)     Status: None (Preliminary result)   Collection Time: 03/28/19  7:26 PM  Result Value Ref Range Status   Specimen Description   Final    BLOOD BLOOD LEFT FOREARM Performed at [facility] [address], [place], [place] [address]    Special Requests   Final    BOTTLES DRAWN AEROBIC ONLY Blood Culture adequate volume Performed at [facility] [address], [place], [place] [address]    Culture   Final    NO GROWTH 2 DAYS Performed at [facility] [address] [place], [place] [address]     Report Status PENDING  Incomplete  Blood culture (routine x 2)     Status: None (Preliminary result)   Collection Time: 03/28/19  7:26 PM  Result Value Ref Range Status   Specimen Description   Final    BLOOD LEFT ANTECUBITAL Performed at [facility] [address] [place], [place] [address]    Special Requests   Final    BOTTLES DRAWN AEROBIC ONLY Blood Culture results may not be optimal due to an inadequate volume of blood received in culture bottles Performed at [facility] [address] [place], [place] [address]    Culture   Final    NO GROWTH 2 DAYS Performed at [facility] [address] [place], [place] [address]    Report Status PENDING  Incomplete     Labs: BNP (last 3 results) Recent Labs    03/22/19 1559  BNP 967.8*   Basic Metabolic Panel: Recent Labs  Lab 03/24/19 0500  03/28/19 1640 03/29/19 0447  NA 137 137 138  K 3.3* 3.4* 3.1*  CL 103 111 110  CO2 27 21* 20*  GLUCOSE 99 210* 194*  BUN 14 9 8   CREATININE 0.64 0.73 0.74  CALCIUM 8.6* 7.6* 7.4*  MG 2.1  --   --    Liver Function Tests: Recent Labs  Lab 03/28/19 1640  AST 16  ALT 15  ALKPHOS 56  BILITOT 0.4  PROT 5.5*  ALBUMIN 2.4*   No results for input(s): LIPASE, AMYLASE in the last 168 hours. No results for input(s): AMMONIA in the last 168 hours. CBC: Recent Labs  Lab 03/24/19 0500 03/28/19 1640 03/29/19 0447 03/30/19 1151  WBC 17.2* 1.3* 2.5* 7.3  NEUTROABS  --  0.5* 1.1*  --   HGB 10.4* 8.9* 8.7* 9.9*  HCT 34.8* 29.0* 28.9* 32.4*  MCV 85.9 86.6 87.0 87.3  PLT 215 236 241 315   Cardiac Enzymes: No results for input(s): CKTOTAL, CKMB, CKMBINDEX, TROPONINI in the last 168 hours. BNP: Invalid input(s): POCBNP CBG: Recent Labs  Lab 03/29/19 1146 03/29/19 1614 03/29/19 2022 03/30/19 0717 03/30/19 1120  GLUCAP 161* 117* 204* 126* 150*   D-Dimer No results for input(s): DDIMER in the last 72 hours. Hgb A1c No  results for input(s): HGBA1C in the last 72 hours. Lipid Profile No results for input(s): CHOL, HDL, LDLCALC, TRIG, CHOLHDL, LDLDIRECT in the last 72 hours. Thyroid function studies No results for input(s): TSH, T4TOTAL, T3FREE, THYROIDAB in the last 72 hours.  Invalid input(s): FREET3 Anemia work up No results for input(s): VITAMINB12, FOLATE, FERRITIN, TIBC, IRON, RETICCTPCT in the last 72 hours. Urinalysis No results found for: COLORURINE, APPEARANCEUR, [place], [place], [place], [place], [place], [place], PROTEINUR, UROBILINOGEN, NITRITE, LEUKOCYTESUR Sepsis Labs Invalid input(s): PROCALCITONIN,  WBC,  LACTICIDVEN Microbiology Recent Results (from the past 240 hour(s))  SARS Coronavirus 2 (CEPHEID - Performed in [place] hospital lab), Hosp Order     Status: None   Collection Time: 03/22/19  4:40 PM  Result Value Ref Range Status   SARS Coronavirus 2 NEGATIVE NEGATIVE Final    Comment: (NOTE) If result is NEGATIVE SARS-CoV-2 target nucleic acids are NOT DETECTED. The SARS-CoV-2 RNA is generally detectable in upper and lower  respiratory specimens during the acute phase of infection. The lowest  concentration of SARS-CoV-2 viral copies this assay can detect is 250  copies / mL. A negative result does not preclude SARS-CoV-2 infection  and should not be used as the sole basis for treatment or other  patient management decisions.  A negative result may occur with  improper specimen collection / handling, submission of specimen other  than nasopharyngeal swab, presence of viral mutation(s) within the  areas targeted by this assay, and inadequate number of viral copies  (<250 copies / mL). A negative result must be combined with clinical  observations, patient history, and epidemiological information. If result is POSITIVE SARS-CoV-2 target nucleic acids are DETECTED. The SARS-CoV-2 RNA is generally detectable in upper and lower  respiratory specimens dur ing the acute  phase of infection.  Positive  results are indicative of active infection with SARS-CoV-2.  Clinical  correlation with patient history and other diagnostic information is  necessary to determine patient infection status.  Positive results do  not rule out bacterial infection or co-infection with other viruses. If result is PRESUMPTIVE POSTIVE SARS-CoV-2 nucleic acids MAY BE PRESENT.   A presumptive positive result was obtained on the submitted specimen  and confirmed on repeat  testing.  While 2019 novel coronavirus  (SARS-CoV-2) nucleic acids may be present in the submitted sample  additional confirmatory testing may be necessary for epidemiological  and / or clinical management purposes  to differentiate between  SARS-CoV-2 and other Sarbecovirus currently known to infect humans.  If clinically indicated additional testing with an alternate test  methodology [phone]) is advised. The SARS-CoV-2 RNA is generally  detectable in upper and lower respiratory sp ecimens during the acute  phase of infection. The expected result is Negative. Fact Sheet for Patients:  StrictlyIdeas.no Fact Sheet for Healthcare Providers: BankingDealers.co.za This test is not yet approved or cleared by the [place] FDA and has been authorized for detection and/or diagnosis of SARS-CoV-2 by FDA under an Emergency Use Authorization (EUA).  This EUA will remain in effect (meaning this test can be used) for the duration of the COVID-19 declaration under Section 564(b)(1) of the Act, 21 U.S.C. section 360bbb-3(b)(1), unless the authorization is terminated or revoked sooner. Performed at [facility], [place] [address], [place], [place] [address]   MRSA PCR Screening     Status: None   Collection Time: 03/22/19 10:36 PM  Result Value Ref Range Status   MRSA by PCR NEGATIVE NEGATIVE Final    Comment:        The GeneXpert MRSA Assay (FDA approved  for NASAL specimens only), is one component of a comprehensive MRSA colonization surveillance program. It is not intended to diagnose MRSA infection nor to guide or monitor treatment for MRSA infections. Performed at [facility], [place] [address], [place], [place] [address]   SARS Coronavirus 2 (CEPHEID - Performed in [facility] hospital lab), Hosp Order     Status: None   Collection Time: 03/28/19  4:55 PM  Result Value Ref Range Status   SARS Coronavirus 2 NEGATIVE NEGATIVE Final    Comment: (NOTE) If result is NEGATIVE SARS-CoV-2 target nucleic acids are NOT DETECTED. The SARS-CoV-2 RNA is generally detectable in upper and lower  respiratory specimens during the acute phase of infection. The lowest  concentration of SARS-CoV-2 viral copies this assay can detect is 250  copies / mL. A negative result does not preclude SARS-CoV-2 infection  and should not be used as the sole basis for treatment or other  patient management decisions.  A negative result may occur with  improper specimen collection / handling, submission of specimen other  than nasopharyngeal swab, presence of viral mutation(s) within the  areas targeted by this assay, and inadequate number of viral copies  (<250 copies / mL). A negative result must be combined with clinical  observations, patient history, and epidemiological information. If result is POSITIVE SARS-CoV-2 target nucleic acids are DETECTED. The SARS-CoV-2 RNA is generally detectable in upper and lower  respiratory specimens dur ing the acute phase of infection.  Positive  results are indicative of active infection with SARS-CoV-2.  Clinical  correlation with patient history and other diagnostic information is  necessary to determine patient infection status.  Positive results do  not rule out bacterial infection or co-infection with other viruses. If result is PRESUMPTIVE POSTIVE SARS-CoV-2 nucleic acids MAY BE PRESENT.   A  presumptive positive result was obtained on the submitted specimen  and confirmed on repeat testing.  While 2019 novel coronavirus  (SARS-CoV-2) nucleic acids may be present in the submitted sample  additional confirmatory testing may be necessary for epidemiological  and / or clinical management purposes  to differentiate between  SARS-CoV-2  and other Sarbecovirus currently known to infect humans.  If clinically indicated additional testing with an alternate test  methodology [phone]) is advised. The SARS-CoV-2 RNA is generally  detectable in upper and lower respiratory sp ecimens during the acute  phase of infection. The expected result is Negative. Fact Sheet for Patients:  StrictlyIdeas.no Fact Sheet for Healthcare Providers: BankingDealers.co.za This test is not yet approved or cleared by the [place] FDA and has been authorized for detection and/or diagnosis of SARS-CoV-2 by FDA under an Emergency Use Authorization (EUA).  This EUA will remain in effect (meaning this test can be used) for the duration of the COVID-19 declaration under Section 564(b)(1) of the Act, 21 U.S.C. section 360bbb-3(b)(1), unless the authorization is terminated or revoked sooner. Performed at [facility], [place] [address], [place], [place] [address]   Blood culture (routine x 2)     Status: None (Preliminary result)   Collection Time: 03/28/19  7:26 PM  Result Value Ref Range Status   Specimen Description   Final    BLOOD BLOOD LEFT FOREARM Performed at [place] [address], [place], [place] [address]    Special Requests   Final    BOTTLES DRAWN AEROBIC ONLY Blood Culture adequate volume Performed at [facility] [address] [place], [place] [address]    Culture   Final    NO GROWTH 2 DAYS Performed at [facility] [address], [place], [place] [address]    Report  Status PENDING  Incomplete  Blood culture (routine x 2)     Status: None (Preliminary result)   Collection Time: 03/28/19  7:26 PM  Result Value Ref Range Status   Specimen Description   Final    BLOOD LEFT ANTECUBITAL Performed at [facility] [address] [place], [place] [address]    Special Requests   Final    BOTTLES DRAWN AEROBIC ONLY Blood Culture results may not be optimal due to an inadequate volume of blood received in culture bottles Performed at [facility] [address], [place], [place] [address]    Culture   Final    NO GROWTH 2 DAYS Performed at [facility] [address], [place], [place] [address]    Report Status PENDING  Incomplete     Time coordinating discharge: 25 minutes      SIGNED:   Edwin Dada, MD  [facility] 03/30/2019, 12:33 PM

## 2019-03-30 NOTE — Progress Notes (Signed)
[facility]   HEMATOLOGY/ONCOLOGY INPATIENT PROGRESS NOTE  Date of Service: 03/30/2019  Inpatient Attending: .Edwin Dada, *   SUBJECTIVE  Patient with metastatic prostate cancer followed by Dr Alen Blew currently on [place] with G-CSF support with recent PE on Eliquis admitted with nosebleed with neg fecal occult blood testing. Noted to have HCAP and started on borad spectrum antibiotics. Notes epistaxis has resolved. No SOB or fevers. Chemotherapy related neutropenia is resolving.Received Neulasta.    OBJECTIVE:  NAD  PHYSICAL EXAMINATION: . Vitals:   03/29/19 0533 03/29/19 1313 03/29/19 2019 03/30/19 0450  BP: 110/65 (!) 106/58 (!) 110/58 114/70  Pulse: 93 85 85 96  Resp: 18  16 16   Temp: 99.7 F (37.6 C) 98.3 F (36.8 C) 98.3 F (36.8 C) 98.3 F (36.8 C)  TempSrc: Oral Oral    SpO2: 96% 99% 99% 98%  Weight:      Height:       Filed Weights   03/28/19 2023 03/28/19 2219  Weight: 130 lb (59 kg) 132 lb 15 oz (60.3 kg)   .Body mass index is 20.21 kg/m.  GENERAL:alert, in no acute distress OROPHARYNX:no exudate, no erythema and lips NECK: supple, no JVD, thyroid normal size, non-tender, without nodularity LYMPH:  no palpable lymphadenopathy in the cervical, axillary or inguinal LUNGS: clear to auscultation with normal respiratory effort HEART: regular rate & rhythm, 1+ edema ABDOMEN: abdomen soft, non-tender, normoactive bowel sounds  PSYCH: alert & oriented x 3 with fluent speech NEURO: no focal motor/sensory deficits  MEDICAL HISTORY:  Past Medical History:  Diagnosis Date   Cancer ([place])    Diabetes mellitus without complication ([place])     SURGICAL HISTORY: Past Surgical History:  Procedure Laterality Date   CHOLECYSTECTOMY     paratial pancrectomy     PORTACATH PLACEMENT     SPLENECTOMY      SOCIAL HISTORY: Social History   Socioeconomic History   Marital status: Divorced    Spouse name: Not on file   Number of children: Not on file    Years of education: Not on file   Highest education level: Not on file  Occupational History   Not on file  Social Needs   Financial resource strain: Not on file   Food insecurity:    Worry: Not on file    Inability: Not on file   Transportation needs:    Medical: Not on file    Non-medical: Not on file  Tobacco Use   Smoking status: Never Smoker   Smokeless tobacco: Never Used  Substance and Sexual Activity   Alcohol use: Never    Frequency: Never   Drug use: Never   Sexual activity: Not on file  Lifestyle   Physical activity:    Days per week: Not on file    Minutes per session: Not on file   Stress: Not on file  Relationships   Social connections:    Talks on phone: Not on file    Gets together: Not on file    Attends religious service: Not on file    Active member of club or organization: Not on file    Attends meetings of clubs or organizations: Not on file    Relationship status: Not on file   Intimate partner violence:    Fear of current or ex partner: Not on file    Emotionally abused: Not on file    Physically abused: Not on file    Forced sexual activity: Not on file  Other  Topics Concern   Not on file  Social History Narrative   Not on file    FAMILY HISTORY: Family History  Problem Relation Age of Onset   Stroke Father    Hypertension Other     ALLERGIES:  is allergic to naproxen.  MEDICATIONS:  Scheduled Meds:  insulin aspart  0-9 Units Subcutaneous TID WC   insulin glargine  15 Units Subcutaneous BID   metoprolol succinate  25 mg Oral Daily   sodium chloride flush  3 mL Intravenous Q12H   Continuous Infusions:  sodium chloride 10 mL/hr at 03/29/19 0600   PRN Meds:.sodium chloride, prochlorperazine, sodium chloride flush, sodium chloride flush  REVIEW OF SYSTEMS:    10 Point review of Systems was done is negative except as noted above.   LABORATORY DATA:  I have reviewed the data as listed  . CBC Latest  Ref Rng & Units 03/29/2019 03/28/2019 03/24/2019  WBC 4.0 - 10.5 K/uL 2.5(L) 1.3(LL) 17.2(H)  Hemoglobin 13.0 - 17.0 g/dL 8.7(L) 8.9(L) 10.4(L)  Hematocrit 39.0 - 52.0 % 28.9(L) 29.0(L) 34.8(L)  Platelets 150 - 400 K/uL 241 236 215   ANC 1.1k up from 500 . CMP Latest Ref Rng & Units 03/29/2019 03/28/2019 03/24/2019  Glucose 70 - 99 mg/dL 194(H) 210(H) 99  BUN 8 - 23 mg/dL 8 9 14   Creatinine 0.61 - 1.24 mg/dL 0.74 0.73 0.64  Sodium 135 - 145 mmol/L 138 137 137  Potassium 3.5 - 5.1 mmol/L 3.1(L) 3.4(L) 3.3(L)  Chloride 98 - 111 mmol/L 110 111 103  CO2 22 - 32 mmol/L 20(L) 21(L) 27  Calcium 8.9 - 10.3 mg/dL 7.4(L) 7.6(L) 8.6(L)  Total Protein 6.5 - 8.1 g/dL - 5.5(L) -  Total Bilirubin 0.3 - 1.2 mg/dL - 0.4 -  Alkaline Phos 38 - 126 U/L - 56 -  AST 15 - 41 U/L - 16 -  ALT 0 - 44 U/L - 15 -     RADIOGRAPHIC STUDIES: I have personally reviewed the radiological images as listed and agreed with the findings in the report. Dg Chest 2 View  Result Date: 03/22/2019 CLINICAL DATA:  78 year old male with shortness of breath and fatigue EXAM: CHEST - 2 VIEW COMPARISON:  Chest x-ray 02/26/2019, CT 02/09/2019 FINDINGS: Cardiomediastinal silhouette unchanged in size and contour. Surgical changes of median sternotomy and CABG. Right IJ port catheter is unchanged. Sclerotic foci of the visualized appendicular and axial skeleton, similar to the comparison CT and plain film. No pneumothorax or pleural effusion.  No confluent airspace disease. Lung nodules are better characterized on recent CT imaging, not visualized on the current study. IMPRESSION: Negative for acute cardiopulmonary disease. Redemonstration of sclerotic metastases of the visualized axial and appendicular skeleton. Right IJ port catheter. Surgical changes of median sternotomy and CABG. Electronically Signed   By: Corrie Mckusick D.O.   On: 03/22/2019 16:11   Ct Angio Chest Pe W And/or Wo Contrast  Result Date: 03/22/2019 CLINICAL DATA:  LEFT  chest pain and shortness of breath since 1300 hours. Hyperglycemia and dizziness. EXAM: CT ANGIOGRAPHY CHEST WITH CONTRAST TECHNIQUE: Multidetector CT imaging of the chest was performed using the standard protocol during bolus administration of intravenous contrast. Multiplanar CT image reconstructions and MIPs were obtained to evaluate the vascular anatomy. CONTRAST:  62mL OMNIPAQUE IOHEXOL 350 MG/ML SOLN COMPARISON:  Chest x-ray earlier today. CT chest 02/09/2019 FINDINGS: Cardiovascular: Satisfactory opacification of the pulmonary arteries to the segmental level. BILATERAL pulmonary lower lobe emboli are present, more extensive on the RIGHT.  No signs of RIGHT heart strain. Extensive three-vessel coronary artery calcification. Median sternotomy for CABG. RIGHT chest port catheter. Aortic atherosclerosis. Normal heart size. No pericardial effusion. Mediastinum/Nodes: No enlarged mediastinal, hilar, or axillary lymph nodes. Thyroid gland, trachea, and esophagus demonstrate no significant findings. Lungs/Pleura: Redemonstrated are numerous small pulmonary nodules, most conspicuous in the LEFT upper lobe and superior segment RIGHT lower lobe, with some cavitation in the LEFT upper lobe. No pleural effusion or pneumothorax. These appear slightly improved compared with priors. Upper Abdomen: No acute abnormality. Musculoskeletal: Innumerable sclerotic osseous lesions, nonacute appearing RIGHT-sided rib fractures. Review of the MIP images confirms the above findings. IMPRESSION: 1. BILATERAL pulmonary emboli, greater on the RIGHT. 2. Widespread osseous metastatic disease, secondary to prostate cancer. 3. BILATERAL pulmonary nodules, appears slightly improved compared with priors. Atypical infection is favored. 4. These results were called by telephone at the time of interpretation on 03/22/2019 at 7:05 pm to Dr. Dorie Rank , who verbally acknowledged these results. Aortic Atherosclerosis (ICD10-I70.0). Electronically  Signed   By: Staci Righter M.D.   On: 03/22/2019 19:10   Dg Chest Portable 1 View  Result Date: 03/28/2019 CLINICAL DATA:  Shortness of breath. Emesis, diarrhea, and epistaxis. EXAM: PORTABLE CHEST 1 VIEW COMPARISON:  Chest radiographs and CTA 03/22/2019 FINDINGS: A right jugular Port-A-Cath terminates over the lower SVC, unchanged. The cardiomediastinal silhouette is unchanged with normal heart size. 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 Left upper lobe nodular densities on CT are partially obscured radiographically due to innumerable sclerotic bone metastases including involvement of the ribs and scapulae which result in bone lesions projecting over the lungs. Mildly increased patchy airspace opacity is questioned in the left mid lung. No sizable pleural effusion or pneumothorax is identified. IMPRESSION: 1. Questionable new/increased infiltrate in the left midlung. 2. Diffuse sclerotic bone metastases. Electronically Signed   By: Logan Bores M.D.   On: 03/28/2019 18:12   Vas [place] Lower Extremity Venous (dvt)  Result Date: 03/23/2019  Lower Venous Study Indications: Pulmonary embolism.  Performing Technologist: Abram Sander RVS  Examination Guidelines: A complete evaluation includes B-mode imaging, spectral Doppler, color Doppler, and power Doppler as needed of all accessible portions of each vessel. Bilateral testing is considered an integral part of a complete examination. Limited examinations for reoccurring indications may be performed as noted.  +---------+---------------+---------+-----------+----------+-------+  RIGHT     Compressibility Phasicity Spontaneity Properties Summary  +---------+---------------+---------+-----------+----------+-------+  CFV       Full            Yes       Yes                             +---------+---------------+---------+-----------+----------+-------+  SFJ       Full                                                       +---------+---------------+---------+-----------+----------+-------+  FV Prox   Full                                                      +---------+---------------+---------+-----------+----------+-------+  FV Mid    Full                                                      +---------+---------------+---------+-----------+----------+-------+  FV Distal Full                                                      +---------+---------------+---------+-----------+----------+-------+  PFV       Full                                                      +---------+---------------+---------+-----------+----------+-------+  POP       Full            Yes       Yes                             +---------+---------------+---------+-----------+----------+-------+  PTV       Full                                                      +---------+---------------+---------+-----------+----------+-------+  PERO      Full                                                      +---------+---------------+---------+-----------+----------+-------+   +---------+---------------+---------+-----------+----------+--------------+  LEFT      Compressibility Phasicity Spontaneity Properties Summary         +---------+---------------+---------+-----------+----------+--------------+  CFV       Full            Yes       Yes                                    +---------+---------------+---------+-----------+----------+--------------+  SFJ       Full                                                             +---------+---------------+---------+-----------+----------+--------------+  FV Prox   Full                                                             +---------+---------------+---------+-----------+----------+--------------+  FV Mid    Full                                                             +---------+---------------+---------+-----------+----------+--------------+  FV Distal Full                                                              +---------+---------------+---------+-----------+----------+--------------+  PFV       Full                                                             +---------+---------------+---------+-----------+----------+--------------+  POP       Full            Yes       Yes                                    +---------+---------------+---------+-----------+----------+--------------+  PTV       Full                                                             +---------+---------------+---------+-----------+----------+--------------+  PERO                                                       Not visualized  +---------+---------------+---------+-----------+----------+--------------+     Summary: Right: There is no evidence of deep vein thrombosis in the lower extremity. No cystic structure found in the popliteal fossa. Left: There is no evidence of deep vein thrombosis in the lower extremity. No cystic structure found in the popliteal fossa.  *See table(s) above for measurements and observations. Electronically signed by Harold Barban MD on 03/23/2019 at 2:51:14 PM.    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 2) Chemotherapy related neutropenia with HCAP 3) HCAP 4) Recent PE 5) Epistaxis resolved. Fecal occult blood negative -- monitor Hgb 6) h/o Cryptococcal pneumonia PLAN Chemotherapy on hold -neuropenia resolving - had received neulasta - no additional granix at this time -continue broad spectrum abx coverage for HCAP -continue fluconazole for cryptoccocus as per ID recommendations -further discussions regarding goals of care and future treatment plans with Dr Alen Blew. -monitor H&H. Given it stable - could restart prophylaxis with lovenox and transition to Eliquis on discharge. (without loading dose and consider 2.5mg  po BID given his age). -potassium replacement per hospitalist -appreciate hospitalist cares.   Sullivan Lone MD [place] AAHIVMS  [facility] Hematology/Oncology Physician [facility]

## 2019-03-30 NOTE — Progress Notes (Signed)
Went to draw labs from the patient's port and the patients port has no blood return. Jocelyn Lamer in Lab made aware and Allegra Lai made aware. Port will be TPA'd at this time.

## 2019-03-31 ENCOUNTER — Telehealth: Payer: Self-pay

## 2019-03-31 NOTE — Telephone Encounter (Signed)
-----   Message from Laurey Morale, MD sent at 03/30/2019  5:34 PM EDT ----- Regarding: RE: New Patient Sorry but I am too full, thanks  ----- Message ----- From: Beverlee Nims Sent: 03/30/2019   2:50 PM EDT To: Laurey Morale, MD Subject: New Patient                                    This patient would like to establish care with Dr. Sarajane Jews. He says his family sees Dr. Sarajane Jews. Is it okay to schedule a new patient appointment?   Contact patient at [phone]

## 2019-03-31 NOTE — Telephone Encounter (Signed)
I tried to call the patient with the number that was listed but the voice mail said that is was [employer]. I did not leave a message. That patient was wanting a new patient appointment with Dr. Sarajane Jews but his panel is too full already. He can establish with another one of our providers.

## 2019-04-01 ENCOUNTER — Other Ambulatory Visit: Payer: Self-pay | Admitting: Family Medicine

## 2019-04-01 ENCOUNTER — Ambulatory Visit: Payer: Self-pay | Admitting: Internal Medicine

## 2019-04-01 MED ORDER — FLUCONAZOLE 200 MG PO TABS: ORAL_TABLET | ORAL | 11 refills | Status: AC

## 2019-04-01 NOTE — Progress Notes (Signed)
Discussed with patient, to continue Diflucan per Dr. Henreitta Leber prescription.

## 2019-04-02 ENCOUNTER — Other Ambulatory Visit: Payer: Self-pay

## 2019-04-02 ENCOUNTER — Ambulatory Visit (INDEPENDENT_AMBULATORY_CARE_PROVIDER_SITE_OTHER): Payer: Medicare Other | Admitting: Internal Medicine

## 2019-04-02 DIAGNOSIS — I2699 Other pulmonary embolism without acute cor pulmonale: Secondary | ICD-10-CM | POA: Diagnosis not present

## 2019-04-02 DIAGNOSIS — C61 Malignant neoplasm of prostate: Secondary | ICD-10-CM

## 2019-04-02 DIAGNOSIS — R5381 Other malaise: Secondary | ICD-10-CM

## 2019-04-02 DIAGNOSIS — I10 Essential (primary) hypertension: Secondary | ICD-10-CM

## 2019-04-02 DIAGNOSIS — B450 Pulmonary cryptococcosis: Secondary | ICD-10-CM

## 2019-04-02 DIAGNOSIS — E118 Type 2 diabetes mellitus with unspecified complications: Secondary | ICD-10-CM

## 2019-04-02 DIAGNOSIS — I2510 Atherosclerotic heart disease of native coronary artery without angina pectoris: Secondary | ICD-10-CM

## 2019-04-02 LAB — CULTURE, BLOOD (ROUTINE X 2)
Culture: NO GROWTH
Culture: NO GROWTH
Special Requests: ADEQUATE

## 2019-04-02 NOTE — Progress Notes (Signed)
Virtual Visit via Video Note  I connected with Michael Frey on 04/02/19 at  1:30 PM EDT by a video enabled telemedicine application and verified that I am speaking with the correct person using two identifiers.  Location patient: home Location provider: work office Persons participating in the virtual visit: patient, provider, daughter Michael Frey who assists with history and technology  I discussed the limitations of evaluation and management by telemedicine and the availability of in person appointments. The patient expressed understanding and agreed to proceed.   HPI:  Michael Frey is an 78 y/o man who is coming in today for the above mentioned reasons, specifically transitional care services face-to-face visit.    Dates hospitalized: 5/24-5/26/20 Days since discharge from hospital: 3 Patient was discharged from the hospital to: home Reason for admission to hospital: epistaxis, recent PE Date of interactive phone contact with patient and/or caregiver: 03/30/19  I have reviewed in detail patient's discharge summary plus pertinent specific notes, labs, and images from the hospitalization.  He was admitted due to epistaxis. He was recently started on Eliquis for an acute pulmonary embolism. He has h/o prostate cancer and is currently undergoing chemo under Michael Frey. His eliquis was temporarily discontinued in hospital but was resumed on DC. There was initially some concern for PNA, but abx were discontinued on DC. He has done well since DC. Daughter states he remains very deconditioned and is requesting PT/OT referral.  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 HLD on crestor, no lipids on file.  He moved recently from [place] to be closer to family.  He feels very tired; even taking a shower is "an exhausting process".    Medication reconciliation was done today and patient is taking meds as recommended by discharging hospitalist/specialist. yes     ROS: Constitutional: Denies fever, chills, diaphoresis, appetite change and fatigue.  HEENT: Denies photophobia, eye pain, redness, hearing loss, ear pain, congestion, sore throat, rhinorrhea, sneezing, mouth sores, trouble swallowing, neck pain, neck stiffness and tinnitus.   Respiratory: Denies SOB, DOE, cough, chest tightness,  and wheezing.   Cardiovascular: Denies chest pain, palpitations and leg swelling.  Gastrointestinal: Denies nausea, vomiting, abdominal pain, diarrhea, constipation, blood in stool and abdominal distention.  Genitourinary: Denies dysuria, urgency, frequency, hematuria, flank pain and difficulty urinating.  Endocrine: Denies: hot or cold intolerance, sweats, changes in hair or nails, polyuria, polydipsia. Musculoskeletal: Denies myalgias, back pain, joint swelling, arthralgias and gait problem.  Skin: Denies pallor, rash and wound.  Neurological: Denies dizziness, seizures, syncope, weakness, light-headedness, numbness and headaches.  Hematological: Denies adenopathy. Easy bruising, personal or family bleeding history  Psychiatric/Behavioral: Denies suicidal ideation, mood changes, confusion, nervousness, sleep disturbance and agitation   Past Medical History:  Diagnosis Date  . Cancer ([place])   . Diabetes mellitus without complication [facility])     Past Surgical History:  Procedure Laterality Date  . CHOLECYSTECTOMY    . paratial pancrectomy    . PORTACATH PLACEMENT    . SPLENECTOMY      Family History  Problem Relation Age of Onset  . Stroke Father   . Hypertension Other     SOCIAL HX:   reports that he has never smoked. He has never used smokeless tobacco. He reports that he does not drink alcohol or use drugs.   Current Outpatient Medications:  .  acetaminophen (TYLENOL) 500 MG tablet, Take 500 mg by mouth every 6 (six)  hours as needed (2 tabs as needed for pain)., Disp: , Rfl:  .  apixaban (ELIQUIS) 5 MG TABS tablet, Take 1 tablet (5 mg total) by mouth 2 (two) times daily for 30 days., Disp: 60 tablet, Rfl: 0 .  chlorhexidine (PERIDEX) 0.12 % solution, 5 mLs by Mouth Rinse route 3 (three) times daily., Disp: , Rfl:  .  diphenoxylate-atropine (LOMOTIL) 2.5-0.025 MG tablet, Take 1 tablet by mouth 4 (four) times daily as needed for diarrhea or loose stools., Disp: 30 tablet, Rfl: 1 .  fluconazole (DIFLUCAN) 200 MG tablet, Take fluconazole 200 mg (1 tablet) twice daily at 12 noon and 4pm., Disp: 30 tablet, Rfl: 11 .  Insulin Glargine, 1 Unit Dial, (TOUJEO SOLOSTAR) 300 UNIT/ML SOPN, Inject 15 Units into the skin 2 (two) times daily., Disp: , Rfl:  .  metFORMIN (GLUCOPHAGE) 1000 MG tablet, Take 1,000 mg by mouth 2 (two) times daily with a meal., Disp: , Rfl:  .  metoprolol succinate (TOPROL-XL) 25 MG 24 hr tablet, Take 25 mg by mouth daily., Disp: , Rfl:  .  nitroGLYCERIN (NITROSTAT) 0.4 MG SL tablet, Place 0.4 mg under the tongue every 5 (five) minutes as needed for chest pain., Disp: , Rfl:  .  ondansetron (ZOFRAN) 4 MG tablet, Take 1 tablet (4 mg total) by mouth daily as needed for nausea or vomiting., Disp: 30 tablet, Rfl: 1 .  prochlorperazine (COMPAZINE) 10 MG tablet, Take 1 tablet (10 mg total) by mouth every 6 (six) hours as needed for nausea or vomiting., Disp: 30 tablet, Rfl: 0 .  rosuvastatin (CRESTOR) 10 MG tablet, Take 10 mg by mouth daily., Disp: , Rfl:   EXAM:   VITALS per patient if applicable: none reported  GENERAL: alert, oriented, appears well and in no acute distress  HEENT: atraumatic, conjunttiva clear, no obvious abnormalities on inspection of external nose and ears, wear corrective lenses  NECK: normal movements of the head and neck  LUNGS: on inspection no signs of respiratory distress, breathing rate appears normal, no obvious gross increased work of breathing, gasping or wheezing   CV: no obvious cyanosis  MS: moves all visible extremities without noticeable abnormality  PSYCH/NEURO: pleasant and cooperative, no obvious depression or anxiety, speech and thought processing grossly intact  ASSESSMENT AND PLAN:   Prostate cancer ([place]) -Follows with onc next week. -Currently undergoing chemotherapy.  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  Other acute pulmonary embolism without acute cor pulmonale ([facility]) -On Eliquis.  Essential hypertension -Per report, well controlled, altho he has no measurements to share with me today.  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 -stable, no chest pain  Physical deconditioning -PT/OT referrals today.   Medical decision making of high complexity was utilized today.     I discussed the assessment and treatment plan with the patient. The patient was provided an opportunity to ask questions and all were answered. The patient agreed with the plan and demonstrated an understanding of the instructions.   The patient was advised to call back or seek an in-person evaluation if the symptoms worsen or if the condition fails to improve as anticipated.    Lelon Frohlich, MD  [facility]

## 2019-04-02 NOTE — Addendum Note (Signed)
Addended by: Westley Hummer B on: 04/02/2019 04:19 PM   Modules accepted: Orders

## 2019-04-06 NOTE — Progress Notes (Addendum)
Followed up with patient today for his Long Term Follow-up visit Month 24 in the [facility] Clinical Trial.  Patient indicated that at this time he is living with his daughter in [place] due to the [place] 19 pandemic.  He is currently being receiving chemotherapy in [place] by a doctor name Dr. Leary Roca (patient was not sure of the name or spelling). Patient indicated that he is doing well.  Patient has not experienced any skeletal issues since his last follow-up visit.    Seanpatrick Maisano, BS, MA, CRC  Research Coordinator    FOR FULL DOCUMENTATION OF THIS CLINICAL TRIAL VISIT, PLEASE SEE THE SOURCE DOCUMENT, AS THIS IS THE FIRST PLACE VISIT INFORMATION FROM CLINICAL TRIAL VISITS IS RECORDED.

## 2019-04-09 ENCOUNTER — Inpatient Hospital Stay: Payer: Medicare Other

## 2019-04-09 ENCOUNTER — Encounter: Payer: Self-pay | Admitting: Oncology

## 2019-04-09 ENCOUNTER — Other Ambulatory Visit: Payer: Self-pay

## 2019-04-09 ENCOUNTER — Inpatient Hospital Stay (HOSPITAL_BASED_OUTPATIENT_CLINIC_OR_DEPARTMENT_OTHER): Payer: Medicare Other | Admitting: Oncology

## 2019-04-09 ENCOUNTER — Inpatient Hospital Stay: Payer: Medicare Other | Attending: Oncology

## 2019-04-09 VITALS — BP 119/67 | HR 95 | Temp 97.9°F | Resp 18 | Ht 68.0 in | Wt 132.7 lb

## 2019-04-09 DIAGNOSIS — Z86711 Personal history of pulmonary embolism: Secondary | ICD-10-CM | POA: Insufficient documentation

## 2019-04-09 DIAGNOSIS — Z5189 Encounter for other specified aftercare: Secondary | ICD-10-CM | POA: Insufficient documentation

## 2019-04-09 DIAGNOSIS — Z79899 Other long term (current) drug therapy: Secondary | ICD-10-CM | POA: Insufficient documentation

## 2019-04-09 DIAGNOSIS — C7951 Secondary malignant neoplasm of bone: Secondary | ICD-10-CM | POA: Insufficient documentation

## 2019-04-09 DIAGNOSIS — C61 Malignant neoplasm of prostate: Secondary | ICD-10-CM | POA: Insufficient documentation

## 2019-04-09 DIAGNOSIS — Z5111 Encounter for antineoplastic chemotherapy: Secondary | ICD-10-CM | POA: Insufficient documentation

## 2019-04-09 DIAGNOSIS — Z794 Long term (current) use of insulin: Secondary | ICD-10-CM

## 2019-04-09 DIAGNOSIS — C7800 Secondary malignant neoplasm of unspecified lung: Secondary | ICD-10-CM | POA: Diagnosis not present

## 2019-04-09 DIAGNOSIS — Z7901 Long term (current) use of anticoagulants: Secondary | ICD-10-CM | POA: Diagnosis not present

## 2019-04-09 LAB — CMP (CANCER CENTER ONLY)
ALT: 34 U/L (ref 0–44)
AST: 45 U/L — ABNORMAL HIGH (ref 15–41)
Albumin: 2.5 g/dL — ABNORMAL LOW (ref 3.5–5.0)
Alkaline Phosphatase: 80 U/L (ref 38–126)
Anion gap: 12 (ref 5–15)
BUN: 9 mg/dL (ref 8–23)
CO2: 19 mmol/L — ABNORMAL LOW (ref 22–32)
Calcium: 8.2 mg/dL — ABNORMAL LOW (ref 8.9–10.3)
Chloride: 105 mmol/L (ref 98–111)
Creatinine: 0.82 mg/dL (ref 0.61–1.24)
GFR, Est AFR Am: >60 mL/min (ref >60)
GFR, Estimated: >60 mL/min (ref >60)
Glucose, Bld: 134 mg/dL — ABNORMAL HIGH (ref 70–99)
Potassium: 4.8 mmol/L (ref 3.5–5.1)
Sodium: 136 mmol/L (ref 135–145)
Total Bilirubin: 0.3 mg/dL (ref 0.3–1.2)
Total Protein: 6 g/dL — ABNORMAL LOW (ref 6.5–8.1)

## 2019-04-09 LAB — CBC WITH DIFFERENTIAL (CANCER CENTER ONLY)
Abs Immature Granulocytes: 0.36 10*3/uL — ABNORMAL HIGH (ref 0.00–0.07)
Basophils Absolute: 0.1 10*3/uL (ref 0.0–0.1)
Basophils Relative: 1 %
Eosinophils Absolute: 0 10*3/uL (ref 0.0–0.5)
Eosinophils Relative: 0 %
HCT: 38.1 % — ABNORMAL LOW (ref 39.0–52.0)
Hemoglobin: 10.9 g/dL — ABNORMAL LOW (ref 13.0–17.0)
Immature Granulocytes: 2 %
Lymphocytes Relative: 17 %
Lymphs Abs: 2.7 10*3/uL (ref 0.7–4.0)
MCH: 26.3 pg (ref 26.0–34.0)
MCHC: 28.6 g/dL — ABNORMAL LOW (ref 30.0–36.0)
MCV: 92 fL (ref 80.0–100.0)
Monocytes Absolute: 1.4 10*3/uL — ABNORMAL HIGH (ref 0.1–1.0)
Monocytes Relative: 9 %
Neutro Abs: 11.3 10*3/uL — ABNORMAL HIGH (ref 1.7–7.7)
Neutrophils Relative %: 71 %
Platelet Count: 410 10*3/uL — ABNORMAL HIGH (ref 150–400)
RBC: 4.14 MIL/uL — ABNORMAL LOW (ref 4.22–5.81)
RDW: 21.7 % — ABNORMAL HIGH (ref 11.5–15.5)
WBC Count: 15.8 10*3/uL — ABNORMAL HIGH (ref 4.0–10.5)
nRBC: 2 % — ABNORMAL HIGH (ref 0.0–0.2)

## 2019-04-09 MED ORDER — MEGESTROL ACETATE 400 MG/10ML PO SUSP: 400.0000 mg | Freq: Two times a day (BID) | ORAL | 0 refills | Status: AC

## 2019-04-09 NOTE — Progress Notes (Signed)
Hematology and Oncology Follow Up Visit  Michael Frey [id number] May 05, 1941 78 y.o. 04/09/2019 8:13 AM System, Pcp Not InNo ref. provider found   Principle Diagnosis: 78 year old man with advanced prostate cancer with disease to the bone diagnosed in 2017.  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    Prior Therapy:  He was started on degarelix and Xgeva as well as Xtandi as a part of study while living in [place].    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  He was started on docetaxel 75 mg per metered square with growth factor support since May 2 of 2019.  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  Prednisone was held and subsequently restarted 5 mg twice a day.  He subsequently developed progression of disease with PSA up to 20.  Current therapy:   Jevtana 20 mg per metered square started April 24 of 2020.  He is status post 2 cycles of therapy.  Lupron 30 mg every 4 months started on April 9 of 2020.  Xgeva 120 mg every 6 weeks started on February 11, 2019.   Interim History: Michael Frey returns today for a follow-up.  Since the last visit, he received the last cycle of chemotherapy 3 weeks ago with few complications.  He required 2 hospitalizations since the last cycle.  He was found to have pulmonary embolism after the first hospitalization and subsequently developed neutropenic fever that required another hospitalization.  Since his discharge, he is recovering slowly at this time and still have some issues with diarrhea.  He is using Lomotil which have helped him with his symptoms.  He has not had a bowel movement at this time but was averaging about 4-5 loose bowel habits.  His appetite has also decreased and lost 5 pounds in the last 3 weeks.  He denies any hematochezia, melena or respiratory  complaints.  He denied headaches, blurry vision, syncope or seizures.  Denies any fevers, chills or sweats.  Denied chest pain, palpitation, orthopnea or leg edema.  Denied cough, wheezing or hemoptysis.  Denied nausea, vomiting or abdominal pain.  Denies any constipation or diarrhea.  Denies any frequency urgency or hesitancy.  Denies any arthralgias or myalgias.  Denies any skin rashes or lesions.  Denies any bleeding or clotting tendency.  Denies any easy bruising.  Denies any hair or nail changes.  Denies any anxiety or depression.  Remaining review of system is negative.          Medications: I have reviewed the patient's current medications.  Current Outpatient Medications  Medication Sig Dispense Refill  . acetaminophen (TYLENOL) 500 MG tablet Take 500 mg by mouth every 6 (six) hours as needed (2 tabs as needed for pain).    Marland Kitchen apixaban (ELIQUIS) 5 MG TABS tablet Take 1 tablet (5 mg total) by mouth 2 (two) times daily for 30 days. 60 tablet 0  . chlorhexidine (PERIDEX) 0.12 % solution 5 mLs by Mouth Rinse route 3 (three) times daily.    . diphenoxylate-atropine (LOMOTIL) 2.5-0.025 MG tablet Take 1 tablet by mouth 4 (four) times daily as needed for diarrhea or loose stools. 30 tablet 1  . fluconazole (DIFLUCAN) 200 MG tablet Take fluconazole 200 mg (1 tablet) twice daily at 12 noon and 4pm. 30 tablet 11  . Insulin Glargine, 1 Unit Dial, (TOUJEO SOLOSTAR) 300  UNIT/ML SOPN Inject 15 Units into the skin 2 (two) times daily.    . metFORMIN (GLUCOPHAGE) 1000 MG tablet Take 1,000 mg by mouth 2 (two) times daily with a meal.    . metoprolol succinate (TOPROL-XL) 25 MG 24 hr tablet Take 25 mg by mouth daily.    . nitroGLYCERIN (NITROSTAT) 0.4 MG SL tablet Place 0.4 mg under the tongue every 5 (five) minutes as needed for chest pain.    Marland Kitchen ondansetron (ZOFRAN) 4 MG tablet Take 1 tablet (4 mg total) by mouth daily as needed for nausea or vomiting. 30 tablet 1  . prochlorperazine (COMPAZINE) 10 MG  tablet Take 1 tablet (10 mg total) by mouth every 6 (six) hours as needed for nausea or vomiting. 30 tablet 0  . rosuvastatin (CRESTOR) 10 MG tablet Take 10 mg by mouth daily.     No current facility-administered medications for this visit.      Allergies:  Allergies  Allergen Reactions  . Naproxen     Past Medical History, Surgical history, Social history, and Family History were reviewed and updated.    Physical Exam: Blood pressure 119/67, pulse 95, temperature 97.9 F (36.6 C), temperature source Oral, resp. rate 18, height 5\' 8"  (1.727 m), weight 132 lb 11.2 oz (60.2 kg), SpO2 95 %.   ECOG: 1   General appearance: Comfortable appearing without any discomfort Head: Normocephalic without any trauma Oropharynx: Mucous membranes are moist and pink without any thrush or ulcers. Eyes: Pupils are equal and round reactive to light. Lymph nodes: No cervical, supraclavicular, inguinal or axillary lymphadenopathy.   Heart:regular rate and rhythm.  S1 and S2 without leg edema. Lung: Clear without any rhonchi or wheezes.  No dullness to percussion. Abdomin: Soft, nontender, nondistended with good bowel sounds.  No hepatosplenomegaly. Musculoskeletal: No joint deformity or effusion.  Full range of motion noted. Neurological: No deficits noted on motor, sensory and deep tendon reflex exam. Skin: No petechial rash or dryness.  Appeared moist.      Lab Results: Lab Results  Component Value Date   WBC 7.3 03/30/2019   HGB 9.9 (L) 03/30/2019   HCT 32.4 (L) 03/30/2019   MCV 87.3 03/30/2019   PLT 315 03/30/2019     Chemistry      Component Value Date/Time   NA 138 03/29/2019 0447   K 3.1 (L) 03/29/2019 0447   CL 110 03/29/2019 0447   CO2 20 (L) 03/29/2019 0447   BUN 8 03/29/2019 0447   CREATININE 0.74 03/29/2019 0447   CREATININE 0.94 03/19/2019 0912      Component Value Date/Time   CALCIUM 7.4 (L) 03/29/2019 0447   ALKPHOS 56 03/28/2019 1640   AST 16 03/28/2019 1640    AST 87 (H) 03/19/2019 0912   ALT 15 03/28/2019 1640   ALT 53 (H) 03/19/2019 0912   BILITOT 0.4 03/28/2019 1640   BILITOT 0.2 (L) 03/19/2019 0912       Results for Michael Frey (MRN [id number]) as of 04/09/2019 07:36  Ref. Range 02/26/2019 10:41 03/19/2019 09:12  Prostate Specific Ag, Serum Latest Ref Range: 0.0 - 4.0 ng/mL 19.9 (H) 18.9 (H)      Impression and Plan:  78 year old man with:  1.    Castration-resistant prostate cancer with disease to the bone documented in 2017.Marland Kitchen  He received 2 cycles of chemotherapy with modest response in his PSA although he had multiple complications outlined above.  He developed hyperglycemia, pulmonary embolism and neutropenic fever.  Risks and benefits of  continuing chemotherapy versus discontinuation was reviewed today.  Delaying chemotherapy and resuming it at reduced doses will also be a possibility.  After discussion today, we have elected to defer cycle 3 for 3 more weeks and evaluate at that time.  Will consider restarting Jevtana on a reduced dose by 25%.  2.  IV access: Port-A-Cath continues to be accessed periodically.  3.    Anorexia: Prescription for Megace was given to the patient with instructions how to do it.  Complications occluding thrombosis were also reiterated.  4.  Androgen depravation: His next Lupron injection will be August 2020.  Long-term complication associated with this therapy was reviewed which include hot flashes and weight gain.  5.  Bone directed therapy: He remains on Xgeva without any major complications.  Last [name] was given on Mar 19, 2019 and that will be repeated every 6 weeks.  6.  Cryptococcal pneumonia: He has been on Diflucan for that previously.  He will have further recommendation from infectious disease regarding this issue.  7.  Goals of care and prognosis: His disease is incurable and any treatment at this time is palliative.  Aggressive therapy may be warranted given his reasonable  performance status.  This was reiterated and emphasized today to the patient and his daughter via telecommunication method.  8.  Growth factor support: He is at high risk of neutropenic fever and will require growth factor support after each cycle of therapy.  He will receive Neulasta onpro if he resumes chemotherapy.  9.  Pulmonary embolism: He is currently on Eliquis without any major issues.  10.  Follow-up: In 3 weeks for repeat evaluation.   25  minutes was spent with the patient face-to-face today.  More than 50% of time was spent on reviewing his status, uppercase related therapy and management options.       Zola Button, MD 6/5/20208:13 AM

## 2019-04-10 LAB — PROSTATE-SPECIFIC AG, SERUM (LABCORP): Prostate Specific Ag, Serum: 19.1 ng/mL — ABNORMAL HIGH (ref 0.0–4.0)

## 2019-04-13 ENCOUNTER — Encounter: Payer: Self-pay | Admitting: Physical Therapy

## 2019-04-13 ENCOUNTER — Ambulatory Visit: Payer: Medicare Other | Attending: Internal Medicine | Admitting: Physical Therapy

## 2019-04-13 ENCOUNTER — Other Ambulatory Visit: Payer: Self-pay

## 2019-04-13 DIAGNOSIS — R262 Difficulty in walking, not elsewhere classified: Secondary | ICD-10-CM | POA: Diagnosis present

## 2019-04-13 DIAGNOSIS — R293 Abnormal posture: Secondary | ICD-10-CM | POA: Insufficient documentation

## 2019-04-13 DIAGNOSIS — M6281 Muscle weakness (generalized): Secondary | ICD-10-CM | POA: Diagnosis not present

## 2019-04-13 NOTE — Therapy (Signed)
[facility] [address] [place], [place], [address] Phone: [phone]   Fax:  [phone]  Physical Therapy Evaluation  Patient Details  Name: Michael Frey MRN: [id number] Date of Birth: Jun 22, 1941 Referring Provider (PT): Isaac Bliss, Rayford Halsted, MD   Encounter Date: 04/13/2019  PT End of Session - 04/13/19 1426    Visit Number  1    Date for PT Re-Evaluation  06/08/19    PT Start Time  1426    PT Stop Time  1515    PT Time Calculation (min)  49 min    Activity Tolerance  Patient tolerated treatment well       Past Medical History:  Diagnosis Date  . Cancer ([place])   . Diabetes mellitus without complication [facility])     Past Surgical History:  Procedure Laterality Date  . CHOLECYSTECTOMY    . paratial pancrectomy    . PORTACATH PLACEMENT    . SPLENECTOMY      There were no vitals filed for this visit.   Subjective Assessment - 04/13/19 1427    Subjective  I Frey't have the will to want to walk anymore because of taking chemo.  I didn't get it last week because I was so weak.  I walk around the house and go up the steps 1x/day.      Pertinent History  under chemo treatments currently    Limitations  Walking    How long can you walk comfortably?  100 yards, but haven't done that for a while    Patient Stated Goals  be able to feel stronger, get up and down the stairs more easily    Currently in Pain?  No/denies         [facility] PT Assessment - 04/13/19 0001      Assessment   Medical Diagnosis  R53.81 (ICD-10-CM) - Physical deconditioning    Referring Provider (PT)  Isaac Bliss, Rayford Halsted, MD    Onset Date/Surgical Date  --   since chemo; last week worsening   Prior Therapy  No      Precautions   Precautions  None      Restrictions   Weight Bearing Restrictions  No      Balance Screen   Has the patient fallen in the past 6 months  Yes    How many times?  2    Has the patient had a  decrease in activity level because of a fear of falling?   No    Is the patient reluctant to leave their home because of a fear of falling?   No      Home Film/video editor residence    Living Arrangements  Children;Other relatives   daughter and her family     Prior Function   Level of Independence  Independent    Vocation  Retired      Associate Professor   Overall Cognitive Status  Within Functional Limits for tasks assessed      Posture/Postural Control   Posture/Postural Control  Postural limitations    Postural Limitations  Rounded Shoulders;Increased thoracic kyphosis      ROM / Strength   AROM / PROM / Strength  Strength;PROM      PROM   Overall PROM Comments  hip ER 50% limited      Strength   Strength Assessment Site  Hip;Knee;Ankle    Right/Left Hip  Right;Left    Right Hip Flexion  4+/5    Right Hip External Rotation   4-/5    Right Hip Internal Rotation  4+/5    Right Hip ABduction  4+/5    Left Hip Flexion  4-/5    Left Hip External Rotation  4/5    Left Hip Internal Rotation  4/5    Left Hip ABduction  4/5    Right/Left Knee  Right;Left    Right Knee Flexion  5/5    Right Knee Extension  5/5    Left Knee Flexion  4-/5    Left Knee Extension  4-/5    Right/Left Ankle  Right;Left    Right Ankle Dorsiflexion  3+/5    Left Ankle Dorsiflexion  3+/5      Flexibility   Soft Tissue Assessment /Muscle Length  yes    Hamstrings  50% limited bilateral      Ambulation/Gait   Ambulation/Gait  Yes    Ambulation/Gait Assistance  6: Modified independent (Device/Increase time)    Ambulation Distance (Feet)  540 Feet    Assistive device  Straight cane    Gait Pattern  Decreased stance time - left;Decreased dorsiflexion - right;Decreased dorsiflexion - left      6 minute walk test results    Aerobic Endurance Distance Walked  540    Endurance additional comments  used straight cane       Standardized Balance Assessment   Standardized Balance  Assessment  Five Times Sit to Stand;Timed Up and Go Test    Five times sit to stand comments   28      Timed Up and Go Test   Normal TUG (seconds)  19                Objective measurements completed on examination: See above findings.      [facility] Adult PT Treatment/Exercise - 04/13/19 0001      Exercises   Exercises  Knee/Hip;Lumbar      Lumbar Exercises: Supine   Clam  20 reps;Limitations    Clam Limitations  red band    Bent Knee Raise  20 reps      Knee/Hip Exercises: Supine   Constance Haw  Strengthening;10 reps             PT Education - 04/13/19 1552    Education Details   Access Code: [email or web address]     Person(s) Educated  Patient    Methods  Explanation;Demonstration;Tactile cues;Verbal cues;Handout    Comprehension  Verbalized understanding;Returned demonstration       PT Short Term Goals - 04/13/19 1537      PT SHORT TERM GOAL #1   Title  ind with initial HEP    Time  3    Period  Weeks    Status  New    Target Date  05/04/19      PT SHORT TERM GOAL #2   Title  5 x sit to stand < or = to 23 seconds    Time  4    Period  Weeks    Status  New    Target Date  05/11/19        PT Long Term Goals - 04/13/19 1538      PT LONG TERM GOAL #1   Title  Pt will be able to walk 800 feet in 6 min walk test    Baseline  540 ft    Time  8    Period  Weeks    Status  New  Target Date  06/08/19      PT LONG TERM GOAL #2   Title  Pt will perform TUG in < or = to 13 sec for reduced risk of falls    Time  8    Period  Weeks    Status  New    Target Date  06/08/19      PT LONG TERM GOAL #3   Title  Pt will report he is able to get up the stairs at home with 50% less difficulty    Time  8    Period  Weeks    Status  New    Target Date  06/08/19      PT LONG TERM GOAL #4   Title  Pt will be ind with advanced HEP    Time  8    Period  Weeks    Status  New    Target Date  06/08/19             Plan - 04/13/19 1527    Clinical Impression  Statement  Pt presents to clinic due to deconditioning that has occured while taking chemo for cancer treatments.  Pt ambulates slowly with cane and shorter step length on Rt side.  He performed 6 min walk test 540 ft and TUG in 19 seconds.  Pt performs 5 x sit to stand in 28 sec.  Pt demonstrates LE weakness bilaterally.  He has decreased hamstring length and hip ER limitations.  He has some postural deficits with rounded shoulders.  He will benefit from skilled PT to address strenth and endurance along with other deficits for improved outcomes and reduced risk of falls.    Personal Factors and Comorbidities  Age;Comorbidity 2    Comorbidities  history of falls, chemo    Examination-Activity Limitations  Bed Mobility;Locomotion Level    Examination-Participation Restrictions  Community Activity;Meal Prep    Clinical Decision Making  Low    Rehab Potential  Good    PT Frequency  2x / week    PT Duration  8 weeks    PT Treatment/Interventions  ADLs/Self Care Home Management;Cryotherapy;Dentist;Therapeutic activities;Therapeutic exercise;Neuromuscular re-education;Patient/family education;Manual techniques;Passive range of motion;Taping    PT Next Visit Plan  nustep, walking, hamstring and hip ROM stretches, core strength, LE strengthening    PT Home Exercise Plan  [email or web address]     Consulted and Agree with Plan of Care  Patient       Patient will benefit from skilled therapeutic intervention in order to improve the following deficits and impairments:  Abnormal gait, Decreased endurance, Difficulty walking, Increased muscle spasms  Visit Diagnosis: Muscle weakness (generalized)  Difficulty in walking, not elsewhere classified  Abnormal posture     Problem List Patient Active Problem List   Diagnosis Date Noted  . Hospital acquired PNA 03/28/2019  . Anemia associated with chemotherapy 03/28/2019  . Melena 03/28/2019  . Neutropenia ([place])  03/28/2019  . Epistaxis 03/28/2019  . PNA (pneumonia) 03/28/2019  . Pressure injury of skin 03/23/2019  . Pulmonary embolus ([place]) 03/22/2019  . DM (diabetes mellitus), type 2 with complications ([place]) 92/42/6834  . Dehydration 03/22/2019  . CAD (coronary artery disease) 03/22/2019  . Leukocytosis 03/22/2019  . Port-A-Cath in place 03/19/2019  . 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 Prostate cancer ([place]) 02/04/2019    Jule Ser, PT 04/13/2019, 3:52 PM  [facility] [address], [place], [place], [address] Phone: [phone]  Fax:  [phone]  Name: Michael Frey MRN: [id number] Date of Birth: [birth year]

## 2019-04-13 NOTE — Patient Instructions (Signed)
Access Code: [email or web address]  URL: [email or web address]  Date: 04/13/2019  Prepared by: Jari Favre   Exercises  Supine March - 10 reps - 2 sets - 1x daily - 7x weekly  Hooklying clam with kegel - 10 reps - 2 sets - 1x daily - 7x weekly  Supine Bridge - 10 reps - 2 sets - 1x daily - 7x weekly

## 2019-04-15 ENCOUNTER — Other Ambulatory Visit: Payer: Self-pay | Admitting: Pharmacist

## 2019-04-21 ENCOUNTER — Ambulatory Visit: Payer: Medicare Other | Admitting: Physical Therapy

## 2019-04-23 ENCOUNTER — Encounter: Payer: Self-pay | Admitting: Physical Therapy

## 2019-04-23 ENCOUNTER — Ambulatory Visit: Payer: Medicare Other | Admitting: Physical Therapy

## 2019-04-23 ENCOUNTER — Other Ambulatory Visit: Payer: Self-pay

## 2019-04-23 DIAGNOSIS — M6281 Muscle weakness (generalized): Secondary | ICD-10-CM

## 2019-04-23 DIAGNOSIS — R293 Abnormal posture: Secondary | ICD-10-CM

## 2019-04-23 DIAGNOSIS — R262 Difficulty in walking, not elsewhere classified: Secondary | ICD-10-CM

## 2019-04-23 NOTE — Therapy (Signed)
[facility] [address] [place], [place], [address] Phone: [phone]   Fax:  [phone]  Physical Therapy Treatment  Patient Details  Name: Michael Frey MRN: [id number] Date of Birth: 05-10-1941 Referring Provider (PT): Isaac Bliss, Rayford Halsted, MD   Encounter Date: 04/23/2019  PT End of Session - 04/23/19 1432    Visit Number  2    Date for PT Re-Evaluation  06/08/19    PT Start Time  1856    PT Stop Time  1509    PT Time Calculation (min)  41 min    Activity Tolerance  Patient tolerated treatment well       Past Medical History:  Diagnosis Date  . Cancer ([place])   . Diabetes mellitus without complication [facility])     Past Surgical History:  Procedure Laterality Date  . CHOLECYSTECTOMY    . paratial pancrectomy    . PORTACATH PLACEMENT    . SPLENECTOMY      There were no vitals filed for this visit.  Subjective Assessment - 04/23/19 1431    Subjective  I was sick last Wednesday and had to cancel. I am feeling so weak.    Currently in Pain?  No/denies                       [facility] Adult PT Treatment/Exercise - 04/23/19 0001      Lumbar Exercises: Stretches   Active Hamstring Stretch  Right;Left;3 reps;20 seconds   sitting   Gastroc Stretch  Right;Left;3 reps;20 seconds   standing     Lumbar Exercises: Aerobic   Nustep  L1 x 10 min   seat 8; UE 8     Lumbar Exercises: Seated   Long Arc Quad on Chair  Strengthening;Right;Left;2 sets;10 reps;Weights    LAQ on Chair Weights (lbs)  2    Sit to Stand  10 reps   with UE flexion   Sit to Stand Limitations  sitting on mat table and black foam    Other Seated Lumbar Exercises  clam - red band - 2x10    Other Seated Lumbar Exercises  hip flexion sitting on mat table and black foam      Lumbar Exercises: Supine   Clam  20 reps;Limitations    Clam Limitations  red band    Bent Knee Raise  20 reps    Bridge  10 reps      Shoulder  Exercises: Seated   Flexion  AROM;Both;20 reps   cues to lift chest   Other Seated Exercises  scap squeezes - 20x               PT Short Term Goals - 04/13/19 1537      PT SHORT TERM GOAL #1   Title  ind with initial HEP    Time  3    Period  Weeks    Status  New    Target Date  05/04/19      PT SHORT TERM GOAL #2   Title  5 x sit to stand < or = to 23 seconds    Time  4    Period  Weeks    Status  New    Target Date  05/11/19        PT Long Term Goals - 04/13/19 1538      PT LONG TERM GOAL #1   Title  Pt will be able to walk 800 feet  in 6 min walk test    Baseline  540 ft    Time  8    Period  Weeks    Status  New    Target Date  06/08/19      PT LONG TERM GOAL #2   Title  Pt will perform TUG in < or = to 13 sec for reduced risk of falls    Time  8    Period  Weeks    Status  New    Target Date  06/08/19      PT LONG TERM GOAL #3   Title  Pt will report he is able to get up the stairs at home with 50% less difficulty    Time  8    Period  Weeks    Status  New    Target Date  06/08/19      PT LONG TERM GOAL #4   Title  Pt will be ind with advanced HEP    Time  8    Period  Weeks    Status  New    Target Date  06/08/19            Plan - 04/23/19 1459    Clinical Impression Statement  Pt tolerated exercises today.  He had difficulty when attempting bridge and felt like he was giong to have to go to the bathroom.  Pt showed some improved upright posture with cues to lift his chest during UE overhead flexion.  Pt fatigues at the end of each exercises and needed to breathe more heavily during the exercises.  He will continue to benefit from skilled PT to improve strength and endurance    PT Treatment/Interventions  ADLs/Self Care Home Management;Cryotherapy;Dentist;Therapeutic activities;Therapeutic exercise;Neuromuscular re-education;Patient/family education;Manual techniques;Passive range of  motion;Taping    PT Next Visit Plan  nustep, walking, hamstring and hip ROM stretches, core strength, LE strengthening    PT Home Exercise Plan  [email or web address]     Consulted and Agree with Plan of Care  Patient       Patient will benefit from skilled therapeutic intervention in order to improve the following deficits and impairments:  Abnormal gait, Decreased endurance, Difficulty walking, Increased muscle spasms  Visit Diagnosis: 1. Muscle weakness (generalized)   2. Difficulty in walking, not elsewhere classified   3. Abnormal posture        Problem List Patient Active Problem List   Diagnosis Date Noted  . Hospital acquired PNA 03/28/2019  . Anemia associated with chemotherapy 03/28/2019  . Melena 03/28/2019  . Neutropenia ([place]) 03/28/2019  . Epistaxis 03/28/2019  . PNA (pneumonia) 03/28/2019  . Pressure injury of skin 03/23/2019  . Pulmonary embolus ([place]) 03/22/2019  . DM (diabetes mellitus), type 2 with complications ([place]) 67/10/4579  . Dehydration 03/22/2019  . CAD (coronary artery disease) 03/22/2019  . Leukocytosis 03/22/2019  . Port-A-Cath in place 03/19/2019  . 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 Prostate cancer ([place]) 02/04/2019    Jule Ser, PT 04/23/2019, 3:14 PM  [facility] [address], [place], [place], [address] Phone: [phone]   Fax:  [phone]  Name: Ramsay Bognar MRN: [id number] Date of Birth: [birth year]

## 2019-04-26 ENCOUNTER — Telehealth: Payer: Self-pay | Admitting: *Deleted

## 2019-04-26 NOTE — Telephone Encounter (Signed)
In basket message came across that patient had not read My Chart message response from RN on 04/12/19.  Pt states he did not see the response- notified that he is no longer to take the prednisone. Verbalized understanding.

## 2019-04-27 ENCOUNTER — Encounter: Payer: Medicare Other | Admitting: Physical Therapy

## 2019-04-28 ENCOUNTER — Inpatient Hospital Stay (HOSPITAL_BASED_OUTPATIENT_CLINIC_OR_DEPARTMENT_OTHER): Payer: Medicare Other | Admitting: Oncology

## 2019-04-28 ENCOUNTER — Inpatient Hospital Stay: Payer: Medicare Other

## 2019-04-28 ENCOUNTER — Other Ambulatory Visit: Payer: Self-pay

## 2019-04-28 ENCOUNTER — Other Ambulatory Visit: Payer: Medicare Other

## 2019-04-28 VITALS — BP 140/66 | HR 85 | Temp 97.8°F | Resp 18 | Wt 131.2 lb

## 2019-04-28 DIAGNOSIS — C61 Malignant neoplasm of prostate: Secondary | ICD-10-CM

## 2019-04-28 DIAGNOSIS — Z86711 Personal history of pulmonary embolism: Secondary | ICD-10-CM

## 2019-04-28 DIAGNOSIS — Z79899 Other long term (current) drug therapy: Secondary | ICD-10-CM

## 2019-04-28 DIAGNOSIS — C7800 Secondary malignant neoplasm of unspecified lung: Secondary | ICD-10-CM | POA: Diagnosis not present

## 2019-04-28 DIAGNOSIS — C7951 Secondary malignant neoplasm of bone: Secondary | ICD-10-CM

## 2019-04-28 DIAGNOSIS — Z95828 Presence of other vascular implants and grafts: Secondary | ICD-10-CM

## 2019-04-28 DIAGNOSIS — Z5111 Encounter for antineoplastic chemotherapy: Secondary | ICD-10-CM | POA: Diagnosis not present

## 2019-04-28 DIAGNOSIS — Z794 Long term (current) use of insulin: Secondary | ICD-10-CM

## 2019-04-28 DIAGNOSIS — Z7901 Long term (current) use of anticoagulants: Secondary | ICD-10-CM

## 2019-04-28 LAB — CMP (CANCER CENTER ONLY)
ALT: 58 U/L — ABNORMAL HIGH (ref 0–44)
AST: 35 U/L (ref 15–41)
Albumin: 2.5 g/dL — ABNORMAL LOW (ref 3.5–5.0)
Alkaline Phosphatase: 68 U/L (ref 38–126)
Anion gap: 9 (ref 5–15)
BUN: 13 mg/dL (ref 8–23)
CO2: 21 mmol/L — ABNORMAL LOW (ref 22–32)
Calcium: 8.2 mg/dL — ABNORMAL LOW (ref 8.9–10.3)
Chloride: 109 mmol/L (ref 98–111)
Creatinine: 0.83 mg/dL (ref 0.61–1.24)
GFR, Est AFR Am: >60 mL/min (ref >60)
GFR, Estimated: >60 mL/min (ref >60)
Glucose, Bld: 129 mg/dL — ABNORMAL HIGH (ref 70–99)
Potassium: 4.7 mmol/L (ref 3.5–5.1)
Sodium: 139 mmol/L (ref 135–145)
Total Bilirubin: 0.3 mg/dL (ref 0.3–1.2)
Total Protein: 6 g/dL — ABNORMAL LOW (ref 6.5–8.1)

## 2019-04-28 LAB — CBC WITH DIFFERENTIAL (CANCER CENTER ONLY)
Abs Immature Granulocytes: 0.04 10*3/uL (ref 0.00–0.07)
Basophils Absolute: 0.1 10*3/uL (ref 0.0–0.1)
Basophils Relative: 0 %
Eosinophils Absolute: 0.2 10*3/uL (ref 0.0–0.5)
Eosinophils Relative: 1 %
HCT: 33.2 % — ABNORMAL LOW (ref 39.0–52.0)
Hemoglobin: 10.2 g/dL — ABNORMAL LOW (ref 13.0–17.0)
Immature Granulocytes: 0 %
Lymphocytes Relative: 18 %
Lymphs Abs: 2.1 10*3/uL (ref 0.7–4.0)
MCH: 26.3 pg (ref 26.0–34.0)
MCHC: 30.7 g/dL (ref 30.0–36.0)
MCV: 85.6 fL (ref 80.0–100.0)
Monocytes Absolute: 1 10*3/uL (ref 0.1–1.0)
Monocytes Relative: 8 %
Neutro Abs: 8.3 10*3/uL — ABNORMAL HIGH (ref 1.7–7.7)
Neutrophils Relative %: 73 %
Platelet Count: 478 10*3/uL — ABNORMAL HIGH (ref 150–400)
RBC: 3.88 MIL/uL — ABNORMAL LOW (ref 4.22–5.81)
RDW: 21.3 % — ABNORMAL HIGH (ref 11.5–15.5)
WBC Count: 11.6 10*3/uL — ABNORMAL HIGH (ref 4.0–10.5)
nRBC: 0 % (ref 0.0–0.2)

## 2019-04-28 MED ORDER — SODIUM CHLORIDE 0.9 % IV SOLN
Freq: Once | INTRAVENOUS | Status: AC
  Administered 2019-04-28: 14:00:00 via INTRAVENOUS
  Filled 2019-04-28: qty 250

## 2019-04-28 MED ORDER — FAMOTIDINE IN NACL 20-0.9 MG/50ML-% IV SOLN
INTRAVENOUS | Status: AC
  Filled 2019-04-28: qty 50

## 2019-04-28 MED ORDER — DENOSUMAB 120 MG/1.7ML ~~LOC~~ SOLN
SUBCUTANEOUS | Status: AC
  Filled 2019-04-28: qty 1.7

## 2019-04-28 MED ORDER — SODIUM CHLORIDE 0.9% FLUSH
10.0000 mL | Freq: Once | INTRAVENOUS | Status: AC
  Administered 2019-04-28: 14:00:00 10 mL
  Filled 2019-04-28: qty 10

## 2019-04-28 MED ORDER — DEXAMETHASONE SODIUM PHOSPHATE 10 MG/ML IJ SOLN
10.0000 mg | Freq: Once | INTRAMUSCULAR | Status: AC
  Administered 2019-04-28: 10 mg via INTRAVENOUS

## 2019-04-28 MED ORDER — DENOSUMAB 120 MG/1.7ML ~~LOC~~ SOLN
120.0000 mg | Freq: Once | SUBCUTANEOUS | Status: AC
  Administered 2019-04-28: 17:00:00 120 mg via SUBCUTANEOUS

## 2019-04-28 MED ORDER — DEXAMETHASONE SODIUM PHOSPHATE 10 MG/ML IJ SOLN
INTRAMUSCULAR | Status: AC
  Filled 2019-04-28: qty 1

## 2019-04-28 MED ORDER — PEGFILGRASTIM 6 MG/0.6ML ~~LOC~~ PSKT
6.0000 mg | PREFILLED_SYRINGE | Freq: Once | SUBCUTANEOUS | Status: AC
  Administered 2019-04-28: 6 mg via SUBCUTANEOUS

## 2019-04-28 MED ORDER — HEPARIN SOD (PORK) LOCK FLUSH 100 UNIT/ML IV SOLN
500.0000 [IU] | Freq: Once | INTRAVENOUS | Status: AC | PRN
  Administered 2019-04-28: 17:00:00 500 [IU]
  Filled 2019-04-28: qty 5

## 2019-04-28 MED ORDER — DIPHENHYDRAMINE HCL 50 MG/ML IJ SOLN
INTRAMUSCULAR | Status: AC
  Filled 2019-04-28: qty 1

## 2019-04-28 MED ORDER — DIPHENHYDRAMINE HCL 50 MG/ML IJ SOLN
25.0000 mg | Freq: Once | INTRAMUSCULAR | Status: AC
  Administered 2019-04-28: 25 mg via INTRAVENOUS

## 2019-04-28 MED ORDER — SODIUM CHLORIDE 0.9 % IV SOLN
15.0000 mg/m2 | Freq: Once | INTRAVENOUS | Status: AC
  Administered 2019-04-28: 27 mg via INTRAVENOUS
  Filled 2019-04-28: qty 2.7

## 2019-04-28 MED ORDER — FAMOTIDINE IN NACL 20-0.9 MG/50ML-% IV SOLN
20.0000 mg | Freq: Once | INTRAVENOUS | Status: AC
  Administered 2019-04-28: 20 mg via INTRAVENOUS

## 2019-04-28 MED ORDER — PEGFILGRASTIM 6 MG/0.6ML ~~LOC~~ PSKT
PREFILLED_SYRINGE | SUBCUTANEOUS | Status: AC
  Filled 2019-04-28: qty 0.6

## 2019-04-28 MED ORDER — SODIUM CHLORIDE 0.9% FLUSH
10.0000 mL | INTRAVENOUS | Status: DC | PRN
  Administered 2019-04-28: 10 mL
  Filled 2019-04-28: qty 10

## 2019-04-28 NOTE — Patient Instructions (Signed)
Cabazitaxel injection What is this medicine? CABAZITAXEL (ka baz i TAX el) is a chemotherapy drug. It is used to treat prostate cancer. It targets fast dividing cells, like cancer cells, and causes these cells to die. This medicine may be used for other purposes; ask your health care provider or pharmacist if you have questions. COMMON BRAND NAME(S): Jevtana What should I tell my health care provider before I take this medicine? They need to know if you have any of these conditions: -history of stomach bleeding -kidney disease -liver disease -low blood counts, like low white cell, platelet, or red cell counts -lung or breathing disease, like asthma -recent or ongoing radiation therapy -take medicines that treat or prevent blood clots -an unusual or allergic reaction to cabazitaxel, polysorbate 80, other medicines, foods, dyes, or preservatives -pregnant or trying to get pregnant -breast-feeding How should I use this medicine? This medicine is for infusion into a vein. It is given by a health care professional in a hospital or clinic setting. Talk to your pediatrician regarding the use of this medicine in children. Special care may be needed. Overdosage: If you think you have taken too much of this medicine contact a poison control center or emergency room at once. NOTE: This medicine is only for you. Do not share this medicine with others. What if I miss a dose? It is important not to miss your dose. Call your doctor or health care professional if you are unable to keep an appointment. What may interact with this medicine? -antiviral medicines for HIV or AIDS -clarithromycin -medicines for fungal infections like ketoconazole, fluconazole, itraconazole, and voriconazole -nefazodone -telithromycin This list may not describe all possible interactions. Give your health care provider a list of all the medicines, herbs, non-prescription drugs, or dietary supplements you use. Also tell them if  you smoke, drink alcohol, or use illegal drugs. Some items may interact with your medicine. What should I watch for while using this medicine? Your condition will be monitored carefully while you are receiving this medicine. This drug may make you feel generally unwell. This is not uncommon, as chemotherapy can affect healthy cells as well as cancer cells. Report any side effects. Continue your course of treatment even though you feel ill unless your doctor tells you to stop. Call your doctor or health care professional for advice if you get a fever, chills or sore throat, or other symptoms of a cold or flu. Do not treat yourself. This drug decreases your body's ability to fight infections. Try to avoid being around people who are sick. This medicine may increase your risk to bruise or bleed. Call your doctor or health care professional if you notice any unusual bleeding. Be careful brushing and flossing your teeth or using a toothpick because you may get an infection or bleed more easily. If you have any dental work done, tell your dentist you are receiving this medicine. Avoid taking products that contain aspirin, acetaminophen, ibuprofen, naproxen, or ketoprofen unless instructed by your doctor. These medicines may hide a fever. Do not become pregnant while taking this medicine. Women should inform their doctor if they wish to become pregnant or think they might be pregnant. Men should not father a child while taking this medicine and for 3 months after stopping it. There is a potential for serious side effects to an unborn child. Talk to your health care professional or pharmacist for more information. Do not breast-feed an infant while taking this medicine. What side effects may I   notice from receiving this medicine? Side effects that you should report to your doctor or health care professional as soon as possible: -allergic reactions like skin rash, itching or hives, swelling of the face, lips, or  tongue -blood in the urine -breathing problems -constipation -dark urine -diarrhea -pain in the lower back or side -pain, tingling, numbness in the hands or feet -pain when urinating -severe abdominal pain -signs of infection - fever or chills, cough, sore throat, pain or difficulty passing urine -signs and symptoms of kidney injury like trouble passing urine or change in the amount of urine -signs of decreased platelets or bleeding - bruising, pinpoint red spots on the skin, black, tarry stools, blood in the urine -signs of decreased red blood cells - unusually weak or tired, fainting spells, lightheadedness -vomiting Side effects that usually do not require medical attention (report to your doctor or health care professional if they continue or are bothersome): -back pain -change in taste -hair loss -headache -loss of appetite -muscle or joint pain -nausea -upset stomach This list may not describe all possible side effects. Call your doctor for medical advice about side effects. You may report side effects to FDA at 1-800-FDA-1088. Where should I keep my medicine? This drug is given in a hospital or clinic and will not be stored at home. NOTE: This sheet is a summary. It may not cover all possible information. If you have questions about this medicine, talk to your doctor, pharmacist, or health care provider.  2019 Elsevier/Gold Standard (2016-11-15 15:38:47)  

## 2019-04-28 NOTE — Progress Notes (Signed)
Hematology and Oncology Follow Up Visit  Michael Frey [id number] [birth year] 78 y.o. 04/28/2019 1:40 PM System, Pcp Not InNo ref. provider found   Principle Diagnosis: 78 year old man with castration-resistant advanced prostate cancer with disease to the bone after presenting with Gleason score 5+5 = 10 in 2017.   Prior Therapy:  He was started on degarelix and Xgeva as well as Xtandi as a part of study while living in [place].    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  He was started on docetaxel 75 mg per metered square with growth factor support since May 2 of 2019.  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  Prednisone was held and subsequently restarted 5 mg twice a day.  He subsequently developed progression of disease with PSA up to 20.  Current therapy:   Jevtana 20 mg per metered square started April 24 of 2020.  He is status post 2 cycles of therapy.  Lupron 30 mg every 4 months started on April 9 of 2020.  Xgeva 120 mg every 6 weeks started on February 11, 2019.   Interim History: Michael Frey is here for a repeat evaluation.  Since the last visit, he reports no major changes in his health.  He continues to recover from his previous hospitalization and illnesses.  He is eating better and ambulating without any recent falls or syncope.  He denies any worsening neuropathy or excessive fatigue.  He denies any abdominal pain or diarrhea.  He denies any worsening pain or pulmonary complaints.  He does report some mild shortness of breath which is chronic.  He denied any alteration mental status, neuropathy, confusion or dizziness.  Denies any headaches or lethargy.  Denies any night sweats, weight loss or changes in appetite.  Denied orthopnea, dyspnea on exertion or chest discomfort.  Denies shortness of breath, difficulty breathing hemoptysis or cough.   Denies any abdominal distention, nausea, early satiety or dyspepsia.  Denies any hematuria, frequency, dysuria or nocturia.  Denies any skin irritation, dryness or rash.  Denies any ecchymosis or petechiae.  Denies any lymphadenopathy or clotting.  Denies any heat or cold intolerance.  Denies any anxiety or depression.  Remaining review of system is negative.               Medications: I have reviewed the patient's current medications.  Current Outpatient Medications  Medication Sig Dispense Refill  . acetaminophen (TYLENOL) 500 MG tablet Take 500 mg by mouth every 6 (six) hours as needed (2 tabs as needed for pain).    Marland Kitchen apixaban (ELIQUIS) 5 MG TABS tablet Take 1 tablet (5 mg total) by mouth 2 (two) times daily for 30 days. 60 tablet 0  . chlorhexidine (PERIDEX) 0.12 % solution 5 mLs by Mouth Rinse route 3 (three) times daily.    . diphenoxylate-atropine (LOMOTIL) 2.5-0.025 MG tablet Take 1 tablet by mouth 4 (four) times daily as needed for diarrhea or loose stools. 30 tablet 1  . fluconazole (DIFLUCAN) 200 MG tablet Take fluconazole 200 mg (1 tablet) twice daily at 12 noon and 4pm. 30 tablet 11  . Insulin Glargine, 1 Unit Dial, (TOUJEO SOLOSTAR) 300 UNIT/ML SOPN Inject 15 Units into the skin 2 (two) times daily.    . megestrol (MEGACE) 400 MG/10ML suspension Take 10 mLs (400 mg total) by mouth 2 (two) times daily. 240 mL 0  . metFORMIN (GLUCOPHAGE) 1000  MG tablet Take 1,000 mg by mouth 2 (two) times daily with a meal.    . metoprolol succinate (TOPROL-XL) 25 MG 24 hr tablet Take 25 mg by mouth daily.    . nitroGLYCERIN (NITROSTAT) 0.4 MG SL tablet Place 0.4 mg under the tongue every 5 (five) minutes as needed for chest pain.    Marland Kitchen ondansetron (ZOFRAN) 4 MG tablet Take 1 tablet (4 mg total) by mouth daily as needed for nausea or vomiting. 30 tablet 1  . prochlorperazine (COMPAZINE) 10 MG tablet Take 1 tablet (10 mg total) by mouth every 6 (six) hours as needed for nausea or vomiting. 30  tablet 0  . rosuvastatin (CRESTOR) 10 MG tablet Take 10 mg by mouth daily.     No current facility-administered medications for this visit.      Allergies:  Allergies  Allergen Reactions  . Naproxen     Past Medical History, Surgical history, Social history, and Family History were reviewed and updated.    Physical Exam: Blood pressure 140/66, pulse 85, temperature 97.8 F (36.6 C), temperature source Oral, resp. rate 18, weight 131 lb 3 oz (59.5 kg), SpO2 97 %.    ECOG: 1      General appearance: Alert, awake without any distress. Head: Atraumatic without abnormalities Oropharynx: Without any thrush or ulcers. Eyes: No scleral icterus. Lymph nodes: No lymphadenopathy noted in the cervical, supraclavicular, or axillary nodes Heart:regular rate and rhythm, without any murmurs or gallops.   Lung: Clear to auscultation without any rhonchi, wheezes or dullness to percussion. Abdomin: Soft, nontender without any shifting dullness or ascites. Musculoskeletal: No clubbing or cyanosis. Neurological: No motor or sensory deficits. Skin: No rashes or lesions.        Lab Results: Lab Results  Component Value Date   WBC 15.8 (H) 04/09/2019   HGB 10.9 (L) 04/09/2019   HCT 38.1 (L) 04/09/2019   MCV 92.0 04/09/2019   PLT 410 (H) 04/09/2019     Chemistry      Component Value Date/Time   NA 136 04/09/2019 0804   K 4.8 04/09/2019 0804   CL 105 04/09/2019 0804   CO2 19 (L) 04/09/2019 0804   BUN 9 04/09/2019 0804   CREATININE 0.82 04/09/2019 0804      Component Value Date/Time   CALCIUM 8.2 (L) 04/09/2019 0804   ALKPHOS 80 04/09/2019 0804   AST 45 (H) 04/09/2019 0804   ALT 34 04/09/2019 0804   BILITOT 0.3 04/09/2019 0804      Results for Michael Frey (MRN [id number]) as of 04/28/2019 13:53  Ref. Range 02/26/2019 10:41 03/19/2019 09:12 04/09/2019 08:04  Prostate Specific Ag, Serum Latest Ref Range: 0.0 - 4.0 ng/mL 19.9 (H) 18.9 (H) 19.1 (H)         Impression and Plan:  78 year old man with:  1.    Advanced prostate cancer with disease to the bone currently castration-resistant since 2017.  He is currently on Jevtana chemotherapy but therapy has been on hold for 6 weeks at this time.  Risks and benefits of restarting chemotherapy was discussed today.  Complications occluding nausea, fatigue and diarrhea was reviewed.  Plan is to receive Jevtana at 25% dose reduction and 15 mg per metered square.  2.  IV access: Port-A-Cath remains in place without any issues.  3.    Anorexia: His appetite is better although his weight has not changed dramatically.  Continue to encourage him to take nutritional supplements.  4.  Androgen depravation: He is currently on  Lupron which will be resumed every 4 months.  Next injection is in August.  5.  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  He is agreeable to continue at this time.  6.  Goals of care and prognosis: Therapy remains palliative and the goal is to keep his disease under control.  His disease is incurable.  8.  Growth factor support: He will continue to receive growth factor support after each cycle of therapy given his high risk of neutropenia.  9.  Pulmonary embolism: No bleeding noted at this time on Eliquis.  I recommended continuing this indefinitely.  10.  Follow-up: We will be in 3 weeks for repeat evaluation.   25  minutes was spent with the patient face-to-face today.  More than 50% of time was dedicated to discussing his disease status, reviewing laboratory data answering questions regarding future plan of care.       Zola Button, MD 6/24/20201:40 PM

## 2019-04-29 ENCOUNTER — Telehealth: Payer: Self-pay

## 2019-04-29 ENCOUNTER — Encounter: Payer: Self-pay | Admitting: Physical Therapy

## 2019-04-29 ENCOUNTER — Ambulatory Visit: Payer: Medicare Other | Admitting: Physical Therapy

## 2019-04-29 ENCOUNTER — Telehealth: Payer: Self-pay | Admitting: Oncology

## 2019-04-29 DIAGNOSIS — M6281 Muscle weakness (generalized): Secondary | ICD-10-CM | POA: Diagnosis not present

## 2019-04-29 DIAGNOSIS — R262 Difficulty in walking, not elsewhere classified: Secondary | ICD-10-CM

## 2019-04-29 DIAGNOSIS — R293 Abnormal posture: Secondary | ICD-10-CM

## 2019-04-29 LAB — PROSTATE-SPECIFIC AG, SERUM (LABCORP): Prostate Specific Ag, Serum: 26.1 ng/mL — ABNORMAL HIGH (ref 0.0–4.0)

## 2019-04-29 NOTE — Telephone Encounter (Signed)
-----   Message from Wyatt Portela, MD sent at 04/29/2019  8:12 AM EDT ----- Please let him know his PSA is up slightly which is expected.

## 2019-04-29 NOTE — Telephone Encounter (Signed)
Called and spoke with daughter. Confirmed appt  °

## 2019-04-29 NOTE — Therapy (Addendum)
[facility] [address], [place], [place], [address] Phone: [phone]   Fax:  [phone]  Physical Therapy Treatment  Patient Details  Name: Michael Frey MRN: [id number] Date of Birth: 1941-08-30 Referring Provider (PT): Isaac Bliss, Rayford Halsted, MD   Encounter Date: 04/29/2019  PT End of Session - 04/29/19 1422    Visit Number  3    Date for PT Re-Evaluation  06/08/19    PT Start Time  1400    PT Stop Time  1440    PT Time Calculation (min)  40 min    Activity Tolerance  Patient tolerated treatment well    Behavior During Therapy  [facility] for tasks assessed/performed       Past Medical History:  Diagnosis Date  . Cancer ([place])   . Diabetes mellitus without complication [facility])     Past Surgical History:  Procedure Laterality Date  . CHOLECYSTECTOMY    . paratial pancrectomy    . PORTACATH PLACEMENT    . SPLENECTOMY      There were no vitals filed for this visit.  Subjective Assessment - 04/29/19 1404    Subjective  I have trouble  going down the steps. I use the cane and railing. I had chemotherapy yesterday. I feel good the first few days after chemotherapy.    Pertinent History  under chemo treatments currently    Limitations  Walking    Currently in Pain?  No/denies         [facility] PT Assessment - 04/29/19 0001      Assessment   Medical Diagnosis  R53.81 (ICD-10-CM) - Physical deconditioning    Referring Provider (PT)  Isaac Bliss, Rayford Halsted, MD    Onset Date/Surgical Date  --   since chemo; last week worsening   Prior Therapy  No      Precautions   Precautions  None      Strength   Right Hip Flexion  4+/5    Right Hip External Rotation   4-/5    Right Hip Internal Rotation  4+/5    Right Hip ABduction  4+/5    Left Hip Flexion  4-/5    Left Hip External Rotation  4/5    Left Hip Internal Rotation  4/5    Left Hip ABduction  4/5    Right Knee Flexion  5/5    Right Knee  Extension  5/5    Left Knee Flexion  4-/5    Left Knee Extension  4-/5    Right Ankle Dorsiflexion  3+/5                   [facility] Adult PT Treatment/Exercise - 04/29/19 0001      Self-Care   Self-Care  Other Self-Care Comments    Other Self-Care Comments   Discussed with patient on using elastic shoe laces instead of tying the shoes loosely to prevent falls. Patient is not walking everyday. PT instructed patient to walk daily 5 minutes 2 times per day in the house so he can ist if he needs too      Lumbar Exercises: Stretches   Active Hamstring Stretch  Right;Left;3 reps;20 seconds   sitting   Active Hamstring Stretch Limitations  assist with knee extension    Gastroc Stretch  Right;Left;3 reps;20 seconds   standing     Lumbar Exercises: Aerobic   Nustep  L1 x 10 min   seat 8; UE 8  Lumbar Exercises: Machines for Strengthening   Cybex Knee Flexion  seat #7, both legs 40# 10x3      Lumbar Exercises: Seated   Long Arc Quad on Chair  Strengthening;Right;Left;2 sets;10 reps;Weights    LAQ on Chair Weights (lbs)  2    Other Seated Lumbar Exercises  clam - red band - 2x10    Other Seated Lumbar Exercises  hip flexion 2# 5x5               PT Short Term Goals - 04/29/19 1452      PT SHORT TERM GOAL #1   Title  ind with initial HEP    Time  3    Period  Weeks    Status  On-going    Target Date  05/04/19      PT SHORT TERM GOAL #2   Title  5 x sit to stand < or = to 23 seconds    Time  4    Status  On-going    Target Date  05/11/19        PT Long Term Goals - 04/13/19 1538      PT LONG TERM GOAL #1   Title  Pt will be able to walk 800 feet in 6 min walk test    Baseline  540 ft    Time  8    Period  Weeks    Status  New    Target Date  06/08/19      PT LONG TERM GOAL #2   Title  Pt will perform TUG in < or = to 13 sec for reduced risk of falls    Time  8    Period  Weeks    Status  New    Target Date  06/08/19      PT LONG TERM GOAL #3    Title  Pt will report he is able to get up the stairs at home with 50% less difficulty    Time  8    Period  Weeks    Status  New    Target Date  06/08/19      PT LONG TERM GOAL #4   Title  Pt will be ind with advanced HEP    Time  8    Period  Weeks    Status  New    Target Date  06/08/19            Plan - 04/29/19 1423    Clinical Impression Statement  Patient did the leg press for the first time and needed verbal cues to go slowly. Patient was very thirsty with exercise so he had several cups of water. Patient had no pain with exercise. Patient ties his shoes loosely so it is easier to put on. Therapist educated patient about the risk of falls with his shoes slipping on his heel with steps. Patient was not fatiqued after therapy. Patient will benefit from skilled therapy to improve strength and edurance.    Personal Factors and Comorbidities  Age;Comorbidity 2    Comorbidities  history of falls, chemo    Examination-Activity Limitations  Bed Mobility;Locomotion Level    Examination-Participation Restrictions  Community Activity;Meal Prep    Rehab Potential  Good    PT Frequency  2x / week    PT Duration  8 weeks    PT Treatment/Interventions  ADLs/Self Care Home Management;Cryotherapy;Dentist;Therapeutic activities;Therapeutic exercise;Neuromuscular re-education;Patient/family education;Manual techniques;Passive range of motion;Taping  PT Next Visit Plan  Patient will be gone for 1-2 weeks; Patient  likes to work on the machines to increase strength; work on hamstring length due to walking with flexed knees    PT Home Exercise Plan  [id number]     Recommended Other Services  MD signed intial eval    Consulted and Agree with Plan of Care  Patient       Patient will benefit from skilled therapeutic intervention in order to improve the following deficits and impairments:  Abnormal gait, Decreased endurance, Difficulty  walking, Increased muscle spasms  Visit Diagnosis: 1. Muscle weakness (generalized)   2. Difficulty in walking, not elsewhere classified   3. Abnormal posture        Problem List Patient Active Problem List   Diagnosis Date Noted  . Hospital acquired PNA 03/28/2019  . Anemia associated with chemotherapy 03/28/2019  . Melena 03/28/2019  . Neutropenia ([place]) 03/28/2019  . Epistaxis 03/28/2019  . PNA (pneumonia) 03/28/2019  . Pressure injury of skin 03/23/2019  . Pulmonary embolus ([place]) 03/22/2019  . DM (diabetes mellitus), type 2 with complications ([place]) 83/35/8251  . Dehydration 03/22/2019  . CAD (coronary artery disease) 03/22/2019  . Leukocytosis 03/22/2019  . Port-A-Cath in place 03/19/2019  . 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 Prostate cancer [facility]) 02/04/2019    Earlie Counts, PT 04/29/19 2:53 PM   [facility] [address], [place], [place], [address] Phone: [phone]   Fax:  [phone]  Name: Michael Frey MRN: [id number] Date of Birth: December 17, 1940  PHYSICAL THERAPY DISCHARGE SUMMARY  Visits from Start of Care: 3  Current functional level related to goals / functional outcomes: See above.  Remaining deficits: See above. Patient went to [place] to stay with family due to his [place] family had [place]. Patient had to go to the hospital in [place] due to possible COVID.    Education / Equipment: HEP  Plan:                                                    Patient goals were not met. Patient is being discharged due to a change in medical status.  Thank you for the referral. Earlie Counts, PT 05/11/19 8:55 AM  ?????

## 2019-04-29 NOTE — Telephone Encounter (Signed)
Called and spoke to patient's daughter Jari Sportsman and made her aware that per Dr. Alen Blew, patient's PSA is slightly up which is expected. Barnett Applebaum verbalized understanding and will let her father know.

## 2019-04-30 ENCOUNTER — Inpatient Hospital Stay: Payer: Medicare Other

## 2019-04-30 ENCOUNTER — Inpatient Hospital Stay: Payer: Medicare Other | Admitting: Oncology

## 2019-05-03 ENCOUNTER — Encounter: Payer: Medicare Other | Admitting: Physical Therapy

## 2019-05-05 ENCOUNTER — Encounter: Payer: Medicare Other | Admitting: Physical Therapy

## 2019-05-06 ENCOUNTER — Telehealth: Payer: Self-pay

## 2019-05-06 ENCOUNTER — Other Ambulatory Visit: Payer: Self-pay

## 2019-05-06 ENCOUNTER — Encounter: Payer: Self-pay | Admitting: Oncology

## 2019-05-06 MED ORDER — APIXABAN 5 MG PO TABS: 5.0000 mg | ORAL_TABLET | Freq: Two times a day (BID) | ORAL | 0 refills | Status: AC

## 2019-05-06 MED ORDER — APIXABAN 5 MG PO TABS: 5.0000 mg | ORAL_TABLET | Freq: Two times a day (BID) | ORAL | 0 refills | Status: DC

## 2019-05-06 NOTE — Telephone Encounter (Signed)
-----   Message from Wyatt Portela, MD sent at 05/06/2019  3:22 PM EDT ----- Regarding: RE: My chart message Please let them to continue. You can send Rx if needed. Thanks.  ----- Message ----- From: Tami Lin, RN Sent: 05/06/2019   3:08 PM EDT To: Wyatt Portela, MD Subject: My chart message                               This message was sent via my chart. My father, Bern Fare was prescribed Eliquis for blood clots while hospitalized. He is out of the medication but we are unsure if he should continue to take this or if this was a short term medication. Please advise. Thank you - Jari Sportsman

## 2019-05-07 ENCOUNTER — Encounter: Payer: Self-pay | Admitting: Medical Oncology

## 2019-05-10 ENCOUNTER — Encounter: Payer: Self-pay | Admitting: Medical Oncology

## 2019-05-11 ENCOUNTER — Encounter: Payer: Self-pay | Admitting: Medical Oncology

## 2019-05-11 NOTE — Progress Notes (Signed)
Gina-daughter sent  message via My-Chart to inform me and Dr. Alen Blew, her dad was taken by ambulance to [facility] in [place], [place]. Her family was sick with Covid so she sent her dad to stay with a relative in [place]. She states,he  has gone downhill since leaving my house and my feeling is that he is giving up. We don't know yet if he is positive for Covid but he was having some difficulty breathing. I just wanted to update you.

## 2019-05-11 NOTE — Progress Notes (Signed)
Michael Frey send an update on her father to inform us he did test positive for COVID-19. He currently is in [place] and not doing well.

## 2019-05-19 ENCOUNTER — Ambulatory Visit: Payer: Medicare Other | Admitting: Internal Medicine

## 2019-05-19 ENCOUNTER — Telehealth: Payer: Self-pay

## 2019-05-19 MED ORDER — GENERIC EXTERNAL MEDICATION
Status: DC
Start: ? — End: 2019-05-19

## 2019-05-19 MED ORDER — GENERIC EXTERNAL MEDICATION
2.00 | Status: DC
Start: ? — End: 2019-05-19

## 2019-05-19 MED ORDER — LORAZEPAM 2 MG/ML IJ SOLN
1.00 | INTRAMUSCULAR | Status: DC
Start: ? — End: 2019-05-19

## 2019-05-19 MED ORDER — PHENOBARBITAL SODIUM 65 MG/ML IJ SOLN
65.00 | INTRAMUSCULAR | Status: DC
Start: ? — End: 2019-05-19

## 2019-05-19 MED ORDER — MORPHINE SULFATE 4 MG/ML IJ SOLN
4.00 | INTRAMUSCULAR | Status: DC
Start: ? — End: 2019-05-19

## 2019-05-19 MED ORDER — GENERIC EXTERNAL MEDICATION
2.00 | Status: DC
Start: 2019-05-18 — End: 2019-05-19

## 2019-05-19 NOTE — Telephone Encounter (Signed)
Received a call from Jari Sportsman stating that the patient died r/t COVID.

## 2019-05-21 ENCOUNTER — Inpatient Hospital Stay: Payer: Medicare Other

## 2019-05-21 ENCOUNTER — Inpatient Hospital Stay: Payer: Medicare Other | Admitting: Oncology

## 2019-06-05 DEATH — deceased

## 2020-04-14 IMAGING — CT CT ANGIOGRAPHY CHEST
2 of 6 series · 18 of 46 positions shown · IV contrast (ISOVUE)
Comparison: Chest x-ray earlier today. CT chest 02/09/2019

CLINICAL DATA: LEFT chest pain and shortness of breath since 1733
hours. Hyperglycemia and dizziness.

EXAM:
CT ANGIOGRAPHY CHEST WITH CONTRAST
TECHNIQUE: Multidetector CT imaging of the chest was performed using the
standard protocol during bolus administration of intravenous
contrast. Multiplanar CT image reconstructions and MIPs were
obtained to evaluate the vascular anatomy.
CONTRAST:  50mL OMNIPAQUE IOHEXOL 350 MG/ML SOLN

[Series 5: thins · axial · 0.77mm/px · z∈[+1383,+1649]mm · 15 of 292 slices shown]
[im 13/292  lung]
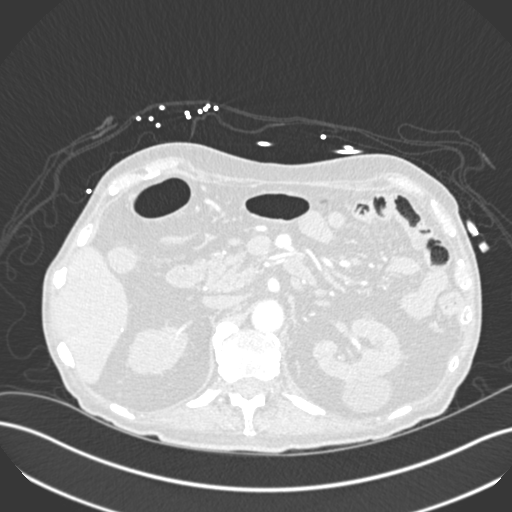
[im 38/292  soft-tissue]
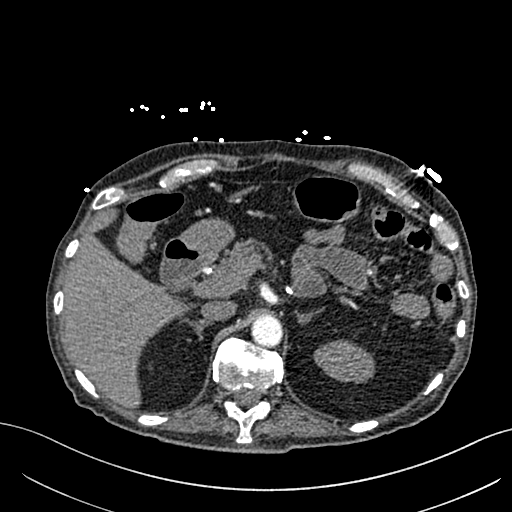
[im 51/292  lung]
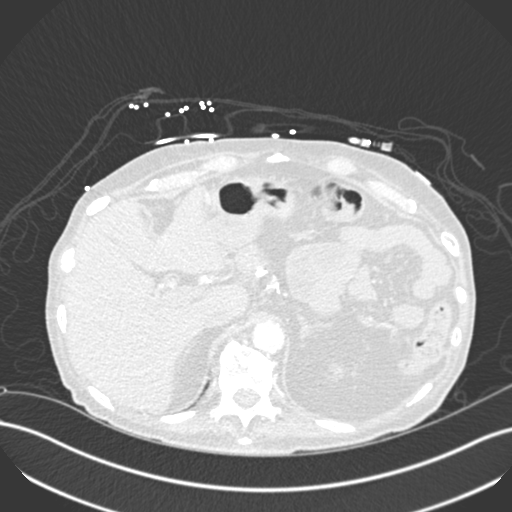
[im 76/292  soft-tissue]
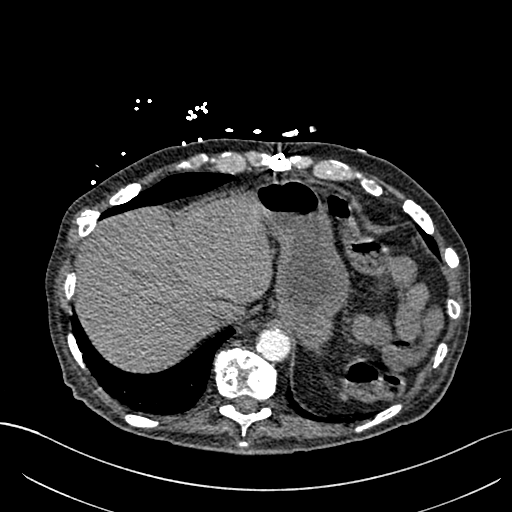
[im 89/292  lung]
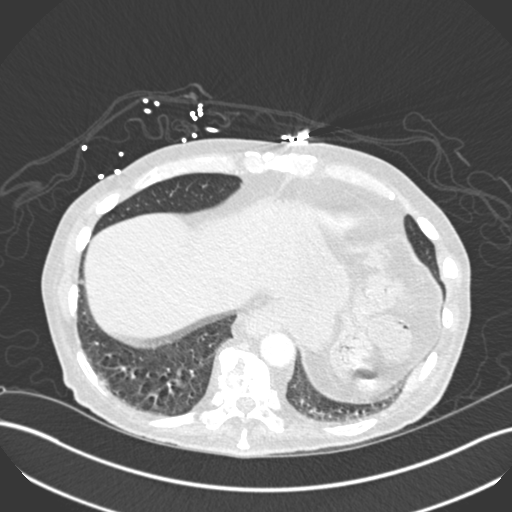
[im 114/292  soft-tissue]
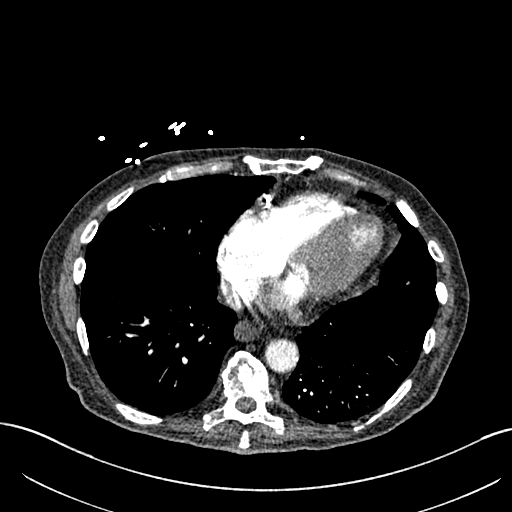
[im 127/292  lung]
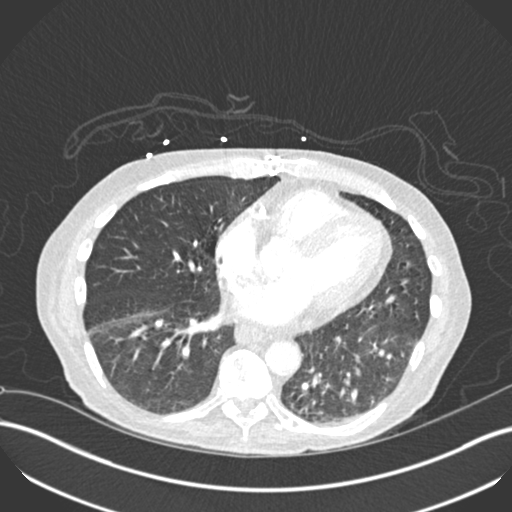
[im 152/292  soft-tissue]
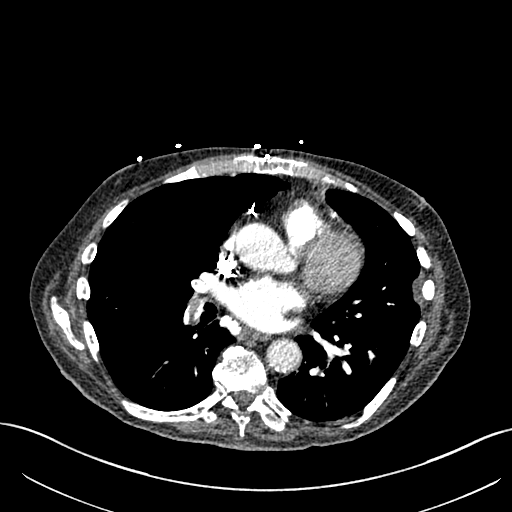
[im 165/292  lung]
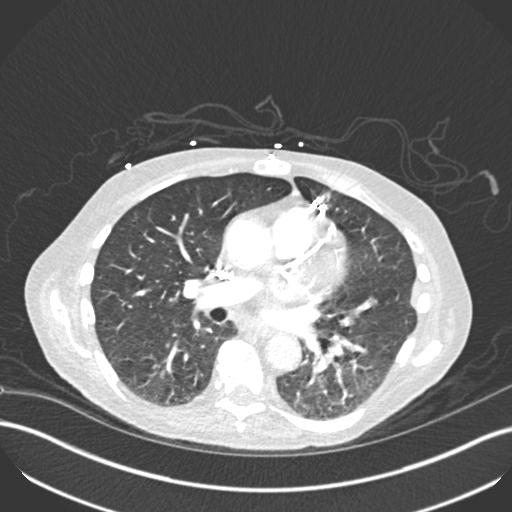
[im 178/292  soft-tissue]
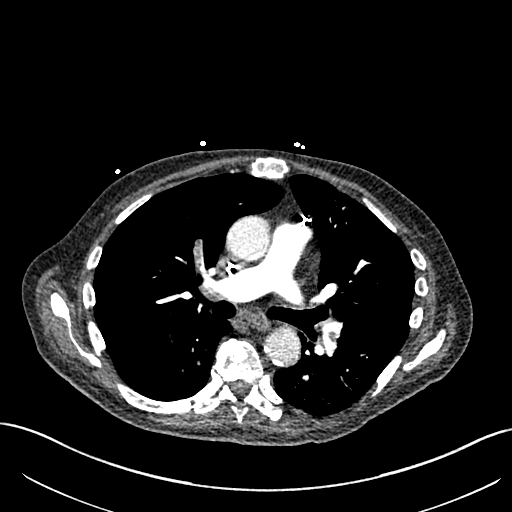
[im 203/292  lung]
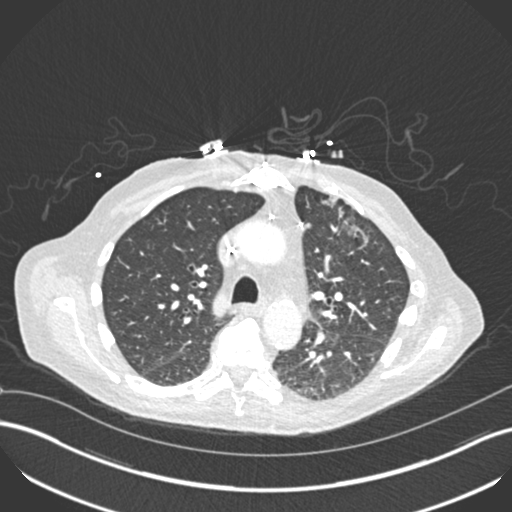
[im 216/292  soft-tissue]
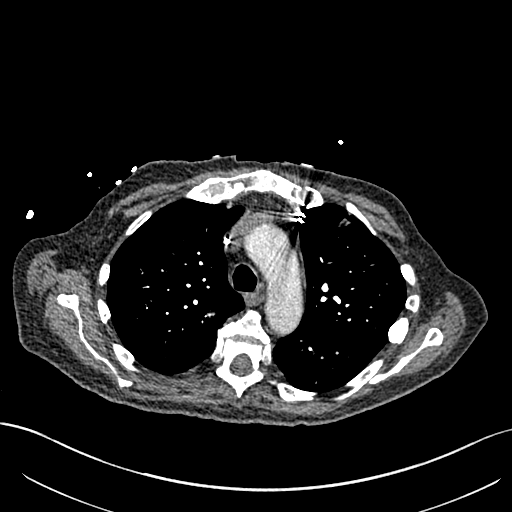
[im 241/292  lung]
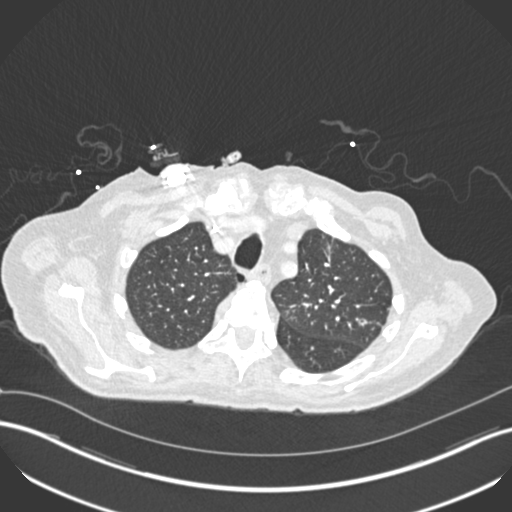
[im 254/292  soft-tissue]
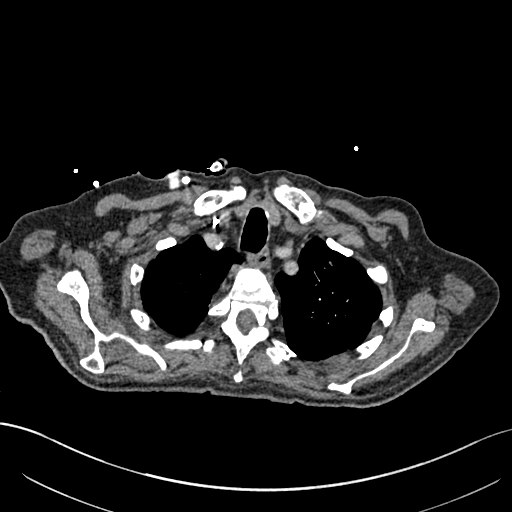
[im 279/292  lung]
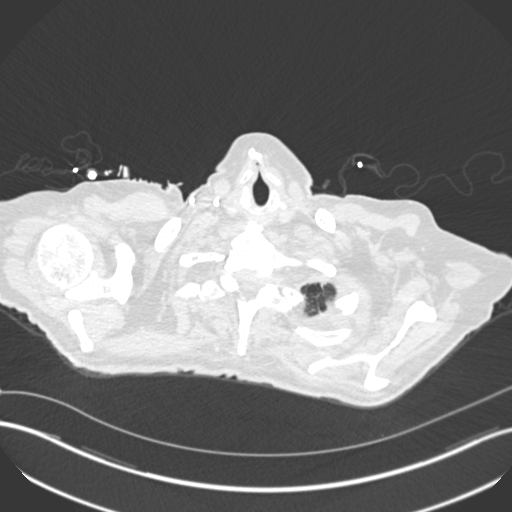

[Series 7: coronal mpr · coronal · 0.60mm/px · 3 of 124 slices shown]
[im 31/124  soft-tissue]
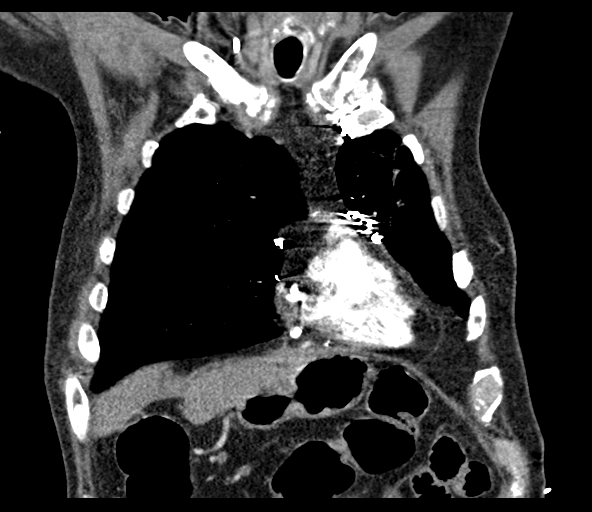
[im 62/124  soft-tissue]
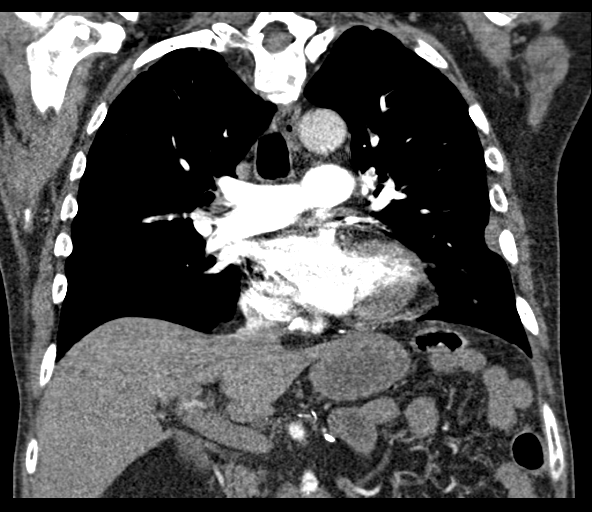
[im 93/124  soft-tissue]
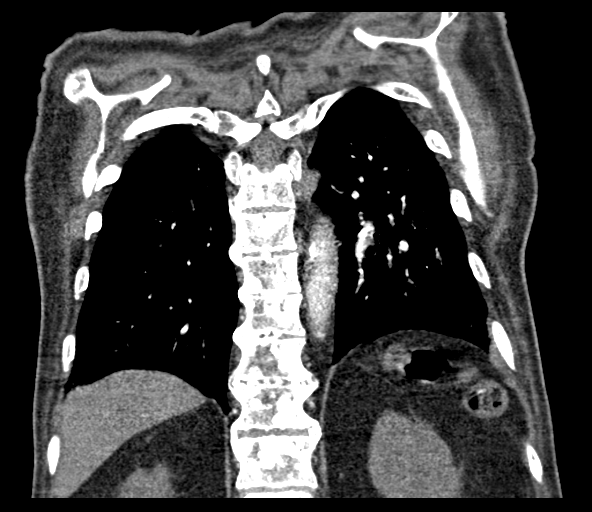

[18 of 46 positions shown; findings below may reference images not displayed]

FINDINGS: Cardiovascular: Satisfactory opacification of the pulmonary arteries
to the segmental level. BILATERAL pulmonary lower lobe emboli are
present, more extensive on the RIGHT. No signs of RIGHT heart
strain. Extensive three-vessel coronary artery calcification. Median
sternotomy for CABG. RIGHT chest port catheter. Aortic
atherosclerosis. Normal heart size. No pericardial effusion.

Mediastinum/Nodes: No enlarged mediastinal, hilar, or axillary lymph
nodes. Thyroid gland, trachea, and esophagus demonstrate no
significant findings.

Lungs/Pleura: Redemonstrated are numerous small pulmonary nodules,
most conspicuous in the LEFT upper lobe and superior segment RIGHT
lower lobe, with some cavitation in the LEFT upper lobe. No pleural
effusion or pneumothorax. These appear slightly improved compared
with priors.

Upper Abdomen: No acute abnormality.

Musculoskeletal: Innumerable sclerotic osseous lesions, nonacute
appearing RIGHT-sided rib fractures.

Review of the MIP images confirms the above findings.
IMPRESSION: 1. BILATERAL pulmonary emboli, greater on the RIGHT.
2. Widespread osseous metastatic disease, secondary to prostate
cancer.
3. BILATERAL pulmonary nodules, appears slightly improved compared
with priors. Atypical infection is favored.
4. These results were called by telephone at the time of
interpretation on 03/22/2019 at [DATE] to Dr. STRELSKI KLUB SHENDETI , who
verbally acknowledged these results.

Aortic Atherosclerosis ([id number]).

## 2020-04-14 IMAGING — CR CHEST - 2 VIEW
2 series · 2 of 2 positions shown · non-contrast
Comparison: Chest x-ray 02/26/2019, CT 02/09/2019

CLINICAL DATA: 78-year-old male with shortness of breath and
fatigue

EXAM:
CHEST - 2 VIEW

[w chest pa]
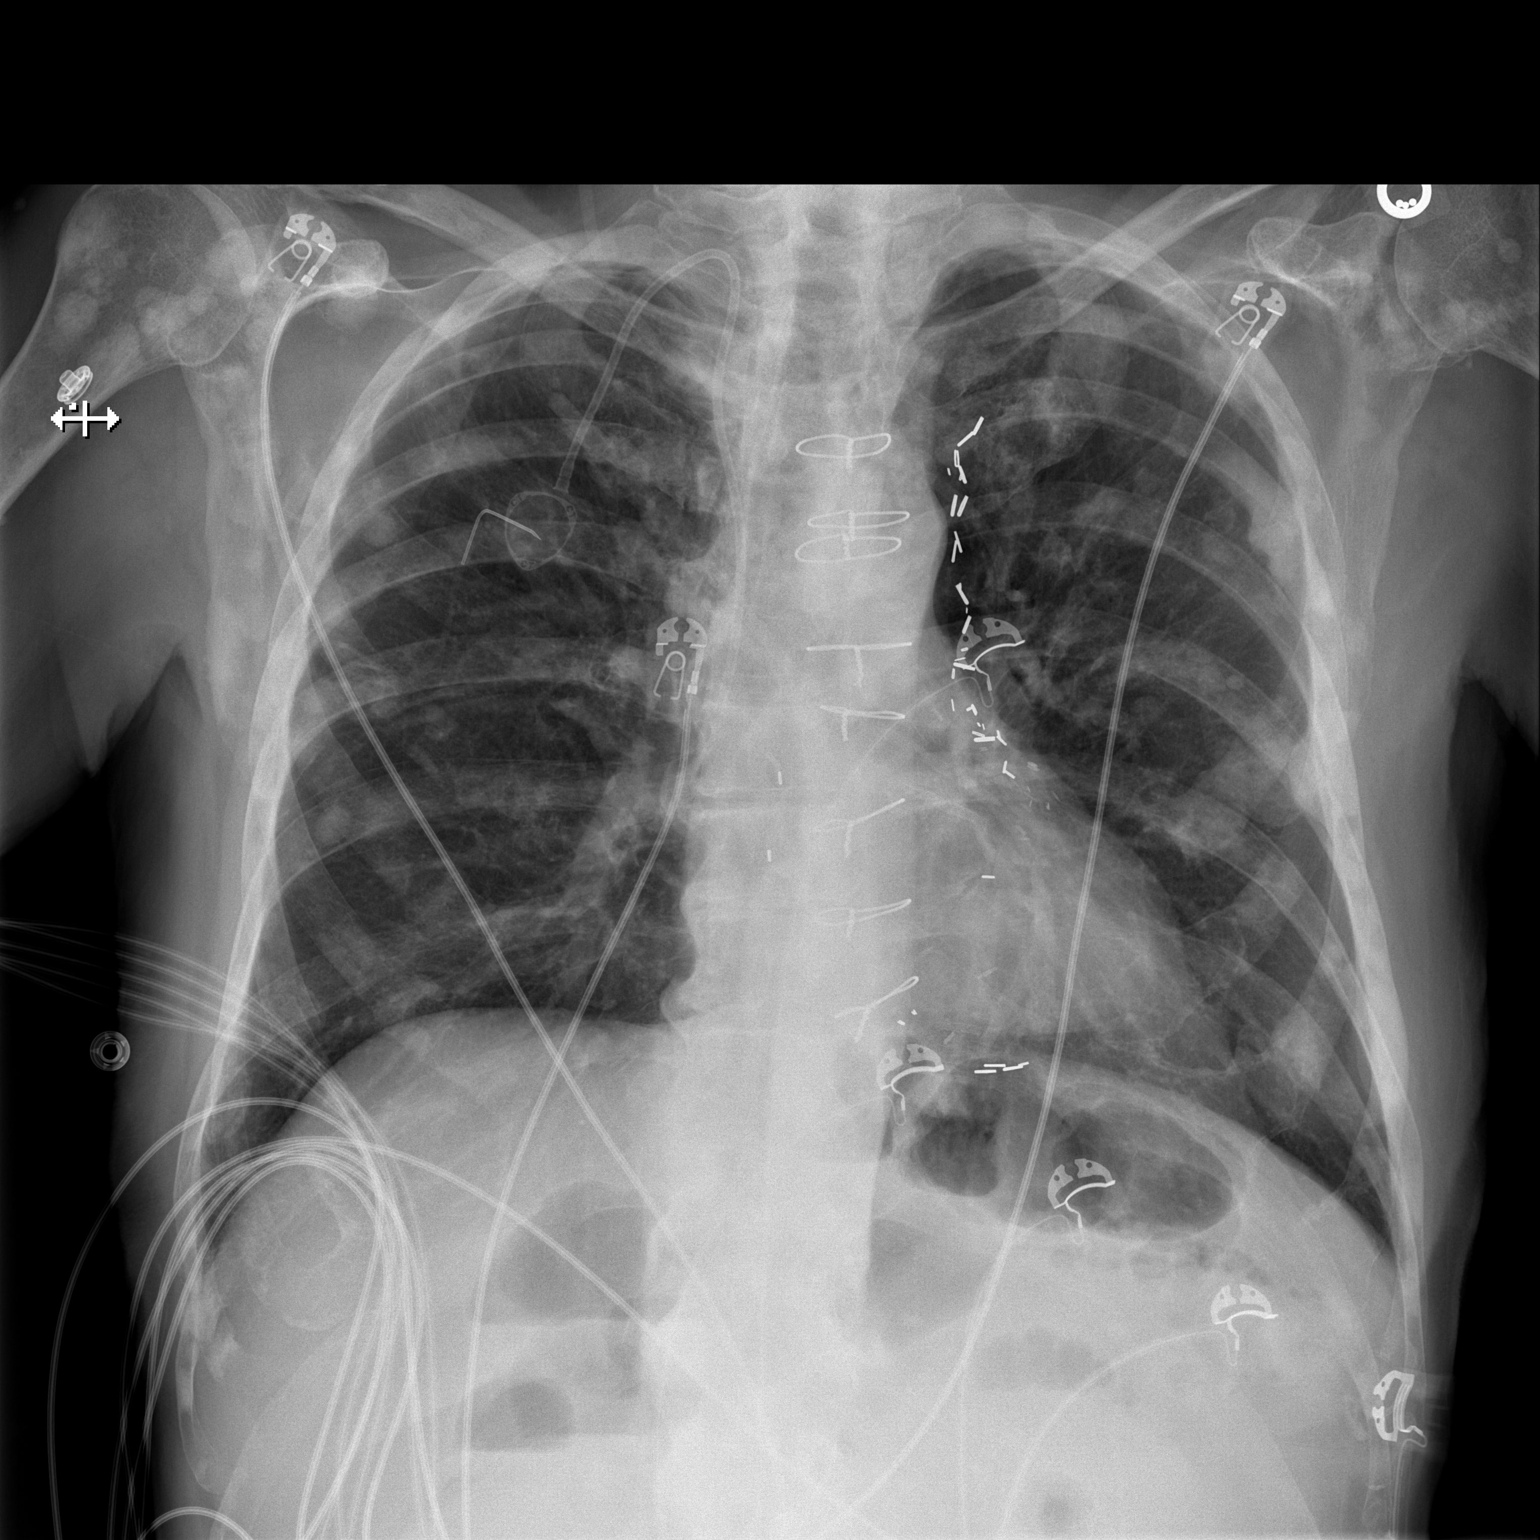

[w chest lat]
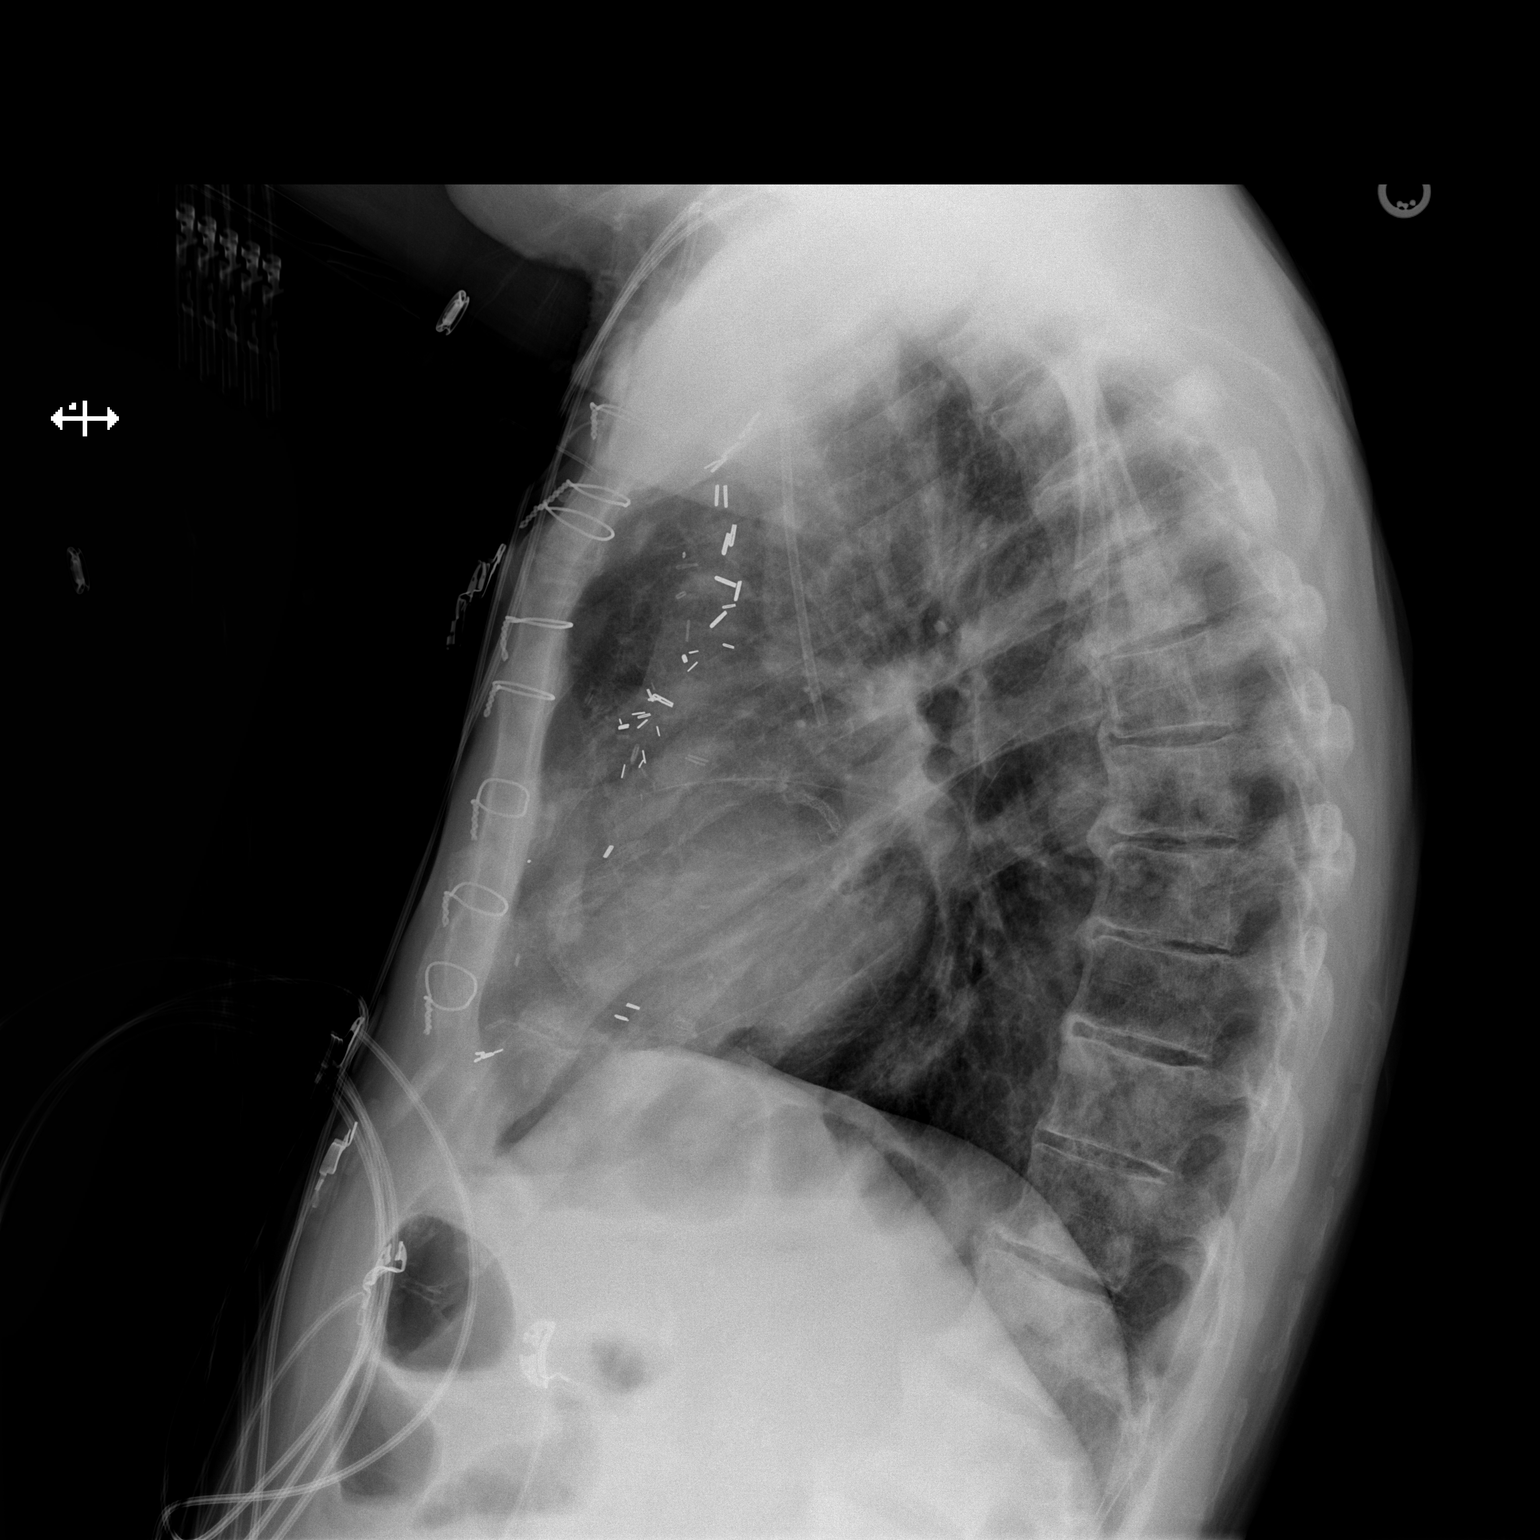

[2 of 2 positions shown; findings below may reference images not displayed]

FINDINGS: Cardiomediastinal silhouette unchanged in size and contour. Surgical
changes of median sternotomy and CABG.

Right IJ port catheter is unchanged.

Sclerotic foci of the visualized appendicular and axial skeleton,
similar to the comparison CT and plain film.

No pneumothorax or pleural effusion.  No confluent airspace disease.

Lung nodules are better characterized on recent CT imaging, not
visualized on the current study.
IMPRESSION: Negative for acute cardiopulmonary disease.

Redemonstration of sclerotic metastases of the visualized axial and
appendicular skeleton.

Right IJ port catheter.

Surgical changes of median sternotomy and CABG.

## 2020-04-20 IMAGING — DX PORTABLE CHEST - 1 VIEW
1 series · 1 of 1 positions shown · non-contrast
Comparison: Chest radiographs and CTA 03/22/2019

CLINICAL DATA: Shortness of breath. Emesis, diarrhea, and
epistaxis.

EXAM:
PORTABLE CHEST 1 VIEW

[chest ap]
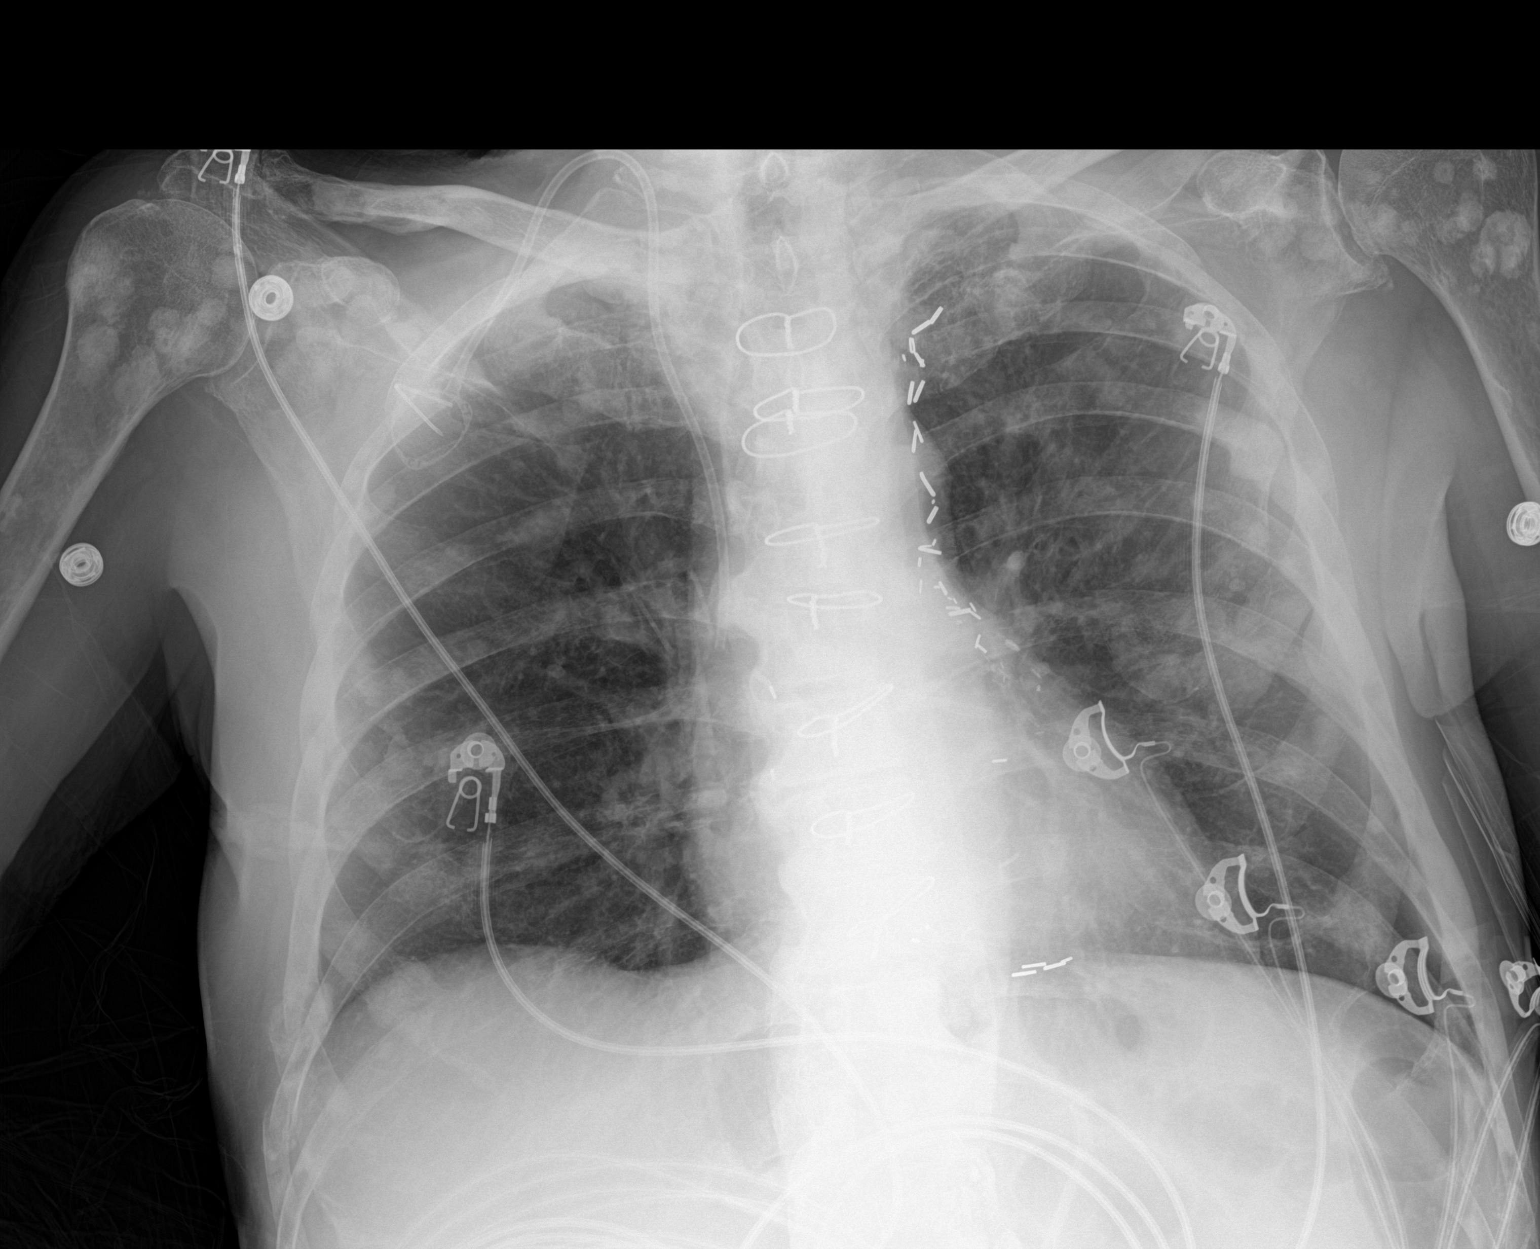

[1 of 1 positions shown; findings below may reference images not displayed]

FINDINGS: A right jugular Port-A-Cath terminates over the lower SVC,
unchanged. The cardiomediastinal silhouette is unchanged with normal
heart size. Sequelae of CABG are again identified. Left upper lobe
nodular densities on CT are partially obscured radiographically due
to innumerable sclerotic bone metastases including involvement of
the ribs and scapulae which result in bone lesions projecting over
the lungs. Mildly increased patchy airspace opacity is questioned in
the left mid lung. No sizable pleural effusion or pneumothorax is
identified.
IMPRESSION: 1. Questionable new/increased infiltrate in the left midlung.
2. Diffuse sclerotic bone metastases.

## 2023-09-24 NOTE — Telephone Encounter (Signed)
Telephone call
